# Patient Record
Sex: Female | Born: 1945
Health system: Southern US, Community
[De-identification: ages and names within clinical notes are randomized; demographics above are authoritative.]

## PROBLEM LIST (undated history)

## (undated) DIAGNOSIS — I10 Essential (primary) hypertension: Secondary | ICD-10-CM

## (undated) DIAGNOSIS — E785 Hyperlipidemia, unspecified: Secondary | ICD-10-CM

## (undated) DIAGNOSIS — IMO0002 Reserved for concepts with insufficient information to code with codable children: Secondary | ICD-10-CM

## (undated) DIAGNOSIS — R319 Hematuria, unspecified: Secondary | ICD-10-CM

## (undated) DIAGNOSIS — R3915 Urgency of urination: Secondary | ICD-10-CM

## (undated) DIAGNOSIS — K529 Noninfective gastroenteritis and colitis, unspecified: Secondary | ICD-10-CM

## (undated) DIAGNOSIS — J449 Chronic obstructive pulmonary disease, unspecified: Secondary | ICD-10-CM

## (undated) DIAGNOSIS — I493 Ventricular premature depolarization: Secondary | ICD-10-CM

## (undated) DIAGNOSIS — N993 Prolapse of vaginal vault after hysterectomy: Secondary | ICD-10-CM

## (undated) DIAGNOSIS — Z8551 Personal history of malignant neoplasm of bladder: Secondary | ICD-10-CM

## (undated) HISTORY — DX: Ventricular premature depolarization: I49.3

## (undated) HISTORY — DX: Chronic obstructive pulmonary disease, unspecified: J44.9

## (undated) HISTORY — DX: Personal history of malignant neoplasm of bladder: Z85.51

## (undated) HISTORY — PX: BREAST BIOPSY: SHX20

## (undated) HISTORY — DX: Noninfective gastroenteritis and colitis, unspecified: K52.9

## (undated) HISTORY — DX: Essential (primary) hypertension: I10

## (undated) HISTORY — DX: Prolapse of vaginal vault after hysterectomy: N99.3

## (undated) HISTORY — PX: ABDOMINAL HYSTERECTOMY: SHX81

---

## 1979-03-30 HISTORY — PX: BUNIONECTOMY: SHX129

## 1982-07-29 HISTORY — PX: TUBAL LIGATION: SHX77

## 2001-02-19 ENCOUNTER — Other Ambulatory Visit: Admission: RE | Admit: 2001-02-19 | Discharge: 2001-02-19 | Payer: Self-pay | Admitting: Obstetrics and Gynecology

## 2001-07-27 ENCOUNTER — Encounter: Payer: Self-pay | Admitting: Obstetrics and Gynecology

## 2001-07-27 ENCOUNTER — Ambulatory Visit (HOSPITAL_COMMUNITY): Admission: RE | Admit: 2001-07-27 | Discharge: 2001-07-27 | Payer: Self-pay | Admitting: Obstetrics and Gynecology

## 2001-08-05 ENCOUNTER — Encounter: Payer: Self-pay | Admitting: Obstetrics and Gynecology

## 2001-08-05 ENCOUNTER — Ambulatory Visit (HOSPITAL_COMMUNITY): Admission: RE | Admit: 2001-08-05 | Discharge: 2001-08-05 | Payer: Self-pay | Admitting: Obstetrics and Gynecology

## 2002-07-13 ENCOUNTER — Ambulatory Visit (HOSPITAL_COMMUNITY): Admission: RE | Admit: 2002-07-13 | Discharge: 2002-07-13 | Payer: Self-pay | Admitting: Obstetrics and Gynecology

## 2002-07-13 ENCOUNTER — Encounter: Payer: Self-pay | Admitting: Obstetrics and Gynecology

## 2003-07-15 ENCOUNTER — Ambulatory Visit (HOSPITAL_COMMUNITY): Admission: RE | Admit: 2003-07-15 | Discharge: 2003-07-15 | Payer: Self-pay | Admitting: Obstetrics and Gynecology

## 2004-03-13 ENCOUNTER — Ambulatory Visit (HOSPITAL_COMMUNITY): Admission: RE | Admit: 2004-03-13 | Discharge: 2004-03-13 | Payer: Self-pay | Admitting: Pulmonary Disease

## 2004-07-17 ENCOUNTER — Ambulatory Visit (HOSPITAL_COMMUNITY): Admission: RE | Admit: 2004-07-17 | Discharge: 2004-07-17 | Payer: Self-pay | Admitting: Pulmonary Disease

## 2005-07-18 ENCOUNTER — Ambulatory Visit (HOSPITAL_COMMUNITY): Admission: RE | Admit: 2005-07-18 | Discharge: 2005-07-18 | Payer: Self-pay | Admitting: Obstetrics and Gynecology

## 2006-07-28 ENCOUNTER — Ambulatory Visit (HOSPITAL_COMMUNITY): Admission: RE | Admit: 2006-07-28 | Discharge: 2006-07-28 | Payer: Self-pay | Admitting: Obstetrics and Gynecology

## 2006-10-02 ENCOUNTER — Ambulatory Visit (HOSPITAL_COMMUNITY): Admission: RE | Admit: 2006-10-02 | Discharge: 2006-10-02 | Payer: Self-pay | Admitting: Pulmonary Disease

## 2007-07-29 ENCOUNTER — Other Ambulatory Visit: Admission: RE | Admit: 2007-07-29 | Discharge: 2007-07-29 | Payer: Self-pay | Admitting: Obstetrics and Gynecology

## 2007-07-31 ENCOUNTER — Ambulatory Visit (HOSPITAL_COMMUNITY): Admission: RE | Admit: 2007-07-31 | Discharge: 2007-07-31 | Payer: Self-pay | Admitting: Pulmonary Disease

## 2008-08-03 ENCOUNTER — Other Ambulatory Visit: Admission: RE | Admit: 2008-08-03 | Discharge: 2008-08-03 | Payer: Self-pay | Admitting: Obstetrics and Gynecology

## 2008-08-15 ENCOUNTER — Ambulatory Visit (HOSPITAL_COMMUNITY): Admission: RE | Admit: 2008-08-15 | Discharge: 2008-08-15 | Payer: Self-pay | Admitting: Obstetrics and Gynecology

## 2009-08-28 ENCOUNTER — Ambulatory Visit (HOSPITAL_COMMUNITY): Admission: RE | Admit: 2009-08-28 | Discharge: 2009-08-28 | Payer: Self-pay | Admitting: Obstetrics & Gynecology

## 2009-12-11 ENCOUNTER — Ambulatory Visit (HOSPITAL_COMMUNITY): Admission: RE | Admit: 2009-12-11 | Discharge: 2009-12-11 | Payer: Self-pay | Admitting: Pulmonary Disease

## 2010-01-01 ENCOUNTER — Ambulatory Visit (HOSPITAL_COMMUNITY): Admission: RE | Admit: 2010-01-01 | Discharge: 2010-01-01 | Payer: Self-pay | Admitting: Pulmonary Disease

## 2010-06-15 ENCOUNTER — Other Ambulatory Visit: Admission: RE | Admit: 2010-06-15 | Discharge: 2010-06-15 | Payer: Self-pay | Admitting: Obstetrics and Gynecology

## 2010-08-07 ENCOUNTER — Ambulatory Visit (HOSPITAL_COMMUNITY)
Admission: RE | Admit: 2010-08-07 | Discharge: 2010-08-07 | Payer: Self-pay | Source: Home / Self Care | Attending: Pulmonary Disease | Admitting: Pulmonary Disease

## 2010-08-16 ENCOUNTER — Other Ambulatory Visit (HOSPITAL_COMMUNITY): Payer: Self-pay | Admitting: Pulmonary Disease

## 2010-08-16 DIAGNOSIS — Z0000 Encounter for general adult medical examination without abnormal findings: Secondary | ICD-10-CM

## 2010-08-29 ENCOUNTER — Ambulatory Visit (HOSPITAL_COMMUNITY): Payer: Self-pay

## 2010-08-30 ENCOUNTER — Ambulatory Visit (HOSPITAL_COMMUNITY): Admission: RE | Admit: 2010-08-30 | Payer: Medicare Other | Source: Home / Self Care | Admitting: Pulmonary Disease

## 2010-08-30 ENCOUNTER — Ambulatory Visit (HOSPITAL_COMMUNITY)
Admission: RE | Admit: 2010-08-30 | Discharge: 2010-08-30 | Disposition: A | Discharge status: Home / Self Care | Payer: Medicare Other | Source: Ambulatory Visit | Attending: Pulmonary Disease | Admitting: Pulmonary Disease

## 2010-08-30 DIAGNOSIS — Z0000 Encounter for general adult medical examination without abnormal findings: Secondary | ICD-10-CM

## 2010-08-30 DIAGNOSIS — Z1231 Encounter for screening mammogram for malignant neoplasm of breast: Secondary | ICD-10-CM | POA: Insufficient documentation

## 2011-01-31 ENCOUNTER — Emergency Department (HOSPITAL_COMMUNITY)
Admission: EM | Admit: 2011-01-31 | Discharge: 2011-01-31 | Disposition: A | Discharge status: Home / Self Care | Payer: No Typology Code available for payment source | Attending: Emergency Medicine | Admitting: Emergency Medicine

## 2011-01-31 ENCOUNTER — Emergency Department (HOSPITAL_COMMUNITY): Payer: No Typology Code available for payment source

## 2011-01-31 ENCOUNTER — Other Ambulatory Visit (HOSPITAL_COMMUNITY): Payer: Self-pay | Admitting: Pulmonary Disease

## 2011-01-31 DIAGNOSIS — M25559 Pain in unspecified hip: Secondary | ICD-10-CM | POA: Insufficient documentation

## 2011-01-31 DIAGNOSIS — E119 Type 2 diabetes mellitus without complications: Secondary | ICD-10-CM | POA: Insufficient documentation

## 2011-01-31 DIAGNOSIS — I10 Essential (primary) hypertension: Secondary | ICD-10-CM | POA: Insufficient documentation

## 2011-01-31 DIAGNOSIS — N63 Unspecified lump in unspecified breast: Secondary | ICD-10-CM

## 2011-01-31 DIAGNOSIS — M25569 Pain in unspecified knee: Secondary | ICD-10-CM | POA: Insufficient documentation

## 2011-01-31 DIAGNOSIS — M25519 Pain in unspecified shoulder: Secondary | ICD-10-CM | POA: Insufficient documentation

## 2011-01-31 DIAGNOSIS — Y92410 Unspecified street and highway as the place of occurrence of the external cause: Secondary | ICD-10-CM | POA: Insufficient documentation

## 2011-02-06 ENCOUNTER — Other Ambulatory Visit (HOSPITAL_COMMUNITY): Payer: Self-pay | Admitting: Pulmonary Disease

## 2011-02-06 ENCOUNTER — Ambulatory Visit (HOSPITAL_COMMUNITY)
Admission: RE | Admit: 2011-02-06 | Discharge: 2011-02-06 | Disposition: A | Discharge status: Home / Self Care | Payer: Medicare Other | Source: Ambulatory Visit | Attending: Pulmonary Disease | Admitting: Pulmonary Disease

## 2011-02-06 DIAGNOSIS — N63 Unspecified lump in unspecified breast: Secondary | ICD-10-CM

## 2011-02-20 ENCOUNTER — Ambulatory Visit (HOSPITAL_COMMUNITY)
Admission: RE | Admit: 2011-02-20 | Discharge: 2011-02-20 | Disposition: A | Discharge status: Home / Self Care | Payer: Medicare Other | Source: Ambulatory Visit | Attending: Pulmonary Disease | Admitting: Pulmonary Disease

## 2011-02-20 ENCOUNTER — Encounter (HOSPITAL_COMMUNITY): Payer: Self-pay | Admitting: Physical Therapy

## 2011-02-20 DIAGNOSIS — M25559 Pain in unspecified hip: Secondary | ICD-10-CM | POA: Insufficient documentation

## 2011-02-20 DIAGNOSIS — R262 Difficulty in walking, not elsewhere classified: Secondary | ICD-10-CM | POA: Insufficient documentation

## 2011-02-20 DIAGNOSIS — I10 Essential (primary) hypertension: Secondary | ICD-10-CM | POA: Insufficient documentation

## 2011-02-20 DIAGNOSIS — IMO0001 Reserved for inherently not codable concepts without codable children: Secondary | ICD-10-CM | POA: Insufficient documentation

## 2011-02-20 DIAGNOSIS — M545 Low back pain, unspecified: Secondary | ICD-10-CM | POA: Insufficient documentation

## 2011-02-20 DIAGNOSIS — M6281 Muscle weakness (generalized): Secondary | ICD-10-CM | POA: Insufficient documentation

## 2011-02-20 DIAGNOSIS — M25551 Pain in right hip: Secondary | ICD-10-CM | POA: Insufficient documentation
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
## 2011-02-26 ENCOUNTER — Encounter (INDEPENDENT_AMBULATORY_CARE_PROVIDER_SITE_OTHER): Payer: Self-pay | Admitting: Surgery

## 2011-02-26 ENCOUNTER — Other Ambulatory Visit (INDEPENDENT_AMBULATORY_CARE_PROVIDER_SITE_OTHER): Payer: Self-pay | Admitting: Surgery

## 2011-02-26 ENCOUNTER — Ambulatory Visit (HOSPITAL_COMMUNITY)
Admission: RE | Admit: 2011-02-26 | Discharge: 2011-02-26 | Disposition: A | Discharge status: Home / Self Care | Payer: Medicare Other | Source: Ambulatory Visit | Attending: Pulmonary Disease | Admitting: Pulmonary Disease

## 2011-02-26 ENCOUNTER — Ambulatory Visit (INDEPENDENT_AMBULATORY_CARE_PROVIDER_SITE_OTHER): Payer: Medicare Other | Admitting: Surgery

## 2011-02-26 VITALS — BP 160/96 | HR 100 | Temp 97.5°F | Ht 65.0 in | Wt 158.2 lb

## 2011-02-26 DIAGNOSIS — N6320 Unspecified lump in the left breast, unspecified quadrant: Secondary | ICD-10-CM

## 2011-02-26 DIAGNOSIS — N63 Unspecified lump in unspecified breast: Secondary | ICD-10-CM | POA: Insufficient documentation

## 2011-02-26 DIAGNOSIS — K529 Noninfective gastroenteritis and colitis, unspecified: Secondary | ICD-10-CM | POA: Insufficient documentation

## 2011-02-26 DIAGNOSIS — E1122 Type 2 diabetes mellitus with diabetic chronic kidney disease: Secondary | ICD-10-CM | POA: Insufficient documentation

## 2011-02-26 DIAGNOSIS — I10 Essential (primary) hypertension: Secondary | ICD-10-CM | POA: Insufficient documentation

## 2011-02-26 NOTE — Progress Notes (Signed)
CC: Breast/chest wall mass HPI: This patient had a recent mammogram an abnormality was seen in the upper inner quadrant of the breast, and she'll most of the actual breast tissue. It was biopsied with a fine needle aspiration biopsy. A core biopsy couldn't be done because it was close to an artery. The biopsy showed some lymphatic tissue and there was some mild suspicion that this was abnormal so an excisional biopsy was recommended. She comes here to discuss that. She is not able to feel a mass. She's had no prior breast problems or symptoms.  ROS: Her 15 point review of systems was filled out and reviewed. Pertinent positives include some history of hypertension and type 2 diabetes. She's had a little bowel syndrome and possibly inflammatory bowel disease. She sits in sinus issues and worse partial plates.  MEDS: Current outpatient prescriptions:ALPRAZolam (XANAX) 1 MG tablet, Take 1 mg by mouth 3 (three) times daily as needed.  , Disp: , Rfl: ;  amLODipine (NORVASC) 10 MG tablet, Take 10 mg by mouth daily.  , Disp: , Rfl: ;  aspirin 81 MG EC tablet, Take 81 mg by mouth daily.  , Disp: , Rfl: ;  Calcium Carbonate-Vitamin D (CALCIUM 500 + D PO), Take by mouth 3 (three) times daily.  , Disp: , Rfl:  loratadine (CLARITIN) 5 MG chewable tablet, Chew 5 mg by mouth daily.  , Disp: , Rfl: ;  mesalamine (ASACOL) 400 MG EC tablet, Take 400 mg by mouth 2 (two) times daily.  , Disp: , Rfl: ;  metFORMIN (GLUCOPHAGE-XR) 500 MG 24 hr tablet, Take 500 mg by mouth daily with breakfast.  , Disp: , Rfl: ;  Multiple Vitamins-Minerals (CENTRUM SILVER PO), Take by mouth daily.  , Disp: , Rfl:  pravastatin (PRAVACHOL) 40 MG tablet, Take 40 mg by mouth daily.  , Disp: , Rfl: ;  ramipril (ALTACE) 10 MG capsule, Take 10 mg by mouth daily.  , Disp: , Rfl:        ALLERGIES: Allergies  Allergen Reactions  . Ceftin    Past Medical History  Diagnosis Date  . Low back pain   . Hip pain, right   . Fatigue   .  Hypertension   . IBD (inflammatory bowel disease)   . Allergy     Past Surgical History  Procedure Date  . Tubal ligation   . Bunionectomy       PE GENERAL: The patient is alert, oriented, and generally healthy-appearing, NAD. Mood and affect are normal.  HEENT: The head is normocephalic, the eyes nonicteric, the pupils were round regular and equal. EOMs are normal. Pharynx normal. Dentition good.  NECK: The neck is supple and there are no masses or thyromegaly.  LUNGS: Normal respirations and clear to auscultation.  HEART: Regular rhythm, with no murmurs rubs or gallops. Pulses are intact carotid dorsalis pedis and posterior tibial. No significant varicosities are noted.    BREASTS: Breasts are symmetrical appearance and there are no suspicious areas. The area in question from the biopsy as any anterior chest wall upper aspect of the breast and there is no palpable mass there.  ABDOMEN: Soft, flat, and nontender. No masses or organomegaly is noted. No hernias are noted. Bowel sounds are normal.  EXTREMITIES: Good range of motion, no edema.   Data Reviewed I have reviewed the notes from Dr. Verita Lamb office as well as her mammogram report ultrasound report and biopsy report  Assessment Lesion of left anterior chest/breast of uncertain etiology,  likely lymphatic in origin, hopefully been on  Plan Excisional biopsy is appropriate. This will need a guidewire or marking with ultrasound guidance to be sure we get the correct area absence nothing is palpable. She went ahead and set this up.

## 2011-02-26 NOTE — Progress Notes (Addendum)
Physical Therapy Treatment Patient Name: April Gomez [id number] Date: 02/26/2011  Time In: 8:47 Time Out: 9:50 Visit #: 2/2 Next Re-eval: 03/20/2011 Charge: Gait x 13 min Therex 35 min MHP 10 min  Subjective: Symptoms/Limitations Symptoms: 8/10 LBP/R hip radiating pain Pain Assessment Pain Score:   8 Pain Location: Back Pain Orientation: Right Multiple Pain Sites: Yes  Objective: Rigid stance, antalgic gait flat foot  Exercise/Treatments TM.8x 3' Gait for step thru pattern with vc for heel toe, equal stride length STANDING: Hay bale 5x SUPINE: Hs st 3x 30" LTK 5x 10" SKTC 5x 10" Bridge 10x Abd iso 5x5" SITTING: AP, RL hip rotation on theraball   Lumbar Stretches Active Hamstring Stretch: 3 reps;30 seconds Single Knee to Chest Stretch: 5 reps;30 seconds Lower Trunk Rotation: 5 reps;10 seconds Stability Exercises Clam: 10 reps;Supine Bridge: 10 reps Functional Squats: 10 reps Hip Stretches Active Hamstring Stretch: 3 reps;30 seconds Knee Stretches Active Hamstring Stretch: 3 reps;30 seconds    Goals   End of Session Patient Active Problem List  Diagnoses  . Low back pain  . Hip pain, right   PT - End of Session Activity Tolerance: Patient limited by pain General Behavior During Session: [facility] for tasks performed Cognition: [facility] for tasks performed PT Assessment and Plan Clinical Impression Statement: Pt with rigid posture with gait, multimodal cueing for proper sequence with gait.  Pt tolerated well with total tx with R LE radiating pain decrease following MHP at end of session. PT Plan: Add estim and/or manual techniques to decrease pain, add standing hip flexor strength, progress strength and reduce pain.  Juel Burrow 02/26/2011, 11:53 AM

## 2011-02-26 NOTE — Patient Instructions (Signed)
We will schedule surgery for you to have this area and the left breast chest wall area removed. This will be outpatient surgery. We'll send the tissue to the pathologist to find out what it is.

## 2011-02-27 ENCOUNTER — Telehealth (HOSPITAL_COMMUNITY): Payer: Self-pay | Admitting: Physical Therapy

## 2011-02-28 ENCOUNTER — Ambulatory Visit (HOSPITAL_COMMUNITY)
Admission: RE | Admit: 2011-02-28 | Discharge: 2011-02-28 | Disposition: A | Discharge status: Home / Self Care | Payer: No Typology Code available for payment source | Source: Ambulatory Visit | Attending: Pulmonary Disease | Admitting: Pulmonary Disease

## 2011-02-28 DIAGNOSIS — R262 Difficulty in walking, not elsewhere classified: Secondary | ICD-10-CM | POA: Insufficient documentation

## 2011-02-28 DIAGNOSIS — M545 Low back pain, unspecified: Secondary | ICD-10-CM | POA: Insufficient documentation

## 2011-02-28 DIAGNOSIS — IMO0001 Reserved for inherently not codable concepts without codable children: Secondary | ICD-10-CM | POA: Insufficient documentation

## 2011-02-28 DIAGNOSIS — I10 Essential (primary) hypertension: Secondary | ICD-10-CM | POA: Insufficient documentation

## 2011-02-28 DIAGNOSIS — M6281 Muscle weakness (generalized): Secondary | ICD-10-CM | POA: Insufficient documentation

## 2011-02-28 DIAGNOSIS — M25559 Pain in unspecified hip: Secondary | ICD-10-CM | POA: Insufficient documentation

## 2011-02-28 NOTE — Progress Notes (Signed)
Physical Therapy Treatment Patient Name: April Gomez [id number] Date: 02/28/2011  Time in:  9:56 Time out: 10:36 Visits #: 3/3 Re-eval due:  03/20/11 Charges: therex 25', moist heat, gait 5'   SUBJECTIVE:: Symptoms/Limitations Symptoms: 7-8/10 pain today that continues to radiate into R hip Pain Assessment Currently in Pain?: Yes Pain Score:   8 Pain Location: Back Pain Orientation: Right Pain Radiating Towards: R hip Multiple Pain Sites: No  Precautions/Restrictions:  None noted        OBJECTIVE: Exercise/Treatments  TM  0.71mph x 5'  Gait for step thru pattern with vc for heel-toe gait and  equal stride length  STANDING: (standing exercises not done today due to time) Hay bale 5x  Functional squats 10X SUPINE:  Hamstring st 3x 30"  Trunk rotation 5x 10" each SKTC 5x 10" each Bridge 10x  Abd iso 10x5"  SITTING:  AP, RL hip rotation on theraball   Modalities Modalities: Moist Heat Moist Heat Therapy Number Minutes Moist Heat: 25 Minutes Moist Heat Location: Other (comment) (Low Back)  Goals   End of Session Patient Active Problem List  Diagnoses  . Low back pain  . Hip pain, right  . Breast mass in female  . BP (high blood pressure)  . IBD (inflammatory bowel disease)  . DM II (diabetes mellitus, type II), controlled   PT - End of Session Activity Tolerance: Patient tolerated treatment well General Behavior During Session: WFL for tasks performed Cognition: [facility] for tasks performed PT Assessment and Plan Clinical Impression Statement: Continued rigid trunk and flat foot with gait on treadmill, however improved stride length today as compared to last visit with step-to gait with R LE.  Unable to complete full program due to time constraints. PT Plan: Resume standing therex next visit.  Begin stair training secondary to currently using step-to pattern to descend/ascend stairs.  Bascom Levels, Johnathon Olden B 02/28/2011, 10:26 AM

## 2011-03-05 ENCOUNTER — Ambulatory Visit (HOSPITAL_COMMUNITY)
Admission: RE | Admit: 2011-03-05 | Discharge: 2011-03-05 | Disposition: A | Discharge status: Home / Self Care | Payer: No Typology Code available for payment source | Source: Ambulatory Visit | Attending: Pulmonary Disease | Admitting: Pulmonary Disease

## 2011-03-05 NOTE — Progress Notes (Signed)
Physical Therapy Treatment Patient Name: April Gomez Date: 03/05/2011  Time in: 8:03am Time out: 8:37am Visits #: 4/4 Re-eval due: 03/20/11  Charges: therex 25', gait 8'   Held MHP secondary to no pain currently.  SUBJECTIVE: Symptoms/Limitations Symptoms: No pain so far today.  the right hip just catches sometimes (with weight-bearing) Pain Assessment Currently in Pain?: No/denies Multiple Pain Sites: No  Precautions/Restrictions :  None noted     OBJECTIVE:  Exercise/Treatments  TM 1.1 mph x 5'  Gait for step thru pattern with vc for heel-toe gait and equal stride length  STANDING Heelraises 10X Toeraises 10X  Hay bale 5x  Functional squats 15X  [place] 1 flight, reciprocally with Gomez HR's SUPINE:  Hamstring st 3x 30"  Trunk rotation 5x 10" each  SKTC 5x 10" each  Bridge 15x  Ball abdominal rectus 10X Ball abdominal obliques 10Xea SITTING:  AP, RL hip rotation on theraball  LAQ on theraball 5X Marching on theraball 5X     End of Session Patient Active Problem List  Diagnoses  . Low back pain  . Hip pain, right  . Breast mass in female  . BP (high blood pressure)  . IBD (inflammatory bowel disease)  . DM II (diabetes mellitus, type II), controlled   PT - End of Session Activity Tolerance: Patient tolerated treatment well General Behavior During Session: WFL for tasks performed Cognition: [facility] for tasks performed PT Assessment and Plan Clinical Impression Statement: Pt. able to increase speed on treadmill, displayed less rigidity of LE's, however still difficulty with step thru heel-toe gait.  Pt. required constant tactile cues to retain erect posture.  Began ball abdominal therex and seated stability therex on ball with cues.  Pt. able to ascend/descend stairs with Gomez UE and increased time/cues. PT Treatment/Interventions: Therapeutic exercise;Gait training PT Plan: Overall decreased pain with improved mobility.  continue to progress towards  goals.  April Gomez, April Gomez 03/05/2011, 8:39 AM

## 2011-03-07 ENCOUNTER — Ambulatory Visit (HOSPITAL_COMMUNITY)
Admission: RE | Admit: 2011-03-07 | Discharge: 2011-03-07 | Disposition: A | Discharge status: Home / Self Care | Payer: No Typology Code available for payment source | Source: Ambulatory Visit | Attending: Physical Therapy | Admitting: Physical Therapy

## 2011-03-07 NOTE — Progress Notes (Signed)
Physical Therapy Treatment Patient Name: April Gomez Date: 03/07/2011 Time in: 8:04 - 845 Visits #: 5/5  Re-eval due: 03/20/11  Charges: therex 21', gait 10', manual 10 HPI: Symptoms/Limitations Symptoms: "I am having just a bit of pain about a 7/10.  It is more to my R hip and not so much in my back anymore"  Pt has slight decrease in gait speed and decreased stance on the RLE Pain Assessment Pain Score:   7 Pain Location: Hip Pain Orientation: Right Multiple Pain Sites: No  Exercise/Treatments Walking in hospital hallway w/cueing for gait sequence - pt with appropriate speed and intermittent holding R hip.   Gait for step thru pattern with vc for heel-toe gait and equal stride length  STANDING  Heelraises 20X  Toeraises 15X  Hay bale 10x  Functional squats 20X   Standing Hip flexor stretch 3x30 sec  Retro walking 2 RT for gait training SUPINE:  Hamstring st 3x 30"  Piriformis Stretch w/assistance 3x30 sec RLE adductor st. 3x30 sec ITB stretch w/assistance 3x30 sec MANUAL SCS to R: piriformis, glute minimus, TFL and iliopsoas w/STM after x10 minutes   Goals PT Short Term Goals Short Term Goal 1: 1.Pt will be Independent in HEP in order to maximize therapeutic effect in 2 wks Short Term Goal 1 Progress: Progressing toward goal Short Term Goal 2: 2.Pt will report pain less than 4/10 for 50% of her day in 2 wks Short Term Goal 2 Progress: Not met Short Term Goal 3: 3.Pt will increase LE and core strength by 1 muscle grade in 2 wks Short Term Goal 3 Progress: Progressing toward goal Short Term Goal 4: 4.Pt will demonstrate proper body mechanics with functional lifting activities in 2 wks Short Term Goal 4 Progress: Not met PT Long Term Goals Long Term Goal 1: 1.Pt will report pain less than or equal to 3/10 for 75% while exercising in 8 wks Long Term Goal 1 Progress: Not met Long Term Goal 2: 2.Pt will improve LE and core strength to WNL in order to tolerate  standing and walking for an hour in order to participate in walking for exercise in 8 wks Long Term Goal 2 Progress: Progressing toward goal Long Term Goal 3: 3.Pt will improve dynamic balance by gait on uneven surface x1000 ft to improve safety with outdoor activities in 8 wks Long Term Goal 3 Progress: Progressing toward goal Long Term Goal 4: 4.Pt will improve LE strength in order to ascend and descend stairs with a reciprocal gait with 1 handrail with appropriate mechanics in 8 wks Long Term Goal 4 Progress: Progressing toward goal End of Session Patient Active Problem List  Diagnoses  . Low back pain  . Hip pain, right  . Breast mass in female  . BP (high blood pressure)  . IBD (inflammatory bowel disease)  . DM II (diabetes mellitus, type II), controlled    ASSESSMENT: Pt improved cadance when ambulating in hallway and had mild increase in pain to her TFL after ambulation. Pt reported decrease in pain after manual techniques and ther-ex today. Pt continues to have impaired upper level balance and decreased hip extension.  PLAN: Cont to progress   Izan Miron 03/07/2011, 2:22 PM

## 2011-03-12 ENCOUNTER — Ambulatory Visit (HOSPITAL_COMMUNITY)
Admission: RE | Admit: 2011-03-12 | Discharge: 2011-03-12 | Disposition: A | Discharge status: Home / Self Care | Payer: No Typology Code available for payment source | Source: Ambulatory Visit | Attending: Physical Therapy | Admitting: Physical Therapy

## 2011-03-12 NOTE — Progress Notes (Addendum)
Physical Therapy Treatment Patient Name: April Gomez [id number] Date: 03/12/2011 Time in: 8:05 -  8:47 Visits #: 6/6  Re-eval due: 03/20/11  Charges: therex 24', gait 8', manual 10'  HPI: Symptoms/Limitations Symptoms: Pt reports she is doing a lot of her exercises at home.  She had to take her grandson to school yesterday and did a lot of walking so she is sore today.  Pain 7/10.  Pt ambulates without hand on hip and with improved stride length.    Exercise/Treatments Walking in hospital hallway and outside w/cueing for gait sequence and cadence x8 min.  Gait for step thru pattern with vc for heel-toe gait and equal stride length  SITTING:  Piriformis Stretch 3x30 sec STANDING   Heelraises 2X10   Toeraises 2X10   Standing Hip flexor stretch 6x30 sec (3x30 while walking) SUPINE:   Piriformis Stretch w/assistance 3x30 sec   BLE adductor st. 3x30 sec   BLE Hip Abd to Add w/min man resistance w/knee bent 10x  MANUAL   BLE: SCS iliopsoas w/STM after; Passive hip circumduction with diagnoal patterns to increase joint mobility    Manual Therapy Manual Therapy: Other (comment) Other Manual Therapy: SCS iliopsoas w/STM after; Passive hip circumduction with diagnoal patterns to increase joint mobility x10 min  Goals PT Short Term Goals Short Term Goal 1: 1.Pt will be Independent in HEP in order to maximize therapeutic effect in 2 wks Short Term Goal 1 Progress: Met Short Term Goal 2: 2.Pt will report pain less than 4/10 for 50% of her day in 2 wks Short Term Goal 2 Progress: Not met Short Term Goal 3: 3.Pt will increase LE and core strength by 1 muscle grade in 2 wks Short Term Goal 3 Progress: Progressing toward goal Short Term Goal 4: 4.Pt will demonstrate proper body mechanics with functional lifting activities in 2 wks Short Term Goal 4 Progress: Progressing toward goal PT Long Term Goals Long Term Goal 1: 1.Pt will report pain less than or equal to 3/10 for 75% while  exercising in 8 wks Long Term Goal 1 Progress: Not met Long Term Goal 2: 2.Pt will improve LE and core strength to WNL in order to tolerate standing and walking for an hour in order to participate in walking for exercise in 8 wks Long Term Goal 2 Progress: Not met Long Term Goal 3: 3.Pt will improve dynamic balance by gait on uneven surface x1000 ft to improve safety with outdoor activities in 8 wks Long Term Goal 3 Progress: Progressing toward goal Long Term Goal 4: 4.Pt will improve LE strength in order to ascend and descend stairs with a reciprocal gait with 1 handrail with appropriate mechanics in 8 wks Long Term Goal 4 Progress: Progressing toward goal End of Session Patient Active Problem List  Diagnoses  . Low back pain  . Hip pain, right  . Breast mass in female  . BP (high blood pressure)  . IBD (inflammatory bowel disease)  . DM II (diabetes mellitus, type II), controlled   PT - End of Session Activity Tolerance: Patient tolerated treatment well PT Assessment and Plan Clinical Impression Statement: Pt had decreased pain and improved Bil hip ROM after ther-ex and manual techniques today.  Pt continues to have difficulty relaxing and increased grimmace when going into hip flexion.  She was able to perfrom Ab set with minimal cueing.  PT Plan: Cont to progress. Add balance, and steps next visit.  Re-eval in 2 visits, schedule w/Jerman Tinnon  Yehya Brendle  03/12/2011, 9:03 AM

## 2011-03-13 ENCOUNTER — Ambulatory Visit (HOSPITAL_COMMUNITY)
Admission: RE | Admit: 2011-03-13 | Discharge: 2011-03-13 | Disposition: A | Discharge status: Home / Self Care | Payer: Medicare Other | Source: Ambulatory Visit | Attending: Surgery | Admitting: Surgery

## 2011-03-13 ENCOUNTER — Encounter (HOSPITAL_COMMUNITY)
Admission: RE | Admit: 2011-03-13 | Discharge: 2011-03-13 | Disposition: A | Discharge status: Home / Self Care | Payer: Medicare Other | Source: Ambulatory Visit | Attending: Surgery | Admitting: Surgery

## 2011-03-13 ENCOUNTER — Other Ambulatory Visit (INDEPENDENT_AMBULATORY_CARE_PROVIDER_SITE_OTHER): Payer: Self-pay | Admitting: Surgery

## 2011-03-13 DIAGNOSIS — R222 Localized swelling, mass and lump, trunk: Secondary | ICD-10-CM

## 2011-03-13 DIAGNOSIS — Z01812 Encounter for preprocedural laboratory examination: Secondary | ICD-10-CM | POA: Insufficient documentation

## 2011-03-13 DIAGNOSIS — Z01810 Encounter for preprocedural cardiovascular examination: Secondary | ICD-10-CM | POA: Insufficient documentation

## 2011-03-13 DIAGNOSIS — Z01818 Encounter for other preprocedural examination: Secondary | ICD-10-CM | POA: Insufficient documentation

## 2011-03-13 LAB — BASIC METABOLIC PANEL
BUN: 16 mg/dL (ref 6–23)
CO2: 30 mEq/L (ref 19–32)
Calcium: 10.1 mg/dL (ref 8.4–10.5)
GFR calc non Af Amer: >60 mL/min (ref >60)
Glucose, Bld: 136 mg/dL — ABNORMAL HIGH (ref 70–99)
Potassium: 3.4 mEq/L — ABNORMAL LOW (ref 3.5–5.1)

## 2011-03-13 LAB — CBC
HCT: 38.4 % (ref 36.0–46.0)
Hemoglobin: 12.7 g/dL (ref 12.0–15.0)
MCH: 29.3 pg (ref 26.0–34.0)
MCHC: 33.1 g/dL (ref 30.0–36.0)
RBC: 4.33 MIL/uL (ref 3.87–5.11)

## 2011-03-14 ENCOUNTER — Telehealth (INDEPENDENT_AMBULATORY_CARE_PROVIDER_SITE_OTHER): Payer: Self-pay | Admitting: General Surgery

## 2011-03-14 ENCOUNTER — Ambulatory Visit (HOSPITAL_COMMUNITY)
Admission: RE | Admit: 2011-03-14 | Discharge: 2011-03-14 | Disposition: A | Discharge status: Home / Self Care | Payer: No Typology Code available for payment source | Source: Ambulatory Visit

## 2011-03-14 NOTE — Telephone Encounter (Signed)
Pt and cone pre op notifi4ed labs ok for surgery per dr streck// [id number] eh

## 2011-03-14 NOTE — Progress Notes (Signed)
Physical Therapy Treatment Patient Details  Name: April Gomez MRN: [id number] Date of Birth: [birth year]  Today's Date: 03/14/2011 Time: 4098-1191 Time Calculation (min): 46 min Visit#: 7 of 7 Re-eval: 03/20/11 Charge: therex 25 min Manual 10 min Gait: 10 min  Subjective: Symptoms/Limitations Symptoms: Pt  reports R hip pain of 5/10 following reciprocal stairs gait training. Pain Assessment Currently in Pain?: Yes Pain Score:   5 Pain Location: Hip Pain Orientation: Right   Exercise/Treatments Walking in hospital hallway and outside w/cueing for gait sequence and cadence x46min.  Gait for step thru pattern with vc for heel-toe gait and equal stride length  Gait training for reciprocal gait with stairs 1RT STANDING  Heelraises 2X10  Toeraises 2X10  Standing Hip flexor stretch 6x30 sec (3x30 while walking)  L7", R12" Tandem stance 3x 30" SUPINE:  Piriformis Stretch w/assistance 3x30 sec  Sidelying:  B LE hip abd 10x B LE hip add 10x  MANUAL  BLE: SCS iliopsoas w/STM after; Passive hip circumduction with diagnoal patterns to increase joint mobility     Physical Therapy Assessment and Plan PT Assessment and Plan Clinical Impression Statement: Began gait indoor/outdoor with incline/decline slopes and amb through grass.  Pt required vc for reciprocal gait with acsend and descending stairs. PT Plan: Re-eval next treatment.    Goals    Problem List Patient Active Problem List  Diagnoses  . Low back pain  . Hip pain, right  . Breast mass in female  . BP (high blood pressure)  . IBD (inflammatory bowel disease)  . DM II (diabetes mellitus, type II), controlled    PT - End of Session Activity Tolerance: Patient tolerated treatment well General Behavior During Session: [facility] for tasks performed Cognition: [facility] for tasks performed  Juel Burrow 03/14/2011, 4:34 PM

## 2011-03-18 ENCOUNTER — Ambulatory Visit (HOSPITAL_COMMUNITY)
Admission: RE | Admit: 2011-03-18 | Discharge: 2011-03-18 | Disposition: A | Discharge status: Home / Self Care | Payer: No Typology Code available for payment source | Source: Ambulatory Visit | Attending: Physical Therapy | Admitting: Physical Therapy

## 2011-03-18 NOTE — Progress Notes (Addendum)
Physical Therapy Treatment/RE-Evaluation Patient Details  Name: RAYHANA SLIDER MRN: [id number] Date of Birth: 1946/05/25  Today's Date: 03/18/2011 Time: 1478-2956 Time Calculation (min): 50 min Charges: 1 MMT, 1 ROM, 1 E-stim, 15 min TE Visit#: 8 of 8. , 0 of 8 for next re-eval Re-eval: 04/15/11  Subjective: Symptoms/Limitations Symptoms: Re-eval completed today.  Pt reports that she is having some surgery on her chest on Wedensday.  Overall with therapy she feels that her RLE is still just weak and has impaired balance which is limiting her ability to be at 100%.  Pt reports she has improved by 60%.   How long can you sit comfortably?: greater than 30 minutes with increased movement.   How long can you stand comfortably?: 15-20 minutes  How long can you walk comfortably?: 25-30 minutes with therapy.   Pain Assessment Currently in Pain?: Yes Pain Score:   6 Pain Location: Hip Pain Orientation: Right   Exercise/Treatments Seated  Piriformis Stretch 3x30 sec Supine  Thomas Stretch BLE 3x30 sec w/overpressure for Quad Single Knee to Chest Stretch: 3 reps;30 seconds Double Knee to Chest Stretch: 30 seconds;3 reps Quad Stretch: 3 reps;30 seconds;Other (comment)  Modalities Modalities: Electrical Stimulation;Moist Heat Moist Heat Therapy Number Minutes Moist Heat: 15 Minutes Moist Heat Location: Other (comment) (R Hip) Electrical Stimulation Electrical Stimulation Location: R hip pt in L S/L position Electrical Stimulation Action: IFES x15 minutes Electrical Stimulation Goals: Pain  Physical Therapy Assessment and Plan PT Assessment and Plan Clinical Impression Statement: Ms Forrey was referred to PT secondary to LBP and hip pain.  Pt has made moderate progress in overall with strength and ROM and improved her overall functional ability.  Pt continues to have impairments including increased pain, slow movements, impaired balance and decreased muscular endurance and will  continue to benefit from skilled outpatient therapy to address the above impairments.   Rehab Potential: Good PT Frequency: Min 2X/week PT Duration: 4 weeks PT Treatment/Interventions: Gait training;Balance training;Therapeutic exercise;Other (comment) (Modalities for pain control) PT Plan: Continue to address pain and difficulty with balance.  Add retro gait, tandem gait, heel walking, toe walking.  F/U on modalities use     Physical Therapy Medicare Recertification Patient: Ligaya Cormier Initial Eval Date: 02/20/11  Physician: Juanetta Gosling DOB: 06/01/1946  Age: 65 Diagnosis: Low Back Pain, R hip pain, hip stiffness  Onset Date: 01/31/11 Dx Code: 724.2, 719.45, 719.55  Employer: Retired Date of Surgery: N/A  Occupation:  Case Manager: N/A  Next MD visit: 2 months Claim #: N/A   Patient seen for 8 sessions  Subjective:    Patient's response to therapy: Pt continues to be limited to increased pain to her R hip.  Reports that she has improved by 60%.    Objective:  (   )  = initial Evaluation Measurements  CURRENT CONDITION: Pain: 6/10 50% of her day (was 8/10)   OBSERVATION: Continues to guard LLE, but demonstrates awareness of improper posture.) (Cautious with sit to stand favors RLE AROM:  R hip flexion 120 degrees (90 degrees)  Lumbar ROM: WNL w/slow movement , less guarding  (decreased with all motions by 50% w/slow guarded movement).   STRENGTH:   Lower Extremity Right Left  Hip flexion 4/5(3+/5) 5/5 (5/5)  Glute Med 4/5 (3-/5)  4/5 (NT)  Glute Max 3/5 (3+/5) 3+/5 (NT)  Hip adductors 4/5 (4/5) 4/5 (NT)  Quadriceps 5/5 (4/5) 5/5  Hamstrings 5/5 (4/5) 5/5  Lumbar 4/5 (3/5)    Abdominals 4/5 (3/5)  GAIT: Improved cadence, with mild antalgic gait with decreased R stance time. (Antalgic gait with cautious movement with decreased heel strike, stance time and toe off during RLE stance phase) FUNCTION:  Continues to have difficulty with balance. L SLS: 7 Sec, R SLS: 12 sec.  (Balance:  unable to SLS.)   PALPATION: R lateral hip pain and tenderness with moderate palpation     Assessment:   Summary/analysis of evaluation: Ms Alameda was referred to PT secondary to LBP and hip pain.  Pt has made moderate progress in overall with strength and ROM and improved her overall functional ability.  Pt continues to have impairments including increased pain, slow movements, impaired balance and decreased muscular endurance and will continue to benefit from skilled outpatient therapy to address the above impairments.    Plan:   Goals PT Short Term Goals Met 4 of 4 STG PT Long Term Goals: Continue for 4 more weeks PT Long Term Goal 1: 1.Pt will report pain less than or equal to 3/10 for 75% while exercising in 8 wks PT Long Term Goal 1 - Progress: Not met PT Long Term Goal 2: 2.Pt will improve LE and core strength to WNL in order to tolerate standing and walking for an hour in order to participate in walking for exercise in 8 wks PT Long Term Goal 2 - Progress: Partly met Long Term Goal 3: 3.Pt will improve dynamic balance by gait on uneven surface x1000 ft to improve safety with outdoor activities in 8 wks Long Term Goal 3 Progress: Progressing toward goal Long Term Goal 4: 4.Pt will improve LE strength in order to ascend and descend stairs with a reciprocal gait with 1 handrail with appropriate mechanics in 8 wks Long Term Goal 4 Progress: Progressing toward goal   Plan Treatment plan with rationale: Therapeutic Exercise, Balance Re-training, Gait Training, Modalities and Manual Therapy for ROM and pain control  Recommendations: 2x/wk x4 wks to continue with LTG    Zoee Heeney 03/18/2011, 10:04 AM

## 2011-03-20 ENCOUNTER — Ambulatory Visit
Admission: RE | Admit: 2011-03-20 | Discharge: 2011-03-20 | Disposition: A | Discharge status: Home / Self Care | Payer: Medicare Other | Source: Ambulatory Visit | Attending: Surgery | Admitting: Surgery

## 2011-03-20 ENCOUNTER — Ambulatory Visit (HOSPITAL_COMMUNITY)
Admission: RE | Admit: 2011-03-20 | Discharge: 2011-03-20 | Disposition: A | Discharge status: Home / Self Care | Payer: Medicare Other | Source: Ambulatory Visit | Attending: Surgery | Admitting: Surgery

## 2011-03-20 ENCOUNTER — Other Ambulatory Visit (INDEPENDENT_AMBULATORY_CARE_PROVIDER_SITE_OTHER): Payer: Self-pay | Admitting: Surgery

## 2011-03-20 DIAGNOSIS — E119 Type 2 diabetes mellitus without complications: Secondary | ICD-10-CM | POA: Insufficient documentation

## 2011-03-20 DIAGNOSIS — K589 Irritable bowel syndrome without diarrhea: Secondary | ICD-10-CM | POA: Insufficient documentation

## 2011-03-20 DIAGNOSIS — R599 Enlarged lymph nodes, unspecified: Secondary | ICD-10-CM | POA: Insufficient documentation

## 2011-03-20 DIAGNOSIS — N6320 Unspecified lump in the left breast, unspecified quadrant: Secondary | ICD-10-CM

## 2011-03-20 DIAGNOSIS — D249 Benign neoplasm of unspecified breast: Secondary | ICD-10-CM

## 2011-03-20 DIAGNOSIS — I10 Essential (primary) hypertension: Secondary | ICD-10-CM | POA: Insufficient documentation

## 2011-03-20 LAB — GLUCOSE, CAPILLARY: Glucose-Capillary: 177 mg/dL — ABNORMAL HIGH (ref 70–99)

## 2011-03-21 NOTE — Op Note (Signed)
  NAMECYANA, April Gomez              ACCOUNT NO.:  [id number]  MEDICAL RECORD NO.:  [id number]  LOCATION:  [facility]                         FACILITY:  [facility]  PHYSICIAN:  Currie Paris, M.D.DATE OF BIRTH:  16-Dec-1945  DATE OF PROCEDURE:  03/20/2011 DATE OF DISCHARGE:                              OPERATIVE REPORT   PREOPERATIVE DIAGNOSIS:  Left breast mass not palpable upper inner quadrant.  POSTOPERATIVE DIAGNOSIS:  Left breast mass not palpable upper inner quadrant.  PROCEDURE:  Excision of left breast mass surgery.  SURGEON:  Currie Paris, MD  ANESTHESIA:  General.  CLINICAL HISTORY:  This is a 65 year old lady with a small mass in the upper inner quadrant very superficial and near an artery and not amenable for core biopsy.  An FNA had been done, but was not conclusive, an excisional biopsy recommended.  DESCRIPTION OF PROCEDURE:  I saw the patient in the holding area and I confirmed the plans for the procedure as noted above.  Ultrasound had been done and the overlying skin had been marked.  The left breast was initialed as the operative site.  The patient was taken to the operating room and given general anesthesia.  The area in question was prepped and draped.  The time-out was done.  I made a transverse incision directly over on the spot.  I raised a skin flap about 1.5 cm superiorly, inferiorly, medially and laterally from the incision.  I divided the breast tissue medially down to the chest wall and inserted inferiorly and superiorly and came around the area in question and then started raising off of the muscle.  A perforator was identified and suture ligated.  I then completed the excision by going down to chest wall all the way lateral and then removing the specimen. I could feel a tiny nodule within the specimen on the medial half of it, but I felt to be completely around it all directions.  The specimen was inked for pathologic confirmation and a  specimen mammogram done confirming the lesion in the specimen.  I put a 0.25% plain Marcaine in.  I pulled clips into mark the margins. I closed in layers with 3-0 Vicryl, 4-0 Monocryl subcuticular and Dermabond.  The patient tolerated the procedure well and there were no complications.  All counts were correct.     Currie Paris, M.D.     CJS/MEDQ  D:  03/20/2011  T:  03/20/2011  Job:  [id number]  cc:   Ramon Dredge L. Juanetta Gosling, M.D.  Electronically Signed by Cyndia Bent M.D. on 03/21/2011 07:21:32 AM

## 2011-03-22 ENCOUNTER — Telehealth (INDEPENDENT_AMBULATORY_CARE_PROVIDER_SITE_OTHER): Payer: Self-pay | Admitting: General Surgery

## 2011-03-22 NOTE — Telephone Encounter (Signed)
Patient aware path report is benign. She will follow up with [place] on 04/05/11 and call with any problems prior.

## 2011-03-22 NOTE — Telephone Encounter (Signed)
Message copied by Liliana Cline on Fri Mar 22, 2011  9:06 AM ------      Message from: Currie Paris      Created: Thu Mar 21, 2011  7:06 PM       Tell her path is benign, no cancer

## 2011-04-05 ENCOUNTER — Encounter (INDEPENDENT_AMBULATORY_CARE_PROVIDER_SITE_OTHER): Payer: Self-pay | Admitting: Surgery

## 2011-04-05 ENCOUNTER — Ambulatory Visit (INDEPENDENT_AMBULATORY_CARE_PROVIDER_SITE_OTHER): Payer: Medicare Other | Admitting: Surgery

## 2011-04-05 VITALS — BP 145/80 | HR 70

## 2011-04-05 DIAGNOSIS — Z9889 Other specified postprocedural states: Secondary | ICD-10-CM

## 2011-04-05 NOTE — Patient Instructions (Signed)
Your pathology report is benign no cancer. I will see you again if any problems or concerns

## 2011-04-05 NOTE — Progress Notes (Signed)
NAME: ROCIO ROAM Gomez                                            DOB: 27-Jun-1946 DATE: 04/05/2011                                                  MRN: [id number]  CC: Post op  HPI: This patient comes in for post op follow-up.Sheunderwent Left breast biopsy on 03/20/2011. She feels that she is doing well.  PE: General: The patient appears to be healthy, NAD Breast; The wound is healing OK. There is some post-op swelling but no infection.  DATA REVIEWED: Path report showed benign lymph node with mild hyperplasia  IMPRESSION: The patient is doing well S/P Removal benign breast mass.    PLAN: RTC PRN

## 2011-08-06 DIAGNOSIS — M2550 Pain in unspecified joint: Secondary | ICD-10-CM | POA: Diagnosis not present

## 2011-08-06 DIAGNOSIS — M545 Low back pain: Secondary | ICD-10-CM | POA: Diagnosis not present

## 2011-08-06 DIAGNOSIS — E782 Mixed hyperlipidemia: Secondary | ICD-10-CM | POA: Diagnosis not present

## 2011-09-10 ENCOUNTER — Other Ambulatory Visit (HOSPITAL_COMMUNITY): Payer: Self-pay | Admitting: Pulmonary Disease

## 2011-09-10 DIAGNOSIS — Z139 Encounter for screening, unspecified: Secondary | ICD-10-CM

## 2011-09-26 ENCOUNTER — Ambulatory Visit (HOSPITAL_COMMUNITY)
Admission: RE | Admit: 2011-09-26 | Discharge: 2011-09-26 | Disposition: A | Discharge status: Home / Self Care | Payer: Medicare Other | Source: Ambulatory Visit | Attending: Pulmonary Disease | Admitting: Pulmonary Disease

## 2011-09-26 DIAGNOSIS — N63 Unspecified lump in unspecified breast: Secondary | ICD-10-CM | POA: Insufficient documentation

## 2011-09-26 DIAGNOSIS — Z1231 Encounter for screening mammogram for malignant neoplasm of breast: Secondary | ICD-10-CM | POA: Insufficient documentation

## 2011-09-26 DIAGNOSIS — Z139 Encounter for screening, unspecified: Secondary | ICD-10-CM

## 2011-11-05 ENCOUNTER — Other Ambulatory Visit (HOSPITAL_COMMUNITY): Payer: Self-pay | Admitting: Pulmonary Disease

## 2011-11-05 DIAGNOSIS — E785 Hyperlipidemia, unspecified: Secondary | ICD-10-CM | POA: Diagnosis not present

## 2011-11-05 DIAGNOSIS — M5410 Radiculopathy, site unspecified: Secondary | ICD-10-CM

## 2011-11-05 DIAGNOSIS — I10 Essential (primary) hypertension: Secondary | ICD-10-CM | POA: Diagnosis not present

## 2011-11-05 DIAGNOSIS — M25559 Pain in unspecified hip: Secondary | ICD-10-CM | POA: Diagnosis not present

## 2011-11-07 ENCOUNTER — Ambulatory Visit (HOSPITAL_COMMUNITY)
Admission: RE | Admit: 2011-11-07 | Discharge: 2011-11-07 | Disposition: A | Discharge status: Home / Self Care | Payer: Medicare Other | Source: Ambulatory Visit | Attending: Pulmonary Disease | Admitting: Pulmonary Disease

## 2011-11-07 DIAGNOSIS — M79609 Pain in unspecified limb: Secondary | ICD-10-CM | POA: Diagnosis not present

## 2011-11-07 DIAGNOSIS — M47817 Spondylosis without myelopathy or radiculopathy, lumbosacral region: Secondary | ICD-10-CM | POA: Insufficient documentation

## 2011-11-07 DIAGNOSIS — M545 Low back pain, unspecified: Secondary | ICD-10-CM | POA: Diagnosis not present

## 2011-11-07 DIAGNOSIS — M51379 Other intervertebral disc degeneration, lumbosacral region without mention of lumbar back pain or lower extremity pain: Secondary | ICD-10-CM | POA: Insufficient documentation

## 2011-11-07 DIAGNOSIS — M5410 Radiculopathy, site unspecified: Secondary | ICD-10-CM

## 2011-11-07 DIAGNOSIS — M5137 Other intervertebral disc degeneration, lumbosacral region: Secondary | ICD-10-CM | POA: Diagnosis not present

## 2011-11-07 DIAGNOSIS — M5126 Other intervertebral disc displacement, lumbar region: Secondary | ICD-10-CM | POA: Diagnosis not present

## 2012-02-04 DIAGNOSIS — M810 Age-related osteoporosis without current pathological fracture: Secondary | ICD-10-CM | POA: Diagnosis not present

## 2012-02-04 DIAGNOSIS — I10 Essential (primary) hypertension: Secondary | ICD-10-CM | POA: Diagnosis not present

## 2012-02-04 DIAGNOSIS — F329 Major depressive disorder, single episode, unspecified: Secondary | ICD-10-CM | POA: Diagnosis not present

## 2012-02-04 DIAGNOSIS — F3289 Other specified depressive episodes: Secondary | ICD-10-CM | POA: Diagnosis not present

## 2012-02-04 DIAGNOSIS — E109 Type 1 diabetes mellitus without complications: Secondary | ICD-10-CM | POA: Diagnosis not present

## 2012-02-04 DIAGNOSIS — R3129 Other microscopic hematuria: Secondary | ICD-10-CM | POA: Diagnosis not present

## 2012-02-04 DIAGNOSIS — E785 Hyperlipidemia, unspecified: Secondary | ICD-10-CM | POA: Diagnosis not present

## 2012-02-07 DIAGNOSIS — N390 Urinary tract infection, site not specified: Secondary | ICD-10-CM | POA: Diagnosis not present

## 2012-02-07 DIAGNOSIS — R319 Hematuria, unspecified: Secondary | ICD-10-CM | POA: Diagnosis not present

## 2012-02-07 DIAGNOSIS — N83339 Acquired atrophy of ovary and fallopian tube, unspecified side: Secondary | ICD-10-CM | POA: Diagnosis not present

## 2012-02-07 DIAGNOSIS — N950 Postmenopausal bleeding: Secondary | ICD-10-CM | POA: Diagnosis not present

## 2012-02-07 DIAGNOSIS — D251 Intramural leiomyoma of uterus: Secondary | ICD-10-CM | POA: Diagnosis not present

## 2012-02-13 DIAGNOSIS — N9489 Other specified conditions associated with female genital organs and menstrual cycle: Secondary | ICD-10-CM | POA: Diagnosis not present

## 2012-02-13 DIAGNOSIS — N950 Postmenopausal bleeding: Secondary | ICD-10-CM | POA: Diagnosis not present

## 2012-04-02 DIAGNOSIS — Z130 Encounter for screening for diseases of the blood and blood-forming organs and certain disorders involving the immune mechanism: Secondary | ICD-10-CM | POA: Diagnosis not present

## 2012-04-02 DIAGNOSIS — R319 Hematuria, unspecified: Secondary | ICD-10-CM | POA: Diagnosis not present

## 2012-04-02 DIAGNOSIS — N390 Urinary tract infection, site not specified: Secondary | ICD-10-CM | POA: Diagnosis not present

## 2012-04-02 DIAGNOSIS — N840 Polyp of corpus uteri: Secondary | ICD-10-CM | POA: Diagnosis not present

## 2012-04-02 DIAGNOSIS — R809 Proteinuria, unspecified: Secondary | ICD-10-CM | POA: Diagnosis not present

## 2012-04-07 DIAGNOSIS — R809 Proteinuria, unspecified: Secondary | ICD-10-CM | POA: Diagnosis not present

## 2012-04-07 DIAGNOSIS — R319 Hematuria, unspecified: Secondary | ICD-10-CM | POA: Diagnosis not present

## 2012-04-09 DIAGNOSIS — N950 Postmenopausal bleeding: Secondary | ICD-10-CM | POA: Diagnosis not present

## 2012-04-09 DIAGNOSIS — N840 Polyp of corpus uteri: Secondary | ICD-10-CM | POA: Diagnosis not present

## 2012-04-10 DIAGNOSIS — N840 Polyp of corpus uteri: Secondary | ICD-10-CM | POA: Diagnosis not present

## 2012-04-10 DIAGNOSIS — Z01818 Encounter for other preprocedural examination: Secondary | ICD-10-CM | POA: Diagnosis not present

## 2012-04-10 NOTE — Patient Instructions (Signed)
[name]  04/10/2012   Your procedure is scheduled on: 04/17/2012  Report to [facility] at 0800 AM.  Call this number if you have problems the morning of surgery: [phone]   Remember:   Do not eat food:After Midnight.  May have clear liquids:until Midnight .  Clear liquids include soda, tea, black coffee, apple or grape juice, broth.  Take these medicines the morning of surgery with A SIP OF WATER: xanax, norvasc, altace   Do not wear jewelry, make-up or nail polish.  Do not wear lotions, powders, or perfumes. You may wear deodorant.  Do not shave 48 hours prior to surgery. Men may shave face and neck.  Do not bring valuables to the hospital.  Contacts, dentures or bridgework may not be worn into surgery.  Leave suitcase in the car. After surgery it may be brought to your room.  For patients admitted to the hospital, checkout time is 11:00 AM the day of discharge.   Patients discharged the day of surgery will not be allowed to drive home.  Name and phone number of your driver: family  Special Instructions: CHG Shower Use Special Wash: 1/2 bottle night before surgery and 1/2 bottle morning of surgery.   Please read over the following fact sheets that you were given: Pain Booklet, Surgical Site Infection Prevention, Anesthesia Post-op Instructions and Care and Recovery After Surgery   PATIENT INSTRUCTIONS POST-ANESTHESIA  IMMEDIATELY FOLLOWING SURGERY:  Do not drive or operate machinery for the first twenty four hours after surgery.  Do not make any important decisions for twenty four hours after surgery or while taking narcotic pain medications or sedatives.  If you develop intractable nausea and vomiting or a severe headache please notify your doctor immediately.  FOLLOW-UP:  Please make an appointment with your surgeon as instructed. You do not need to follow up with anesthesia unless specifically instructed to do so.  WOUND CARE INSTRUCTIONS (if applicable):  Keep a dry  clean dressing on the anesthesia/puncture wound site if there is drainage.  Once the wound has quit draining you may leave it open to air.  Generally you should leave the bandage intact for twenty four hours unless there is drainage.  If the epidural site drains for more than 36-48 hours please call the anesthesia department.  QUESTIONS?:  Please feel free to call your physician or the hospital operator if you have any questions, and they will be happy to assist you.      Dilation and Curettage or Vacuum Curettage Dilation and curettage (D&C) and vacuum curettage are minor procedures. A D&C involves stretching (dilation) the cervix and scraping (curettage) the inside lining of the womb (uterus). During a D&C, tissue is gently scraped from the inside lining of the uterus. During a vacuum curettage, the lining and tissue in the uterus are removed with the use of gentle suction. Curettage may be performed for diagnostic or therapeutic purposes. As a diagnostic procedure, curettage is performed for the purpose of examining tissues from the uterus. Tissue examination may help determine causes or treatment options for symptoms. A diagnostic curettage may be performed for the following symptoms:  Irregular bleeding in the uterus.   Bleeding with the development of clots.   Spotting between menstrual periods.   Prolonged menstrual periods.   Bleeding after menopause.   No menstrual period (amenorrhea).   A change in size and shape of the uterus.  A therapeutic curettage is performed to remove tissue, blood, or a contraceptive  device. Therapeutic curettage may be performed for the following conditions:   Removal of an IUD (intrauterine device).   Removal of retained placenta after giving birth. Retained placenta can cause bleeding severe enough to require transfusions or an infection.   Abortion.   Miscarriage.   Removal of polyps inside the uterus.   Removal of uncommon types of fibroids  (noncancerous lumps).  LET YOUR CAREGIVER KNOW ABOUT:   Allergies to food or medicine.   Medicines taken, including vitamins, herbs, eyedrops, over-the-counter medicines, and creams.   Use of steroids (by mouth or creams).   Previous problems with anesthetics or numbing medicines.   History of bleeding problems or blood clots.   Previous surgery.   Other health problems, including diabetes and kidney problems.   Possibility of pregnancy, if this applies.  RISKS AND COMPLICATIONS   Excessive bleeding.   Infection of the uterus.   Damage to the cervix.   Development of scar tissue (adhesions) inside the uterus, later causing abnormal amounts of menstrual bleeding.   Complications from the general anesthetic, if a general anesthetic is used.   Putting a hole (perforation) in the uterus. This is rare.  BEFORE THE PROCEDURE   Eat and drink before the procedure only as directed by your caregiver.   Arrange for someone to take you home.  PROCEDURE   This procedure may be done in a hospital, outpatient clinic, or caregiver's office.   You may be given a general anesthetic or a local anesthetic in and around the cervix.   You will lie on your back with your legs in stirrups.   There are two ways in which your cervix can be softened and dilated. These include:   Taking a medicine.   Having thin rods (laminaria) inserted into your cervix.   A curved tool (curette) will scrape cells from the inside lining of the uterus and will then be removed.  This procedure usually takes about 15 to 30 minutes. AFTER THE PROCEDURE   You will rest in the recovery area until you are stable and are ready to go home.   You will need to have someone take you home.   You may feel sick to your stomach (nauseous) or throw up (vomit) if you had general anesthesia.   You may have a sore throat if a tube was placed in your throat during general anesthesia.   You may have light cramping and  bleeding for 2 days to 2 weeks after the procedure.   Your uterus needs to make a new lining after the procedure. This may make your next period late.  Document Released: 07/15/2005 Document Revised: 07/04/2011 Document Reviewed: 02/10/2009 [facility] Patient Information 2012 [place], [place].

## 2012-04-13 ENCOUNTER — Other Ambulatory Visit: Payer: Self-pay

## 2012-04-13 ENCOUNTER — Other Ambulatory Visit: Payer: Self-pay | Admitting: Obstetrics and Gynecology

## 2012-04-13 ENCOUNTER — Encounter (HOSPITAL_COMMUNITY): Payer: Self-pay | Admitting: Pharmacy Technician

## 2012-04-13 ENCOUNTER — Encounter (HOSPITAL_COMMUNITY)
Admission: RE | Admit: 2012-04-13 | Discharge: 2012-04-13 | Disposition: A | Discharge status: Home / Self Care | Payer: Medicare Other | Source: Ambulatory Visit | Attending: Obstetrics and Gynecology | Admitting: Obstetrics and Gynecology

## 2012-04-13 ENCOUNTER — Encounter (HOSPITAL_COMMUNITY): Payer: Self-pay

## 2012-04-13 DIAGNOSIS — I10 Essential (primary) hypertension: Secondary | ICD-10-CM | POA: Diagnosis not present

## 2012-04-13 DIAGNOSIS — N840 Polyp of corpus uteri: Secondary | ICD-10-CM | POA: Diagnosis not present

## 2012-04-13 DIAGNOSIS — Z01812 Encounter for preprocedural laboratory examination: Secondary | ICD-10-CM | POA: Diagnosis not present

## 2012-04-13 DIAGNOSIS — E119 Type 2 diabetes mellitus without complications: Secondary | ICD-10-CM | POA: Diagnosis not present

## 2012-04-13 HISTORY — DX: Hyperlipidemia, unspecified: E78.5

## 2012-04-13 LAB — URINE MICROSCOPIC-ADD ON

## 2012-04-13 LAB — CBC
HCT: 36.7 % (ref 36.0–46.0)
Hemoglobin: 12.2 g/dL (ref 12.0–15.0)
MCH: 29.6 pg (ref 26.0–34.0)
MCHC: 33.2 g/dL (ref 30.0–36.0)
RDW: 13.3 % (ref 11.5–15.5)

## 2012-04-13 LAB — SURGICAL PCR SCREEN: MRSA, PCR: NEGATIVE

## 2012-04-13 LAB — COMPREHENSIVE METABOLIC PANEL
BUN: 18 mg/dL (ref 6–23)
Calcium: 10.4 mg/dL (ref 8.4–10.5)
GFR calc Af Amer: >90 mL/min (ref >90)
Glucose, Bld: 158 mg/dL — ABNORMAL HIGH (ref 70–99)
Sodium: 141 mEq/L (ref 135–145)
Total Protein: 7.4 g/dL (ref 6.0–8.3)

## 2012-04-13 LAB — URINALYSIS, ROUTINE W REFLEX MICROSCOPIC
Nitrite: POSITIVE — AB
Specific Gravity, Urine: 1.02 (ref 1.005–1.030)
pH: 7 (ref 5.0–8.0)

## 2012-04-17 ENCOUNTER — Encounter (HOSPITAL_COMMUNITY): Payer: Self-pay | Admitting: *Deleted

## 2012-04-17 ENCOUNTER — Ambulatory Visit (HOSPITAL_COMMUNITY)
Admission: RE | Admit: 2012-04-17 | Discharge: 2012-04-18 | Disposition: A | Discharge status: Home / Self Care | Payer: Medicare Other | Source: Ambulatory Visit | Attending: Obstetrics and Gynecology | Admitting: Obstetrics and Gynecology

## 2012-04-17 ENCOUNTER — Encounter (HOSPITAL_COMMUNITY): Payer: Self-pay | Admitting: Anesthesiology

## 2012-04-17 ENCOUNTER — Ambulatory Visit (HOSPITAL_COMMUNITY): Payer: Medicare Other | Admitting: Anesthesiology

## 2012-04-17 ENCOUNTER — Encounter (HOSPITAL_COMMUNITY)
Admission: RE | Disposition: A | Discharge status: Home / Self Care | Payer: Self-pay | Source: Ambulatory Visit | Attending: Obstetrics and Gynecology

## 2012-04-17 DIAGNOSIS — N950 Postmenopausal bleeding: Secondary | ICD-10-CM | POA: Diagnosis not present

## 2012-04-17 DIAGNOSIS — I10 Essential (primary) hypertension: Secondary | ICD-10-CM | POA: Insufficient documentation

## 2012-04-17 DIAGNOSIS — N840 Polyp of corpus uteri: Secondary | ICD-10-CM | POA: Insufficient documentation

## 2012-04-17 DIAGNOSIS — D250 Submucous leiomyoma of uterus: Secondary | ICD-10-CM | POA: Diagnosis not present

## 2012-04-17 DIAGNOSIS — D260 Other benign neoplasm of cervix uteri: Secondary | ICD-10-CM | POA: Diagnosis not present

## 2012-04-17 DIAGNOSIS — E119 Type 2 diabetes mellitus without complications: Secondary | ICD-10-CM | POA: Insufficient documentation

## 2012-04-17 DIAGNOSIS — Z01812 Encounter for preprocedural laboratory examination: Secondary | ICD-10-CM | POA: Diagnosis not present

## 2012-04-17 HISTORY — PX: HYSTEROSCOPY WITH D & C: SHX1775

## 2012-04-17 LAB — GLUCOSE, CAPILLARY: Glucose-Capillary: 123 mg/dL — ABNORMAL HIGH (ref 70–99)

## 2012-04-17 SURGERY — DILATATION AND CURETTAGE /HYSTEROSCOPY
Anesthesia: General | Wound class: Clean Contaminated

## 2012-04-17 MED ORDER — BUPIVACAINE-EPINEPHRINE 0.5% -1:200000 IJ SOLN
INTRAMUSCULAR | Status: DC | PRN
  Administered 2012-04-17: 10 mL

## 2012-04-17 MED ORDER — ONDANSETRON HCL 4 MG/2ML IJ SOLN
4.0000 mg | Freq: Once | INTRAMUSCULAR | Status: AC
  Administered 2012-04-17: 4 mg via INTRAVENOUS

## 2012-04-17 MED ORDER — 0.9 % SODIUM CHLORIDE (POUR BTL) OPTIME
TOPICAL | Status: DC | PRN
  Administered 2012-04-17: 500 mL

## 2012-04-17 MED ORDER — LIDOCAINE HCL (PF) 1 % IJ SOLN
INTRAMUSCULAR | Status: AC
  Filled 2012-04-17: qty 5

## 2012-04-17 MED ORDER — FENTANYL CITRATE 0.05 MG/ML IJ SOLN
INTRAMUSCULAR | Status: DC | PRN
  Administered 2012-04-17 (×2): 25 ug via INTRAVENOUS
  Administered 2012-04-17: 50 ug via INTRAVENOUS

## 2012-04-17 MED ORDER — ONDANSETRON HCL 4 MG/2ML IJ SOLN
4.0000 mg | Freq: Once | INTRAMUSCULAR | Status: AC | PRN
  Administered 2012-04-17: 4 mg via INTRAVENOUS

## 2012-04-17 MED ORDER — HYDROCODONE-ACETAMINOPHEN 5-500 MG PO TABS: 1.0000 | ORAL_TABLET | Freq: Four times a day (QID) | ORAL | Status: DC | PRN

## 2012-04-17 MED ORDER — CIPROFLOXACIN IN D5W 400 MG/200ML IV SOLN
400.0000 mg | INTRAVENOUS | Status: AC
  Administered 2012-04-17: 400 mg via INTRAVENOUS

## 2012-04-17 MED ORDER — SODIUM CHLORIDE 0.9 % IR SOLN
Status: DC | PRN
  Administered 2012-04-17: 3000 mL

## 2012-04-17 MED ORDER — CIPROFLOXACIN IN D5W 400 MG/200ML IV SOLN
INTRAVENOUS | Status: AC
  Filled 2012-04-17: qty 200

## 2012-04-17 MED ORDER — BUPIVACAINE-EPINEPHRINE PF 0.5-1:200000 % IJ SOLN
INTRAMUSCULAR | Status: AC
  Filled 2012-04-17: qty 10

## 2012-04-17 MED ORDER — METRONIDAZOLE IN NACL 5-0.79 MG/ML-% IV SOLN
500.0000 mg | INTRAVENOUS | Status: AC
  Administered 2012-04-17: 1 g via INTRAVENOUS

## 2012-04-17 MED ORDER — GLYCOPYRROLATE 0.2 MG/ML IJ SOLN
INTRAMUSCULAR | Status: AC
  Filled 2012-04-17: qty 1

## 2012-04-17 MED ORDER — PROPOFOL 10 MG/ML IV BOLUS
INTRAVENOUS | Status: DC | PRN
  Administered 2012-04-17: 120 mg via INTRAVENOUS

## 2012-04-17 MED ORDER — FENTANYL CITRATE 0.05 MG/ML IJ SOLN
INTRAMUSCULAR | Status: AC
  Filled 2012-04-17: qty 2

## 2012-04-17 MED ORDER — MIDAZOLAM HCL 2 MG/2ML IJ SOLN
INTRAMUSCULAR | Status: AC
  Filled 2012-04-17: qty 2

## 2012-04-17 MED ORDER — GLYCOPYRROLATE 0.2 MG/ML IJ SOLN
0.2000 mg | Freq: Once | INTRAMUSCULAR | Status: AC
Start: 2012-04-17 — End: 2012-04-17
  Administered 2012-04-17: 0.2 mg via INTRAVENOUS

## 2012-04-17 MED ORDER — MIDAZOLAM HCL 2 MG/2ML IJ SOLN
1.0000 mg | INTRAMUSCULAR | Status: DC | PRN
  Administered 2012-04-17: 2 mg via INTRAVENOUS

## 2012-04-17 MED ORDER — IBUPROFEN 600 MG PO TABS: 600.0000 mg | ORAL_TABLET | Freq: Four times a day (QID) | ORAL | Status: DC | PRN

## 2012-04-17 MED ORDER — LIDOCAINE HCL (CARDIAC) 10 MG/ML IV SOLN
INTRAVENOUS | Status: DC | PRN
  Administered 2012-04-17: 10 mg via INTRAVENOUS

## 2012-04-17 MED ORDER — DOXYCYCLINE HYCLATE 100 MG PO CAPS: 100.0000 mg | ORAL_CAPSULE | Freq: Two times a day (BID) | ORAL | Status: DC

## 2012-04-17 MED ORDER — METRONIDAZOLE IN NACL 5-0.79 MG/ML-% IV SOLN
INTRAVENOUS | Status: AC
  Filled 2012-04-17: qty 100

## 2012-04-17 MED ORDER — LACTATED RINGERS IV SOLN
INTRAVENOUS | Status: DC
  Administered 2012-04-17: 09:00:00 via INTRAVENOUS

## 2012-04-17 MED ORDER — PROPOFOL 10 MG/ML IV EMUL
INTRAVENOUS | Status: AC
  Filled 2012-04-17: qty 20

## 2012-04-17 MED ORDER — FENTANYL CITRATE 0.05 MG/ML IJ SOLN
INTRAMUSCULAR | Status: AC
  Administered 2012-04-17: 25 ug via INTRAVENOUS
  Filled 2012-04-17: qty 2

## 2012-04-17 MED ORDER — ONDANSETRON HCL 4 MG/2ML IJ SOLN
INTRAMUSCULAR | Status: AC
  Filled 2012-04-17: qty 2

## 2012-04-17 MED ORDER — FENTANYL CITRATE 0.05 MG/ML IJ SOLN
25.0000 ug | INTRAMUSCULAR | Status: DC | PRN
  Administered 2012-04-17: 50 ug via INTRAVENOUS
  Administered 2012-04-17 (×2): 25 ug via INTRAVENOUS

## 2012-04-17 SURGICAL SUPPLY — 35 items
BAG HAMPER (MISCELLANEOUS) ×2 IMPLANT
CLOTH BEACON ORANGE TIMEOUT ST (SAFETY) ×2 IMPLANT
COVER LIGHT HANDLE STERIS (MISCELLANEOUS) ×4 IMPLANT
DECANTER SPIKE VIAL GLASS SM (MISCELLANEOUS) ×2 IMPLANT
DRAPE PROXIMA HALF (DRAPES) ×1 IMPLANT
DRAPE STERI URO 9X17 APER PCH (DRAPES) ×1 IMPLANT
ELECT REM PT RETURN 9FT ADLT (ELECTROSURGICAL) ×2
ELECTRODE REM PT RTRN 9FT ADLT (ELECTROSURGICAL) ×1 IMPLANT
FORMALIN 10 PREFIL 120ML (MISCELLANEOUS) ×2 IMPLANT
FORMALIN 10 PREFIL 480ML (MISCELLANEOUS) ×1 IMPLANT
GAUZE PACKING 2X5 YD STERILE (GAUZE/BANDAGES/DRESSINGS) ×1 IMPLANT
GLOVE BIOGEL PI IND STRL 7.5 (GLOVE) IMPLANT
GLOVE BIOGEL PI INDICATOR 7.5 (GLOVE) ×1
GLOVE ECLIPSE 7.0 STRL STRAW (GLOVE) ×1 IMPLANT
GLOVE ECLIPSE 9.0 STRL (GLOVE) ×2 IMPLANT
GLOVE EXAM NITRILE MD LF STRL (GLOVE) ×1 IMPLANT
GLOVE INDICATOR STER SZ 9 (GLOVE) ×2 IMPLANT
GOWN STRL REIN 3XL LVL4 (GOWN DISPOSABLE) ×2 IMPLANT
GOWN STRL REIN XL XLG (GOWN DISPOSABLE) ×2 IMPLANT
INST SET HYSTEROSCOPY (KITS) ×2 IMPLANT
IV NS IRRIG 3000ML ARTHROMATIC (IV SOLUTION) ×2 IMPLANT
KIT ROOM TURNOVER APOR (KITS) ×2 IMPLANT
MANIFOLD NEPTUNE II (INSTRUMENTS) ×2 IMPLANT
NS IRRIG 1000ML POUR BTL (IV SOLUTION) ×2 IMPLANT
PACK PERI GYN (CUSTOM PROCEDURE TRAY) ×2 IMPLANT
PAD ARMBOARD 7.5X6 YLW CONV (MISCELLANEOUS) ×2 IMPLANT
PAD TELFA 3X4 1S STER (GAUZE/BANDAGES/DRESSINGS) ×2 IMPLANT
SET BASIN LINEN APH (SET/KITS/TRAYS/PACK) ×2 IMPLANT
SET CYSTO W/LG BORE CLAMP LF (SET/KITS/TRAYS/PACK) ×2 IMPLANT
SET IV ADMIN VERSALIGHT (MISCELLANEOUS) IMPLANT
SUT CHROMIC 2 0 CT 1 (SUTURE) IMPLANT
SUT PROLENE 2 0 FS (SUTURE) IMPLANT
SUT VIC AB 0 CT2 8-18 (SUTURE) IMPLANT
SYR CONTROL 10ML LL (SYRINGE) ×1 IMPLANT
VERSALIGHT (MISCELLANEOUS) IMPLANT

## 2012-04-17 NOTE — Anesthesia Postprocedure Evaluation (Signed)
Anesthesia Post Note  Patient: April Gomez  Procedure(s) Performed: Procedure(s) (LRB): DILATATION AND CURETTAGE /HYSTEROSCOPY (N/A) POLYPECTOMY (N/A)  Anesthesia type: General  Patient location: PACU  Post pain: Pain level controlled  Post assessment: Post-op Vital signs reviewed, Patient's Cardiovascular Status Stable, Respiratory Function Stable, Patent Airway, No signs of Nausea or vomiting and Pain level controlled  Last Vitals:  Filed Vitals:   04/17/12 1036  BP: 122/58  Pulse: 75  Temp: 36.5 C  Resp: 14    Post vital signs: Reviewed and stable  Level of consciousness: awake and alert   Complications: No apparent anesthesia complications

## 2012-04-17 NOTE — Addendum Note (Signed)
Addendum  created 04/17/12 1104 by Franco Nones, CRNA   Modules edited:Charges VN

## 2012-04-17 NOTE — Anesthesia Procedure Notes (Signed)
Procedure Name: LMA Insertion Date/Time: 04/17/2012 9:33 AM Performed by: Franco Nones Pre-anesthesia Checklist: Patient identified, Patient being monitored, Emergency Drugs available, Timeout performed and Suction available Patient Re-evaluated:Patient Re-evaluated prior to inductionOxygen Delivery Method: Circle System Utilized Preoxygenation: Pre-oxygenation with 100% oxygen Intubation Type: IV induction Ventilation: Mask ventilation without difficulty LMA: LMA inserted LMA Size: 4.0 Number of attempts: 1 Placement Confirmation: positive ETCO2 and breath sounds checked- equal and bilateral

## 2012-04-17 NOTE — Anesthesia Preprocedure Evaluation (Signed)
Anesthesia Evaluation  Patient identified by MRN, date of birth, ID band Patient awake    Reviewed: Allergy & Precautions, H&P , NPO status , Patient's Chart, lab work & pertinent test results  History of Anesthesia Complications Negative for: history of anesthetic complications  Airway Mallampati: I TM Distance: >3 FB     Dental  (+) Partial Lower and Implants   Pulmonary former smoker (sinus congestion),  breath sounds clear to auscultation        Cardiovascular hypertension, Pt. on medications Rhythm:Regular Rate:Normal     Neuro/Psych PSYCHIATRIC DISORDERS Anxiety    GI/Hepatic   Endo/Other  diabetes, Well Controlled, Type 2, Oral Hypoglycemic Agents  Renal/GU      Musculoskeletal  (+) Arthritis - (chronic LBP),   Abdominal   Peds  Hematology   Anesthesia Other Findings   Reproductive/Obstetrics                           Anesthesia Physical Anesthesia Plan  ASA: II  Anesthesia Plan: General   Post-op Pain Management:    Induction: Intravenous  Airway Management Planned: LMA  Additional Equipment:   Intra-op Plan:   Post-operative Plan: Extubation in OR  Informed Consent: I have reviewed the patients History and Physical, chart, labs and discussed the procedure including the risks, benefits and alternatives for the proposed anesthesia with the patient or authorized representative who has indicated his/her understanding and acceptance.     Plan Discussed with:   Anesthesia Plan Comments:         Anesthesia Quick Evaluation

## 2012-04-17 NOTE — Transfer of Care (Signed)
Immediate Anesthesia Transfer of Care Note  Patient: April Gomez  Procedure(s) Performed: Procedure(s) (LRB): DILATATION AND CURETTAGE /HYSTEROSCOPY (N/A) POLYPECTOMY (N/A)  Patient Location: PACU  Anesthesia Type: General  Level of Consciousness: awake  Airway & Oxygen Therapy: Patient Spontanous Breathing and non-rebreather face mask  Post-op Assessment: Report given to PACU RN, Post -op Vital signs reviewed and stable and Patient moving all extremities  Post vital signs: Reviewed and stable  Complications: No apparent anesthesia complications

## 2012-04-17 NOTE — Brief Op Note (Signed)
04/17/2012  10:47 AM  PATIENT:  Florencia Reasons  66 y.o. female  PRE-OPERATIVE DIAGNOSIS:  endometrial polyp  POST-OPERATIVE DIAGNOSIS:  endometrial polyp  PROCEDURE:  Procedure(s) (LRB) with comments: DILATATION AND CURETTAGE /HYSTEROSCOPY (N/A) POLYPECTOMY (N/A) - excision submucosal myoma  SURGEON:  Surgeon(s) and Role:    * Tilda Burrow, MD - Primary  PHYSICIAN ASSISTANT:   ASSISTANTS: none  Blowers CST ANESTHESIA:   local and paracervical block,  general  EBL:  Total I/O In: 500 [I.V.:500] Out: 0   BLOOD ADMINISTERED:none DRAINS: none  LOCAL MEDICATIONS USED:  MARCAINE   20 cc  SPECIMEN:  Source of Specimen:  Submucous fibroid endometrial curettings DISPOSITION OF SPECIMEN:  PATHOLOGY  COUNTS:  YES TOURNIQUET:  * No tourniquets in log * DICTATION: .Dragon Dictation Patient was taken to the operating room, prepped and draped for vaginal procedure, the timeout was conducted and antibiotics administered.. A speculum was inserted in the vagina and cervix grasped with single-tooth tenaculum. The uterus was sounded in the anteflexed position to 7.5 cm. The cervix was dilated to 23 [place] allowing introduction of the rigid 30 hysteroscope which showed a smooth endometrial cavity except for an anterior submucous fibroid protruding 50% of its diameter into the endometrial cavity. The operative scissors were used to shave the fibroid off and attempting to level it with the atrophic endometrial surface. As the specimen became bivalved, Randall stone forceps could be used to grasp the specimen and extracted in fragments. The fibroid was removed including some of the fibroid that extended into the myometrium. At no time was there any suspicion of uterine perforation. The edges of the area of surgery have some irregular edges which were trimmed using the hysteroscopic scissors. The tissue fragments were collected and sent as part of the specimen, and photodocumentation showed a more  normal-appearing endometrial cavity the completion of the procedure, with tubal ostia still visible as they been at the start of the procedure. Hemostasis was good. The patient went to recovery room in stable condition and will be discharged today  PLAN OF CARE: Discharge to home after PACU  PATIENT DISPOSITION:  PACU - hemodynamically stable.   Delay start of Pharmacological VTE agent (>24hrs) due to surgical blood loss or risk of bleeding: not applicable

## 2012-04-17 NOTE — H&P (Signed)
April Gomez is an 66 y.o. female. She is being admitted for hysteroscopy D&C and removal of suspected endometrial polyp. This could represent either a submucous myoma or broad-based endometrial polyp. She has had a sonohysterogram which delineate the small mass with an irregular surface protruding into the endometrial cavity. She has had a small amount of postmenopausal bleeding occurring since July. She refused an office endometrial biopsy due to discomfort concerns. Additionally she is admitted at proteinuria during this evaluation has had urinalysis which shows 100 mg/dL of proteinuria. 24-hour urine collection shows 1754 mg of proteinuria per day. She has a followup appointment Dr. Kristian Covey October 29 for evaluation of this issue  Pertinent Gynecological History: Menses: flow is light and post-menopausal Bleeding: post menopausal bleeding Contraception: post menopausal status DES exposure: unknown Blood transfusions: none Sexually transmitted diseases: no past history Previous GYN Procedures: None  Last mammogram: normal Date: ` 09/26/2011 Last pap: normal Date: February 2013 OB History: G3, P3   Menstrual History: Menarche age:  No LMP recorded. Patient is postmenopausal.    Past Medical History  Diagnosis Date  . Low back pain   . Hip pain, right   . Fatigue   . Hypertension   . IBD (inflammatory bowel disease)   . Allergy   . Hyperlipidemia   . Diabetes mellitus   . Arthritis     Past Surgical History  Procedure Date  . Tubal ligation   . Bunionectomy   . Breast biopsy 02/2011    Left - Dr Jamey Ripa    Family History  Problem Relation Age of Onset  . Heart disease Mother   . Heart disease Father   . Cancer Sister     breast   . Diabetes Brother   . Cancer Brother     prostate    Social History:  reports that she has quit smoking. She does not have any smokeless tobacco history on file. She reports that she does not drink alcohol or use illicit  drugs.  Allergies:  Allergies  Allergen Reactions  . Cefuroxime Axetil Rash    No prescriptions prior to admission    ROS  There were no vitals taken for this visit. weight 153.8 blood pressure 116/72 pulse 70s Physical Exam. Physical Examination: General appearance - alert, well appearing, and in no distress, oriented to person, place, and time and normal appearing weight Mental status - alert, oriented to person, place, and time, normal mood, behavior, speech, dress, motor activity, and thought processes Neck - normal thyroid Chest - clear to auscultation, no wheezes, rales or rhonchi, symmetric air entry Heart - normal rate and regular rhythm Abdomen - soft, nontender, nondistended, no masses or organomegaly Pelvic - normal external genitalia, vulva, vagina, cervix, uterus and adnexa, ultrasound shows a small broad-based polyp within the endometrial cavity. Tubes and ovaries are normal Extremities - peripheral pulses normal, no pedal edema, no clubbing or cyanosis   No results found for this or any previous visit (from the past 24 hour(s)).  No results found.  Assessment/Plan: Postmenopausal bleeding due to endometrial polyp, broad-based Proteinuria, under evaluation with referral to Dr. Kristian Covey in place Plan: Hysteroscopy D&C with excision of endometrial polyp 04/17/2012 Oralee Rapaport V 04/17/2012, 7:25 AM

## 2012-04-17 NOTE — Op Note (Signed)
See operative details included in the brief operative note 

## 2012-04-21 ENCOUNTER — Encounter (HOSPITAL_COMMUNITY): Payer: Self-pay | Admitting: Obstetrics and Gynecology

## 2012-05-01 DIAGNOSIS — Z9889 Other specified postprocedural states: Secondary | ICD-10-CM | POA: Diagnosis not present

## 2012-05-05 DIAGNOSIS — R3129 Other microscopic hematuria: Secondary | ICD-10-CM | POA: Diagnosis not present

## 2012-05-05 DIAGNOSIS — E109 Type 1 diabetes mellitus without complications: Secondary | ICD-10-CM | POA: Diagnosis not present

## 2012-05-05 DIAGNOSIS — I10 Essential (primary) hypertension: Secondary | ICD-10-CM | POA: Diagnosis not present

## 2012-05-05 DIAGNOSIS — E785 Hyperlipidemia, unspecified: Secondary | ICD-10-CM | POA: Diagnosis not present

## 2012-05-13 DIAGNOSIS — R809 Proteinuria, unspecified: Secondary | ICD-10-CM | POA: Diagnosis not present

## 2012-05-13 DIAGNOSIS — D414 Neoplasm of uncertain behavior of bladder: Secondary | ICD-10-CM | POA: Diagnosis not present

## 2012-05-13 DIAGNOSIS — N950 Postmenopausal bleeding: Secondary | ICD-10-CM | POA: Diagnosis not present

## 2012-05-13 DIAGNOSIS — R319 Hematuria, unspecified: Secondary | ICD-10-CM | POA: Diagnosis not present

## 2012-05-13 DIAGNOSIS — N189 Chronic kidney disease, unspecified: Secondary | ICD-10-CM | POA: Diagnosis not present

## 2012-05-15 DIAGNOSIS — D500 Iron deficiency anemia secondary to blood loss (chronic): Secondary | ICD-10-CM | POA: Diagnosis not present

## 2012-05-15 DIAGNOSIS — D414 Neoplasm of uncertain behavior of bladder: Secondary | ICD-10-CM | POA: Diagnosis not present

## 2012-05-19 DIAGNOSIS — R310 Gross hematuria: Secondary | ICD-10-CM | POA: Diagnosis not present

## 2012-05-19 DIAGNOSIS — N3000 Acute cystitis without hematuria: Secondary | ICD-10-CM | POA: Diagnosis not present

## 2012-05-20 ENCOUNTER — Other Ambulatory Visit: Payer: Self-pay | Admitting: Urology

## 2012-05-21 MED ORDER — MITOMYCIN CHEMO FOR BLADDER INSTILLATION 40 MG: 40.0000 mg | Freq: Once | INTRAVENOUS | Status: AC

## 2012-05-26 ENCOUNTER — Encounter (HOSPITAL_BASED_OUTPATIENT_CLINIC_OR_DEPARTMENT_OTHER): Payer: Self-pay | Admitting: *Deleted

## 2012-05-26 NOTE — Progress Notes (Signed)
NPO AFTER MN. ARRIVES AT 0800. NEEDS ISTAT. CURRENT EKG IN EPIC AND CHART. WILL TAKE PRAVASTATIN AM OF SURG W/ SIP OF WATER.

## 2012-05-28 NOTE — H&P (Signed)
History of Present Illness      Bladder tumor: The patient underwent a pelvic ultrasound which revealed a 4.4 cm mass within the bladder located anteriorly. She had been found to have microscopic hematuria.  She reports that she was having what she thought was some bleeding from the vagina that she described as pinkness when she wiped after urinating. She was found to have bled on her urinalysis and was placed on empiric Septra. She thought she might be experiencing a urinary tract infection because she was having some urinary frequency. She has had some slight discomfort in the left lower quadrant/groin region. Her hematuria would be considered of mild severity with no modifying factors.   Past Medical History Problems  1. History of  Arthritis V13.4 2. History of  Diabetes Mellitus 250.00 3. History of  Fatigue 780.79 4. History of  Hyperlipidemia 272.4 5. History of  Hypertension 401.9 6. History of  Lower Back Pain 724.2  Surgical History Problems  1. History of  Dilation And Curettage 2. History of  Hysterectomy V45.77 3. History of  Incisional Breast Biopsy 4. History of  Tubal Ligation V25.2  Current Meds 1. Altace 10 MG Oral Capsule; Therapy: (Recorded:22Oct2013) to 2. Asacol HD 800 MG Oral Tablet Delayed Release; Therapy: (Recorded:22Oct2013) to 3. Aspirin 81 MG Oral Tablet; Therapy: (Recorded:22Oct2013) to 4. Calcium 500 TABS; Therapy: (Recorded:22Oct2013) to 5. Claritin 10 MG Oral Tablet; Therapy: (Recorded:22Oct2013) to 6. CoQ-10 100 MG Oral Capsule; Therapy: (Recorded:22Oct2013) to 7. MetFORMIN HCl ER 500 MG Oral Tablet Extended Release 24 Hour; Therapy:  (Recorded:22Oct2013) to 8. Multi-Day TABS; Therapy: (Recorded:22Oct2013) to 9. Norvasc 10 MG Oral Tablet; Therapy: (Recorded:22Oct2013) to 10. Pravastatin Sodium 40 MG Oral Tablet; Therapy: (Recorded:22Oct2013) to 11. Xanax TABS; Therapy: (Recorded:22Oct2013) to  Allergies Medication  1. Cefuroxime Axetil  TABS  Family History Problems  1. Sororal history of  Breast Cancer V16.3 2. Fraternal history of  Diabetes Mellitus V18.0 3. Maternal history of  Heart Disease V17.49 4. Paternal history of  Heart Disease V17.49 5. Fraternal history of  Prostate Cancer V16.42  Social History Problems    Caffeine Use   Former Smoker V15.82   Marital History - Currently Married Denied    History of  Alcohol Use  Review of Systems Genitourinary, constitutional, skin, eye, otolaryngeal, hematologic/lymphatic, cardiovascular, pulmonary, endocrine, musculoskeletal, gastrointestinal, neurological and psychiatric system(s) were reviewed and pertinent findings if present are noted.  Genitourinary: urinary frequency, urinary urgency, nocturia and hematuria.  Gastrointestinal: constipation.  Constitutional: feeling tired (fatigue) and recent weight loss.  ENT: sinus problems.  Musculoskeletal: back pain and joint pain.  Psychiatric: anxiety.    Vitals Vital Signs  BMI Calculated: 25.33 BSA Calculated: 1.76 Height: 5 ft 5 in Weight: 152 lb   Blood Pressure: 147 / 83 Temperature: 97.8 F Heart Rate: 97  Physical Exam Constitutional: Well nourished and well developed . No acute distress.  ENT:. The ears and nose are normal in appearance.  Neck: The appearance of the neck is normal and no neck mass is present.  Pulmonary: No respiratory distress and normal respiratory rhythm and effort.  Cardiovascular: Heart rate and rhythm are normal . No peripheral edema.  Abdomen: The abdomen is soft and nontender. No masses are palpated. No CVA tenderness. No hernias are palpable. No hepatosplenomegaly noted.  Lymphatics: The femoral and inguinal nodes are not enlarged or tender.  Skin: Normal skin turgor, no visible rash and no visible skin lesions.  Neuro/Psych:. Mood and affect are appropriate.  Genitourinary:  Chaperone Present: .  Examination of the external genitalia shows normal female external  genitalia and no lesions. The urethra is normal in appearance and not tender. There is no urethral mass. Vaginal exam demonstrates no abnormalities. The adnexa are palpably normal. The bladder is non tender and not distended. The anus is normal on inspection. The perineum is normal on inspection.    Results/Data Urine  COLOR RED   APPEARANCE CLOUDY   SPECIFIC GRAVITY 1.025   pH 7.0   GLUCOSE NEG mg/dL  BILIRUBIN NEG   KETONE TRACE mg/dL  BLOOD LARGE   PROTEIN > 300 mg/dL  UROBILINOGEN 1 mg/dL  NITRITE POS   LEUKOCYTE ESTERASE SMALL   SQUAMOUS EPITHELIAL/HPF FEW   WBC 0-2 WBC/hpf  RBC TNTC RBC/hpf  BACTERIA NONE SEEN   CRYSTALS NONE SEEN   CASTS NONE SEEN    Old records or history reviewed: notes from Dr. Rayna Sexton office as above.  The following clinical lab reports were reviewed:  Urinalysis as above.    Procedure  Procedure: Cystoscopy  Chaperone Present: .  Indication: Hematuria. Bladder Mass.  Informed Consent: Risks, benefits, and potential adverse events were discussed and informed consent was obtained from the patient.  Prep: The patient was prepped with hibiclens.  Procedure Note:  Urethral meatus:. No abnormalities.  Anterior urethra: No abnormalities.  Bladder: Visulization was clear. The ureteral orifices were in the normal anatomic position bilaterally and had clear efflux of urine. A systematic survey of the bladder demonstrated no bladder tumors or stones. The mucosa was smooth without abnormalities. A solitary tumor was visualized in the bladder. A papillary tumor was seen in the bladder. The patient tolerated the procedure well.  Complications: None.    Assessment Assessed  1. Bladder Mass (4.4  Cm) 2. Working diagnosis of  Transitional Cell Carcinoma Of The Bladder 188.9       The bladder mass seen on ultrasound appears to be a papillary transitional cell carcinoma. It was very difficult to get a good look at the tumor since it was so large and she had  gross hematuria limiting my visualization as well. There is little doubt of the need to evaluate her further under anesthesia and also evaluate her upper tract.  I have discussed with the patient the need for further evaluation by both obtain a pathologic diagnosis by transurethral resection of the tumor and also probable staging with a CT scan of the pelvis once the pathologic diagnosis has been obtained.  I went over the procedure of  TURBT and bilateral retrograde pyelography with the patient in detail. We discussed the anticipated outpatient nature of the procedure, the procedure itself in detail as well as the potential risks, complications and probability of success. I have also discussed the planned insufflation of mitomycin-C postoperatively in order to reduce risk of recurrence and its potential risks as well as the fact that suspected breech of the bladder wall would make the insufflation contraindicated. I have answered all of her questions to her satisfaction and she has elected to proceed.   Plan       1.She will be scheduled for transurethral resection of her bladder tumor and bilateral retrograde pyelograms. 2. I will tentatively plan to instill mitomycin-C postoperatively. 3. She will stop her daily aspirin.

## 2012-05-29 ENCOUNTER — Encounter (HOSPITAL_BASED_OUTPATIENT_CLINIC_OR_DEPARTMENT_OTHER): Payer: Self-pay | Admitting: *Deleted

## 2012-05-29 ENCOUNTER — Encounter (HOSPITAL_BASED_OUTPATIENT_CLINIC_OR_DEPARTMENT_OTHER)
Admission: RE | Disposition: A | Discharge status: Home / Self Care | Payer: Self-pay | Source: Ambulatory Visit | Attending: Urology

## 2012-05-29 ENCOUNTER — Ambulatory Visit (HOSPITAL_BASED_OUTPATIENT_CLINIC_OR_DEPARTMENT_OTHER): Payer: Medicare Other | Admitting: Anesthesiology

## 2012-05-29 ENCOUNTER — Encounter (HOSPITAL_BASED_OUTPATIENT_CLINIC_OR_DEPARTMENT_OTHER): Payer: Self-pay | Admitting: Anesthesiology

## 2012-05-29 ENCOUNTER — Ambulatory Visit (HOSPITAL_BASED_OUTPATIENT_CLINIC_OR_DEPARTMENT_OTHER)
Admission: RE | Admit: 2012-05-29 | Discharge: 2012-05-29 | Disposition: A | Discharge status: Home / Self Care | Payer: Medicare Other | Source: Ambulatory Visit | Attending: Urology | Admitting: Urology

## 2012-05-29 DIAGNOSIS — E119 Type 2 diabetes mellitus without complications: Secondary | ICD-10-CM | POA: Insufficient documentation

## 2012-05-29 DIAGNOSIS — Z7982 Long term (current) use of aspirin: Secondary | ICD-10-CM | POA: Insufficient documentation

## 2012-05-29 DIAGNOSIS — C679 Malignant neoplasm of bladder, unspecified: Secondary | ICD-10-CM | POA: Diagnosis not present

## 2012-05-29 DIAGNOSIS — E785 Hyperlipidemia, unspecified: Secondary | ICD-10-CM | POA: Diagnosis not present

## 2012-05-29 DIAGNOSIS — I10 Essential (primary) hypertension: Secondary | ICD-10-CM | POA: Diagnosis not present

## 2012-05-29 DIAGNOSIS — Z90710 Acquired absence of both cervix and uterus: Secondary | ICD-10-CM | POA: Insufficient documentation

## 2012-05-29 DIAGNOSIS — D494 Neoplasm of unspecified behavior of bladder: Secondary | ICD-10-CM | POA: Diagnosis not present

## 2012-05-29 HISTORY — PX: CYSTOSCOPY/RETROGRADE/URETEROSCOPY: SHX5316

## 2012-05-29 HISTORY — DX: Hematuria, unspecified: R31.9

## 2012-05-29 HISTORY — DX: Reserved for concepts with insufficient information to code with codable children: IMO0002

## 2012-05-29 HISTORY — PX: TRANSURETHRAL RESECTION OF BLADDER TUMOR: SHX2575

## 2012-05-29 HISTORY — DX: Urgency of urination: R39.15

## 2012-05-29 LAB — GLUCOSE, CAPILLARY: Glucose-Capillary: 127 mg/dL — ABNORMAL HIGH (ref 70–99)

## 2012-05-29 LAB — POCT I-STAT 4, (NA,K, GLUC, HGB,HCT)
Glucose, Bld: 138 mg/dL — ABNORMAL HIGH (ref 70–99)
HCT: 37 % (ref 36.0–46.0)
Hemoglobin: 12.6 g/dL (ref 12.0–15.0)
Potassium: 4 mEq/L (ref 3.5–5.1)
Sodium: 142 mEq/L (ref 135–145)

## 2012-05-29 SURGERY — TURBT (TRANSURETHRAL RESECTION OF BLADDER TUMOR)
Anesthesia: General | Site: Ureter | Laterality: Right | Wound class: Clean Contaminated

## 2012-05-29 MED ORDER — SUCCINYLCHOLINE CHLORIDE 20 MG/ML IJ SOLN
INTRAMUSCULAR | Status: DC | PRN
  Administered 2012-05-29: 60 mg via INTRAVENOUS

## 2012-05-29 MED ORDER — ONDANSETRON HCL 4 MG/2ML IJ SOLN
INTRAMUSCULAR | Status: DC | PRN
  Administered 2012-05-29: 4 mg via INTRAVENOUS

## 2012-05-29 MED ORDER — NITROFURANTOIN MONOHYD MACRO 100 MG PO CAPS: 100.0000 mg | ORAL_CAPSULE | ORAL | Status: DC

## 2012-05-29 MED ORDER — PROPOFOL 10 MG/ML IV BOLUS
INTRAVENOUS | Status: DC | PRN
  Administered 2012-05-29: 200 mg via INTRAVENOUS

## 2012-05-29 MED ORDER — PHENAZOPYRIDINE HCL 200 MG PO TABS: 200.0000 mg | ORAL_TABLET | Freq: Three times a day (TID) | ORAL | Status: DC | PRN

## 2012-05-29 MED ORDER — CIPROFLOXACIN IN D5W 200 MG/100ML IV SOLN
200.0000 mg | INTRAVENOUS | Status: AC
  Administered 2012-05-29: 200 mg via INTRAVENOUS
  Filled 2012-05-29: qty 100

## 2012-05-29 MED ORDER — TOLTERODINE TARTRATE ER 4 MG PO CP24: 4.0000 mg | ORAL_CAPSULE | Freq: Every day | ORAL | Status: DC

## 2012-05-29 MED ORDER — SODIUM CHLORIDE 0.9 % IR SOLN
Status: DC | PRN
  Administered 2012-05-29: 18000 mL

## 2012-05-29 MED ORDER — KETOROLAC TROMETHAMINE 30 MG/ML IJ SOLN
15.0000 mg | Freq: Once | INTRAMUSCULAR | Status: DC | PRN
  Filled 2012-05-29: qty 1

## 2012-05-29 MED ORDER — HYDROMORPHONE HCL PF 1 MG/ML IJ SOLN
0.2500 mg | INTRAMUSCULAR | Status: DC | PRN
  Administered 2012-05-29 (×2): 0.25 mg via INTRAVENOUS
  Filled 2012-05-29: qty 1

## 2012-05-29 MED ORDER — HYDROCODONE-ACETAMINOPHEN 10-325 MG PO TABS: 1.0000 | ORAL_TABLET | Freq: Four times a day (QID) | ORAL | Status: DC | PRN

## 2012-05-29 MED ORDER — PHENAZOPYRIDINE HCL 200 MG PO TABS
200.0000 mg | ORAL_TABLET | Freq: Once | ORAL | Status: AC
  Administered 2012-05-29: 200 mg via ORAL
  Filled 2012-05-29: qty 1

## 2012-05-29 MED ORDER — PROMETHAZINE HCL 25 MG/ML IJ SOLN
6.2500 mg | INTRAMUSCULAR | Status: DC | PRN
  Filled 2012-05-29: qty 1

## 2012-05-29 MED ORDER — ACETAMINOPHEN 10 MG/ML IV SOLN
1000.0000 mg | Freq: Four times a day (QID) | INTRAVENOUS | Status: DC
  Administered 2012-05-29: 1000 mg via INTRAVENOUS
  Filled 2012-05-29: qty 100

## 2012-05-29 MED ORDER — LIDOCAINE HCL (CARDIAC) 20 MG/ML IV SOLN
INTRAVENOUS | Status: DC | PRN
  Administered 2012-05-29: 80 mg via INTRAVENOUS

## 2012-05-29 MED ORDER — INDIGOTINDISULFONATE SODIUM 8 MG/ML IJ SOLN
INTRAMUSCULAR | Status: DC | PRN
  Administered 2012-05-29: 5 mL via INTRAVENOUS

## 2012-05-29 MED ORDER — FENTANYL CITRATE 0.05 MG/ML IJ SOLN
INTRAMUSCULAR | Status: DC | PRN
  Administered 2012-05-29: 25 ug via INTRAVENOUS
  Administered 2012-05-29: 50 ug via INTRAVENOUS
  Administered 2012-05-29: 25 ug via INTRAVENOUS
  Administered 2012-05-29: 50 ug via INTRAVENOUS

## 2012-05-29 MED ORDER — LACTATED RINGERS IV SOLN
INTRAVENOUS | Status: DC
  Administered 2012-05-29: 100 mL/h via INTRAVENOUS
  Administered 2012-05-29: 11:00:00 via INTRAVENOUS
  Filled 2012-05-29: qty 1000

## 2012-05-29 MED ORDER — IOHEXOL 350 MG/ML SOLN
INTRAVENOUS | Status: DC | PRN
  Administered 2012-05-29: 7 mL

## 2012-05-29 SURGICAL SUPPLY — 59 items
ADAPTER CATH URET PLST 4-6FR (CATHETERS) IMPLANT
ADPR CATH URET STRL DISP 4-6FR (CATHETERS)
BAG DRAIN URO-CYSTO SKYTR STRL (DRAIN) ×3 IMPLANT
BAG DRN ANRFLXCHMBR STRAP LEK (BAG) ×2
BAG DRN UROCATH (DRAIN) ×2
BAG URINE DRAINAGE (UROLOGICAL SUPPLIES) ×1 IMPLANT
BAG URINE LEG 19OZ MD ST LTX (BAG) ×1 IMPLANT
BASKET LASER NITINOL 1.9FR (BASKET) IMPLANT
BASKET SEGURA 3FR (UROLOGICAL SUPPLIES) IMPLANT
BASKET STNLS GEMINI 4WIRE 3FR (BASKET) IMPLANT
BASKET ZERO TIP NITINOL 2.4FR (BASKET) IMPLANT
BRUSH URET BIOPSY 3F (UROLOGICAL SUPPLIES) IMPLANT
BSKT STON RTRVL 120 1.9FR (BASKET)
BSKT STON RTRVL GEM 120X11 3FR (BASKET)
BSKT STON RTRVL ZERO TP 2.4FR (BASKET)
CANISTER SUCT LVC 12 LTR MEDI- (MISCELLANEOUS) ×2 IMPLANT
CATH FOLEY 2WAY SLVR  5CC 20FR (CATHETERS) ×1
CATH FOLEY 2WAY SLVR  5CC 22FR (CATHETERS)
CATH FOLEY 2WAY SLVR  5CC 24FR (CATHETERS) ×1
CATH FOLEY 2WAY SLVR 5CC 20FR (CATHETERS) IMPLANT
CATH FOLEY 2WAY SLVR 5CC 22FR (CATHETERS) IMPLANT
CATH FOLEY 2WAY SLVR 5CC 24FR (CATHETERS) ×2 IMPLANT
CATH INTERMIT  6FR 70CM (CATHETERS) IMPLANT
CATH URET 5FR 28IN CONE TIP (BALLOONS)
CATH URET 5FR 70CM CONE TIP (BALLOONS) IMPLANT
CLOTH BEACON ORANGE TIMEOUT ST (SAFETY) ×3 IMPLANT
DRAPE CAMERA CLOSED 9X96 (DRAPES) ×3 IMPLANT
ELECT BUTTON HF 24-28F 2 30DE (ELECTRODE) IMPLANT
ELECT LOOP MED HF 24F 12D (CUTTING LOOP) ×1 IMPLANT
ELECT REM PT RETURN 9FT ADLT (ELECTROSURGICAL)
ELECT RESECT VAPORIZE 12D CBL (ELECTRODE) ×2 IMPLANT
ELECTRODE REM PT RTRN 9FT ADLT (ELECTROSURGICAL) ×2 IMPLANT
EVACUATOR MICROVAS BLADDER (UROLOGICAL SUPPLIES) ×1 IMPLANT
GLOVE BIO SURGEON STRL SZ7 (GLOVE) ×2 IMPLANT
GLOVE BIO SURGEON STRL SZ8 (GLOVE) ×3 IMPLANT
GOWN PREVENTION PLUS LG XLONG (DISPOSABLE) ×3 IMPLANT
GOWN STRL REIN XL XLG (GOWN DISPOSABLE) ×3 IMPLANT
GOWN XL W/COTTON TOWEL STD (GOWNS) ×3 IMPLANT
GUIDEWIRE 0.038 PTFE COATED (WIRE) ×3 IMPLANT
GUIDEWIRE ANG ZIPWIRE 038X150 (WIRE) IMPLANT
GUIDEWIRE STR DUAL SENSOR (WIRE) ×1 IMPLANT
HOLDER FOLEY CATH W/STRAP (MISCELLANEOUS) ×1 IMPLANT
IV NS IRRIG 3000ML ARTHROMATIC (IV SOLUTION) ×10 IMPLANT
KIT ASPIRATION TUBING (SET/KITS/TRAYS/PACK) ×1 IMPLANT
KIT BALLIN UROMAX 15FX10 (LABEL) IMPLANT
KIT BALLN UROMAX 15FX4 (MISCELLANEOUS) IMPLANT
KIT BALLN UROMAX 26 75X4 (MISCELLANEOUS)
LASER FIBER DISP (UROLOGICAL SUPPLIES) IMPLANT
LASER FIBER DISP 1000U (UROLOGICAL SUPPLIES) IMPLANT
NS IRRIG 500ML POUR BTL (IV SOLUTION) ×2 IMPLANT
PACK CYSTOSCOPY (CUSTOM PROCEDURE TRAY) ×3 IMPLANT
PLUG CATH AND CAP STER (CATHETERS) ×1 IMPLANT
SET ASPIRATION TUBING (TUBING) IMPLANT
SET HIGH PRES BAL DIL (LABEL)
SHEATH ACCESS URETERAL 38CM (SHEATH) IMPLANT
SHEATH ACCESS URETERAL 54CM (SHEATH) IMPLANT
SYRINGE IRR TOOMEY STRL 70CC (SYRINGE) IMPLANT
WATER STERILE IRR 3000ML UROMA (IV SOLUTION) IMPLANT
WATER STERILE IRR 500ML POUR (IV SOLUTION) ×1 IMPLANT

## 2012-05-29 NOTE — Anesthesia Preprocedure Evaluation (Addendum)
Anesthesia Evaluation  Patient identified by MRN, date of birth, ID band Patient awake    Reviewed: Allergy & Precautions, H&P , NPO status , Patient's Chart, lab work & pertinent test results  Airway Mallampati: II TM Distance: <3 FB Neck ROM: Full    Dental  (+) Dental Advisory Given and Caps   Pulmonary neg pulmonary ROS,  breath sounds clear to auscultation  Pulmonary exam normal       Cardiovascular hypertension, Pt. on medications Rhythm:Regular Rate:Normal     Neuro/Psych Anxiety negative neurological ROS     GI/Hepatic negative GI ROS, Neg liver ROS,   Endo/Other  diabetes, Type 2, Oral Hypoglycemic Agents  Renal/GU negative Renal ROS  negative genitourinary   Musculoskeletal negative musculoskeletal ROS (+)   Abdominal   Peds negative pediatric ROS (+)  Hematology negative hematology ROS (+)   Anesthesia Other Findings   Reproductive/Obstetrics negative OB ROS                          Anesthesia Physical Anesthesia Plan  ASA: II  Anesthesia Plan: General   Post-op Pain Management:    Induction: Intravenous  Airway Management Planned: LMA  Additional Equipment:   Intra-op Plan:   Post-operative Plan:   Informed Consent: I have reviewed the patients History and Physical, chart, labs and discussed the procedure including the risks, benefits and alternatives for the proposed anesthesia with the patient or authorized representative who has indicated his/her understanding and acceptance.   Dental advisory given  Plan Discussed with: CRNA and Surgeon  Anesthesia Plan Comments: (4 LMA used previously)        Anesthesia Quick Evaluation

## 2012-05-29 NOTE — Interval H&P Note (Signed)
History and Physical Interval Note:  05/29/2012 9:24 AM  April Gomez Reasons  has presented today for surgery, with the diagnosis of Bladder Tumor  The various methods of treatment have been discussed with the patient and family. After consideration of risks, benefits and other options for treatment, the patient has consented to  Procedure(s) (LRB) with comments: TRANSURETHRAL RESECTION OF BLADDER TUMOR (TURBT) (N/A) - Gyrus CYSTOSCOPY/RETROGRADE/URETEROSCOPY (Bilateral) - Bilateral Retrograde Pyelogram  as a surgical intervention .  The patient's history has been reviewed, patient examined, no change in status, stable for surgery.  I have reviewed the patient's chart and labs.  Questions were answered to the patient's satisfaction.     Garnett Farm

## 2012-05-29 NOTE — Anesthesia Postprocedure Evaluation (Signed)
  Anesthesia Post-op Note  Patient: April Gomez  Procedure(s) Performed: Procedure(s) (LRB): TRANSURETHRAL RESECTION OF BLADDER TUMOR (TURBT) (N/A) CYSTOSCOPY/RETROGRADE/URETEROSCOPY (Right)  Patient Location: PACU  Anesthesia Type: General  Level of Consciousness: awake and alert   Airway and Oxygen Therapy: Patient Spontanous Breathing  Post-op Pain: mild  Post-op Assessment: Post-op Vital signs reviewed, Patient's Cardiovascular Status Stable, Respiratory Function Stable, Patent Airway and No signs of Nausea or vomiting  Post-op Vital Signs: stable  Complications: No apparent anesthesia complications

## 2012-05-29 NOTE — Transfer of Care (Signed)
Immediate Anesthesia Transfer of Care Note  Patient: April Gomez  Procedure(s) Performed: Procedure(s) (LRB) with comments: TRANSURETHRAL RESECTION OF BLADDER TUMOR (TURBT) (N/A) - Gyrus CYSTOSCOPY/RETROGRADE/URETEROSCOPY (Right) -  right  Retrograde Pyelogram   Patient Location: PACU  Anesthesia Type:General  Level of Consciousness: awake, alert  and oriented  Airway & Oxygen Therapy: Patient Spontanous Breathing and Patient connected to nasal cannula oxygen  Post-op Assessment: Report given to PACU RN  Post vital signs: Reviewed and stable  Complications: No apparent anesthesia complications

## 2012-05-29 NOTE — Anesthesia Procedure Notes (Signed)
Procedure Name: LMA Insertion Date/Time: 05/29/2012 9:34 AM Performed by: Maris Berger T Pre-anesthesia Checklist: Patient identified, Emergency Drugs available, Suction available and Patient being monitored Patient Re-evaluated:Patient Re-evaluated prior to inductionOxygen Delivery Method: Circle System Utilized Preoxygenation: Pre-oxygenation with 100% oxygen Intubation Type: IV induction Ventilation: Mask ventilation without difficulty LMA: LMA inserted LMA Size: 4.0 Number of attempts: 1 Placement Confirmation: positive ETCO2 Dental Injury: Teeth and Oropharynx as per pre-operative assessment  Comments: Gauze roll between teeth

## 2012-05-29 NOTE — Op Note (Signed)
PATIENT:  April Gomez  PRE-OPERATIVE DIAGNOSIS: Bladder tumor  POST-OPERATIVE DIAGNOSIS: Same  PROCEDURE:  Procedure(s): 1.TRANSURETHRAL RESECTION OF BLADDER TUMOR (TURBT) (5.1cm.) 2. Right retrograde pyelogram with interpretation.  SURGEON:  Surgeon(s): Garnett Farm  ANESTHESIA:   General  EBL:  less than 100 mL  DRAINS: Urinary Catheter (20 Fr. Foley)   SPECIMEN:  Source of Specimen:  Bladder tumor  DISPOSITION OF SPECIMEN:  PATHOLOGY  Indication: April Gomez is a 66 year old female who was found to have a 4.4 cm mass within the bladder on ultrasound. This was confirmed cystoscopically. She is brought to the operating room today for resection of her bladder mass as well as evaluation of her upper tracts.  Description of operation: The patient was taken to the operating room and administered general anesthesia. He was then placed on the table and moved to the dorsal lithotomy position after which his genitalia was sterilely prepped and draped. An official timeout was then performed.  Initially I used a 21 French cystoscope and inspected the bladder. I found a very large tumor that was actually located on the left side of the bladder and attached to the left wall that extended posteriorly and onto the floor of the bladder on the left side involving the left ureteral orifice. The right ureteral orifice was identified. I passed a 6 [place] open-ended ureteral catheter through the cystoscope and into the right ureteral orifice in order to perform a right retrograde pyelogram.  A right retrograde pyelogram was performed by injecting full-strength contrast through the open-ended catheter and up the right ureter under direct fluoroscopic control. It revealed some slight tortuosity of the ureter but there were no filling defects or mass effect. The intrarenal collecting system also appeared normal. I searched extensively and also administered indigo carmine intravenously to aid in  identification of the left ureteral orifice but was unable to identify the orifice. She had significant tumor that appeared to be invading the left hemitrigone region. I therefore was unable to perform a left retrograde pyelogram. I removed the cystoscope and proceeded with resection of the tumor.  The 26 French resectoscope with [place] obturator was then introduced into the bladder and the obturator was removed. The resectoscope element with 12 lens was then inserted. I first began by resecting the bulk of the tumor down to the wall of the bladder first inferiorly and then as I progressed laterally I had the patient paralyzed and resected the tumor from the left wall of the bladder. I was able to resect all of the primary tumor. There were 2 satellite lesions that were papillary and located on the left posterior wall and left posterior lateral wall which were resected as well. I then resected the tumor from the floor of the bladder. It appeared to involve the left hemitrigone and resection through this area did not elucidate the left ureteral orifice. I did note blue dye effluxing from the right orifice but could not be sure that I could see any blue coming from the left hemitrigone region. Reinspection of the bladder revealed all obvious tumor had been fully resected and there was no evidence of perforation. The Microvasive evacuator was then used to irrigate the bladder and remove all of the portions of bladder tumor which were sent to pathology. I then removed the resectoscope.  A 20 French Foley catheter was then inserted in the bladder and irrigated. The irrigant returned slightly pink with no clots. The patient was awakened and taken to the recovery room.  I elected not to instill mitomycin-C as the area of resection was so extensive and clinically based on what I could see at the time of the resection it appeared that there was a high likelihood of muscle invasive cancer. At the very least she will  require a second look if she does not have invasive disease.  PLAN OF CARE: Discharge to home after PACU  PATIENT DISPOSITION:  PACU - hemodynamically stable.

## 2012-06-01 ENCOUNTER — Encounter (HOSPITAL_BASED_OUTPATIENT_CLINIC_OR_DEPARTMENT_OTHER): Payer: Self-pay | Admitting: Urology

## 2012-06-05 DIAGNOSIS — C679 Malignant neoplasm of bladder, unspecified: Secondary | ICD-10-CM | POA: Diagnosis not present

## 2012-06-10 DIAGNOSIS — C679 Malignant neoplasm of bladder, unspecified: Secondary | ICD-10-CM | POA: Diagnosis not present

## 2012-06-10 DIAGNOSIS — C670 Malignant neoplasm of trigone of bladder: Secondary | ICD-10-CM | POA: Diagnosis not present

## 2012-06-10 DIAGNOSIS — C672 Malignant neoplasm of lateral wall of bladder: Secondary | ICD-10-CM | POA: Diagnosis not present

## 2012-06-17 DIAGNOSIS — C679 Malignant neoplasm of bladder, unspecified: Secondary | ICD-10-CM | POA: Diagnosis not present

## 2012-07-02 DIAGNOSIS — C679 Malignant neoplasm of bladder, unspecified: Secondary | ICD-10-CM | POA: Diagnosis not present

## 2012-07-02 DIAGNOSIS — K7689 Other specified diseases of liver: Secondary | ICD-10-CM | POA: Diagnosis not present

## 2012-07-06 DIAGNOSIS — C679 Malignant neoplasm of bladder, unspecified: Secondary | ICD-10-CM | POA: Diagnosis not present

## 2012-07-16 DIAGNOSIS — Z79899 Other long term (current) drug therapy: Secondary | ICD-10-CM | POA: Diagnosis not present

## 2012-07-16 DIAGNOSIS — C649 Malignant neoplasm of unspecified kidney, except renal pelvis: Secondary | ICD-10-CM | POA: Diagnosis not present

## 2012-07-16 DIAGNOSIS — I10 Essential (primary) hypertension: Secondary | ICD-10-CM | POA: Diagnosis not present

## 2012-07-16 DIAGNOSIS — C679 Malignant neoplasm of bladder, unspecified: Secondary | ICD-10-CM | POA: Diagnosis not present

## 2012-07-16 DIAGNOSIS — E119 Type 2 diabetes mellitus without complications: Secondary | ICD-10-CM | POA: Diagnosis not present

## 2012-07-17 ENCOUNTER — Other Ambulatory Visit (HOSPITAL_COMMUNITY): Payer: Self-pay | Admitting: *Deleted

## 2012-07-17 ENCOUNTER — Other Ambulatory Visit: Payer: Self-pay | Admitting: *Deleted

## 2012-07-17 DIAGNOSIS — D329 Benign neoplasm of meninges, unspecified: Secondary | ICD-10-CM

## 2012-07-17 DIAGNOSIS — C679 Malignant neoplasm of bladder, unspecified: Secondary | ICD-10-CM

## 2012-07-17 DIAGNOSIS — D1800 Hemangioma unspecified site: Secondary | ICD-10-CM

## 2012-07-27 ENCOUNTER — Other Ambulatory Visit: Payer: Medicare Other

## 2012-07-27 ENCOUNTER — Ambulatory Visit (HOSPITAL_COMMUNITY)
Admission: RE | Admit: 2012-07-27 | Discharge: 2012-07-27 | Disposition: A | Discharge status: Home / Self Care | Payer: Medicare Other | Source: Ambulatory Visit | Attending: Internal Medicine | Admitting: Internal Medicine

## 2012-07-27 DIAGNOSIS — K7689 Other specified diseases of liver: Secondary | ICD-10-CM | POA: Diagnosis not present

## 2012-07-27 DIAGNOSIS — N281 Cyst of kidney, acquired: Secondary | ICD-10-CM | POA: Diagnosis not present

## 2012-07-27 DIAGNOSIS — C679 Malignant neoplasm of bladder, unspecified: Secondary | ICD-10-CM | POA: Diagnosis not present

## 2012-07-27 DIAGNOSIS — D1800 Hemangioma unspecified site: Secondary | ICD-10-CM

## 2012-07-27 LAB — POCT I-STAT, CHEM 8
BUN: 17 mg/dL (ref 6–23)
Calcium, Ion: 1.21 mmol/L (ref 1.13–1.30)
Chloride: 108 mEq/L (ref 96–112)
HCT: 43 % (ref 36.0–46.0)
Sodium: 140 mEq/L (ref 135–145)

## 2012-07-27 MED ORDER — GADOBENATE DIMEGLUMINE 529 MG/ML IV SOLN
14.0000 mL | Freq: Once | INTRAVENOUS | Status: AC | PRN
  Administered 2012-07-27: 14 mL via INTRAVENOUS

## 2012-07-27 NOTE — Progress Notes (Signed)
Blood sample obtained from right arm IV for Creatnine level.  

## 2012-07-30 DIAGNOSIS — N281 Cyst of kidney, acquired: Secondary | ICD-10-CM | POA: Diagnosis not present

## 2012-07-30 DIAGNOSIS — C679 Malignant neoplasm of bladder, unspecified: Secondary | ICD-10-CM | POA: Diagnosis not present

## 2012-08-06 DIAGNOSIS — C679 Malignant neoplasm of bladder, unspecified: Secondary | ICD-10-CM | POA: Diagnosis not present

## 2012-08-06 DIAGNOSIS — I10 Essential (primary) hypertension: Secondary | ICD-10-CM | POA: Diagnosis not present

## 2012-08-06 DIAGNOSIS — E109 Type 1 diabetes mellitus without complications: Secondary | ICD-10-CM | POA: Diagnosis not present

## 2012-08-06 DIAGNOSIS — E785 Hyperlipidemia, unspecified: Secondary | ICD-10-CM | POA: Diagnosis not present

## 2012-09-08 DIAGNOSIS — I7090 Unspecified atherosclerosis: Secondary | ICD-10-CM | POA: Diagnosis not present

## 2012-09-08 DIAGNOSIS — N289 Disorder of kidney and ureter, unspecified: Secondary | ICD-10-CM | POA: Diagnosis not present

## 2012-09-08 DIAGNOSIS — C679 Malignant neoplasm of bladder, unspecified: Secondary | ICD-10-CM | POA: Diagnosis not present

## 2012-09-08 DIAGNOSIS — N390 Urinary tract infection, site not specified: Secondary | ICD-10-CM | POA: Diagnosis not present

## 2012-09-08 DIAGNOSIS — K7689 Other specified diseases of liver: Secondary | ICD-10-CM | POA: Diagnosis not present

## 2012-09-14 DIAGNOSIS — C672 Malignant neoplasm of lateral wall of bladder: Secondary | ICD-10-CM | POA: Diagnosis not present

## 2012-09-14 DIAGNOSIS — E119 Type 2 diabetes mellitus without complications: Secondary | ICD-10-CM | POA: Diagnosis not present

## 2012-09-14 DIAGNOSIS — N200 Calculus of kidney: Secondary | ICD-10-CM | POA: Diagnosis not present

## 2012-09-14 DIAGNOSIS — D3000 Benign neoplasm of unspecified kidney: Secondary | ICD-10-CM | POA: Diagnosis not present

## 2012-09-14 DIAGNOSIS — I10 Essential (primary) hypertension: Secondary | ICD-10-CM | POA: Diagnosis not present

## 2012-09-14 DIAGNOSIS — Z881 Allergy status to other antibiotic agents status: Secondary | ICD-10-CM | POA: Diagnosis not present

## 2012-09-14 DIAGNOSIS — F411 Generalized anxiety disorder: Secondary | ICD-10-CM | POA: Diagnosis not present

## 2012-09-14 DIAGNOSIS — C679 Malignant neoplasm of bladder, unspecified: Secondary | ICD-10-CM | POA: Diagnosis not present

## 2012-09-14 DIAGNOSIS — Z87442 Personal history of urinary calculi: Secondary | ICD-10-CM | POA: Diagnosis not present

## 2012-09-14 DIAGNOSIS — Z87891 Personal history of nicotine dependence: Secondary | ICD-10-CM | POA: Diagnosis not present

## 2012-09-25 DIAGNOSIS — D62 Acute posthemorrhagic anemia: Secondary | ICD-10-CM | POA: Diagnosis not present

## 2012-09-25 DIAGNOSIS — Z01810 Encounter for preprocedural cardiovascular examination: Secondary | ICD-10-CM | POA: Diagnosis not present

## 2012-09-25 DIAGNOSIS — F411 Generalized anxiety disorder: Secondary | ICD-10-CM | POA: Diagnosis present

## 2012-09-25 DIAGNOSIS — C679 Malignant neoplasm of bladder, unspecified: Secondary | ICD-10-CM | POA: Diagnosis not present

## 2012-09-25 DIAGNOSIS — F329 Major depressive disorder, single episode, unspecified: Secondary | ICD-10-CM | POA: Diagnosis present

## 2012-09-25 DIAGNOSIS — Z01818 Encounter for other preprocedural examination: Secondary | ICD-10-CM | POA: Diagnosis not present

## 2012-09-25 DIAGNOSIS — E876 Hypokalemia: Secondary | ICD-10-CM | POA: Diagnosis not present

## 2012-09-25 DIAGNOSIS — C50919 Malignant neoplasm of unspecified site of unspecified female breast: Secondary | ICD-10-CM | POA: Diagnosis not present

## 2012-09-25 DIAGNOSIS — I10 Essential (primary) hypertension: Secondary | ICD-10-CM | POA: Diagnosis present

## 2012-09-25 DIAGNOSIS — Z87891 Personal history of nicotine dependence: Secondary | ICD-10-CM | POA: Diagnosis not present

## 2012-09-25 DIAGNOSIS — D72829 Elevated white blood cell count, unspecified: Secondary | ICD-10-CM | POA: Diagnosis not present

## 2012-09-25 DIAGNOSIS — R197 Diarrhea, unspecified: Secondary | ICD-10-CM | POA: Diagnosis not present

## 2012-09-25 DIAGNOSIS — D638 Anemia in other chronic diseases classified elsewhere: Secondary | ICD-10-CM | POA: Diagnosis present

## 2012-10-22 DIAGNOSIS — C679 Malignant neoplasm of bladder, unspecified: Secondary | ICD-10-CM | POA: Diagnosis not present

## 2012-10-29 DIAGNOSIS — I10 Essential (primary) hypertension: Secondary | ICD-10-CM | POA: Diagnosis not present

## 2012-10-29 DIAGNOSIS — F329 Major depressive disorder, single episode, unspecified: Secondary | ICD-10-CM | POA: Diagnosis not present

## 2012-10-29 DIAGNOSIS — E109 Type 1 diabetes mellitus without complications: Secondary | ICD-10-CM | POA: Diagnosis not present

## 2012-10-29 DIAGNOSIS — E785 Hyperlipidemia, unspecified: Secondary | ICD-10-CM | POA: Diagnosis not present

## 2012-11-05 DIAGNOSIS — E109 Type 1 diabetes mellitus without complications: Secondary | ICD-10-CM | POA: Diagnosis not present

## 2012-11-05 DIAGNOSIS — E785 Hyperlipidemia, unspecified: Secondary | ICD-10-CM | POA: Diagnosis not present

## 2012-11-05 DIAGNOSIS — F329 Major depressive disorder, single episode, unspecified: Secondary | ICD-10-CM | POA: Diagnosis not present

## 2012-11-05 DIAGNOSIS — I10 Essential (primary) hypertension: Secondary | ICD-10-CM | POA: Diagnosis not present

## 2012-11-12 DIAGNOSIS — C679 Malignant neoplasm of bladder, unspecified: Secondary | ICD-10-CM | POA: Diagnosis not present

## 2012-11-12 DIAGNOSIS — E785 Hyperlipidemia, unspecified: Secondary | ICD-10-CM | POA: Diagnosis not present

## 2012-11-12 DIAGNOSIS — E109 Type 1 diabetes mellitus without complications: Secondary | ICD-10-CM | POA: Diagnosis not present

## 2012-11-12 DIAGNOSIS — I10 Essential (primary) hypertension: Secondary | ICD-10-CM | POA: Diagnosis not present

## 2012-11-18 DIAGNOSIS — E785 Hyperlipidemia, unspecified: Secondary | ICD-10-CM | POA: Diagnosis not present

## 2012-11-18 DIAGNOSIS — E109 Type 1 diabetes mellitus without complications: Secondary | ICD-10-CM | POA: Diagnosis not present

## 2012-11-18 DIAGNOSIS — I10 Essential (primary) hypertension: Secondary | ICD-10-CM | POA: Diagnosis not present

## 2012-11-26 ENCOUNTER — Other Ambulatory Visit (HOSPITAL_COMMUNITY): Payer: Self-pay | Admitting: Pulmonary Disease

## 2012-11-26 DIAGNOSIS — Z139 Encounter for screening, unspecified: Secondary | ICD-10-CM

## 2012-12-03 ENCOUNTER — Ambulatory Visit (HOSPITAL_COMMUNITY)
Admission: RE | Admit: 2012-12-03 | Discharge: 2012-12-03 | Disposition: A | Discharge status: Home / Self Care | Payer: Medicare Other | Source: Ambulatory Visit | Attending: Pulmonary Disease | Admitting: Pulmonary Disease

## 2012-12-03 DIAGNOSIS — Z1231 Encounter for screening mammogram for malignant neoplasm of breast: Secondary | ICD-10-CM | POA: Diagnosis not present

## 2012-12-03 DIAGNOSIS — Z139 Encounter for screening, unspecified: Secondary | ICD-10-CM

## 2012-12-10 DIAGNOSIS — E039 Hypothyroidism, unspecified: Secondary | ICD-10-CM | POA: Diagnosis not present

## 2012-12-10 DIAGNOSIS — E109 Type 1 diabetes mellitus without complications: Secondary | ICD-10-CM | POA: Diagnosis not present

## 2012-12-10 DIAGNOSIS — I10 Essential (primary) hypertension: Secondary | ICD-10-CM | POA: Diagnosis not present

## 2012-12-10 DIAGNOSIS — D649 Anemia, unspecified: Secondary | ICD-10-CM | POA: Diagnosis not present

## 2012-12-11 DIAGNOSIS — D649 Anemia, unspecified: Secondary | ICD-10-CM | POA: Diagnosis not present

## 2012-12-11 DIAGNOSIS — E039 Hypothyroidism, unspecified: Secondary | ICD-10-CM | POA: Diagnosis not present

## 2012-12-11 DIAGNOSIS — E109 Type 1 diabetes mellitus without complications: Secondary | ICD-10-CM | POA: Diagnosis not present

## 2012-12-11 DIAGNOSIS — I10 Essential (primary) hypertension: Secondary | ICD-10-CM | POA: Diagnosis not present

## 2012-12-24 ENCOUNTER — Encounter (HOSPITAL_COMMUNITY): Payer: Self-pay | Admitting: Emergency Medicine

## 2012-12-24 ENCOUNTER — Emergency Department (HOSPITAL_COMMUNITY)
Admission: EM | Admit: 2012-12-24 | Discharge: 2012-12-25 | Disposition: A | Discharge status: Home / Self Care | Payer: Medicare Other | Attending: Emergency Medicine | Admitting: Emergency Medicine

## 2012-12-24 DIAGNOSIS — Z9851 Tubal ligation status: Secondary | ICD-10-CM | POA: Insufficient documentation

## 2012-12-24 DIAGNOSIS — Z90710 Acquired absence of both cervix and uterus: Secondary | ICD-10-CM | POA: Diagnosis not present

## 2012-12-24 DIAGNOSIS — Z8551 Personal history of malignant neoplasm of bladder: Secondary | ICD-10-CM | POA: Diagnosis not present

## 2012-12-24 DIAGNOSIS — N8110 Cystocele, unspecified: Secondary | ICD-10-CM | POA: Diagnosis not present

## 2012-12-24 DIAGNOSIS — IMO0002 Reserved for concepts with insufficient information to code with codable children: Secondary | ICD-10-CM | POA: Diagnosis not present

## 2012-12-24 DIAGNOSIS — I10 Essential (primary) hypertension: Secondary | ICD-10-CM | POA: Insufficient documentation

## 2012-12-24 DIAGNOSIS — Z87891 Personal history of nicotine dependence: Secondary | ICD-10-CM | POA: Insufficient documentation

## 2012-12-24 DIAGNOSIS — E785 Hyperlipidemia, unspecified: Secondary | ICD-10-CM | POA: Diagnosis not present

## 2012-12-24 DIAGNOSIS — K5900 Constipation, unspecified: Secondary | ICD-10-CM | POA: Insufficient documentation

## 2012-12-24 DIAGNOSIS — E119 Type 2 diabetes mellitus without complications: Secondary | ICD-10-CM | POA: Diagnosis not present

## 2012-12-24 DIAGNOSIS — Z79899 Other long term (current) drug therapy: Secondary | ICD-10-CM | POA: Diagnosis not present

## 2012-12-24 DIAGNOSIS — R011 Cardiac murmur, unspecified: Secondary | ICD-10-CM | POA: Diagnosis not present

## 2012-12-24 DIAGNOSIS — Z8719 Personal history of other diseases of the digestive system: Secondary | ICD-10-CM | POA: Insufficient documentation

## 2012-12-24 NOTE — ED Notes (Signed)
Pt states she felt as though she has some pressure coming from her vaginal area. When she went to wipe after a BM patient noted a protrusion coming from vaginal area.

## 2012-12-25 ENCOUNTER — Telehealth: Payer: Self-pay | Admitting: Adult Health

## 2012-12-25 ENCOUNTER — Encounter: Payer: Self-pay | Admitting: Adult Health

## 2012-12-25 ENCOUNTER — Ambulatory Visit (INDEPENDENT_AMBULATORY_CARE_PROVIDER_SITE_OTHER): Payer: Medicare Other | Admitting: Adult Health

## 2012-12-25 VITALS — BP 150/62 | Ht 65.0 in | Wt 142.0 lb

## 2012-12-25 DIAGNOSIS — N993 Prolapse of vaginal vault after hysterectomy: Secondary | ICD-10-CM

## 2012-12-25 DIAGNOSIS — Z8551 Personal history of malignant neoplasm of bladder: Secondary | ICD-10-CM

## 2012-12-25 DIAGNOSIS — K5900 Constipation, unspecified: Secondary | ICD-10-CM | POA: Diagnosis not present

## 2012-12-25 HISTORY — DX: Prolapse of vaginal vault after hysterectomy: N99.3

## 2012-12-25 HISTORY — DX: Personal history of malignant neoplasm of bladder: Z85.51

## 2012-12-25 NOTE — ED Provider Notes (Signed)
History     CSN: [id number]  Arrival date & time 12/24/12  2325   First MD Initiated Contact with Patient 12/25/12 0011      Chief Complaint  Patient presents with  . Vaginal Prolapse    (Consider location/radiation/quality/duration/timing/severity/associated sxs/prior treatment) HPI Comments: April Gomez is a 67 y.o. Female presenting with vaginal pressure and prolapse starting over the past several days.  She has had increased constipation and is aware she has been straining more to have a bowel movement the past week.  She is taking generic pericolace and has had several softer stools this evening but has noticed a full sensation in her vagina with new onset of vaginal prolapse tonight.  She does have a history of bladder cancer and had a cystectomy and total hysterectomy 3/14 and now wears a urostomy pouch.  She denies any changes in her urine flow, she denies abdominal or pelvic pain, has had no fevers, chills, nausea, vomiting and no vaginal discharge or bleeding.     The history is provided by the patient.    Past Medical History  Diagnosis Date  . Hypertension   . IBD (inflammatory bowel disease)   . Hyperlipidemia   . Diabetes mellitus   . Bladder tumor   . Heart murmur MILD-  ASYMPTOMATIC  . Urgency of urination   . Frequency of urination   . Hematuria   . DDD (degenerative disc disease) CERVICAL AND LUMBAR    Past Surgical History  Procedure Laterality Date  . Bunionectomy  1980'S  . Hysteroscopy w/d&c  04/17/2012    Procedure: DILATATION AND CURETTAGE /HYSTEROSCOPY;  Surgeon: Tilda Burrow, MD;  Location: [facility];  Service: Gynecology;  Laterality: N/A;  POLYPECTOMY  . Tubal ligation  1984  . Breast biopsy  03-20-2011  [facility] [place]    BENIGN LEFT BREAST MASS  . Transurethral resection of bladder tumor  05/29/2012    Procedure: TRANSURETHRAL RESECTION OF BLADDER TUMOR (TURBT);  Surgeon: Garnett Farm, MD;  Location: [facility];  Service:  Urology;  Laterality: N/A;  [facility]  . Cystoscopy/retrograde/ureteroscopy  05/29/2012    Procedure: CYSTOSCOPY/RETROGRADE/URETEROSCOPY;  Surgeon: Garnett Farm, MD;  Location: [facility];  Service: Urology;  Laterality: Right;   right  Retrograde Pyelogram     Family History  Problem Relation Age of Onset  . Heart disease Mother   . Heart disease Father   . Cancer Sister     breast   . Diabetes Brother   . Cancer Brother     prostate    History  Substance Use Topics  . Smoking status: Former Smoker -- 10 years    Types: Cigarettes    Quit date: 05/26/1988  . Smokeless tobacco: Never Used  . Alcohol Use: No    OB History   Grav Para Term Preterm Abortions TAB SAB Ect Mult Living                  Review of Systems  Constitutional: Negative for fever.  HENT: Negative for congestion, sore throat and neck pain.   Eyes: Negative.   Respiratory: Negative for chest tightness and shortness of breath.   Cardiovascular: Negative for chest pain.  Gastrointestinal: Negative for nausea and abdominal pain.  Genitourinary: Negative.  Negative for urgency, vaginal bleeding, vaginal discharge, difficulty urinating, vaginal pain and pelvic pain.  Musculoskeletal: Negative for joint swelling and arthralgias.  Skin: Negative.  Negative for rash and wound.  Neurological: Negative for  dizziness, weakness, light-headedness, numbness and headaches.  Psychiatric/Behavioral: Negative.     Allergies  Ceftin  Home Medications   Current Outpatient Rx  Name  Route  Sig  Dispense  Refill  . ALPRAZolam (XANAX) 1 MG tablet   Oral   Take 1 mg by mouth 3 (three) times daily as needed. For anxiety         . amLODipine (NORVASC) 10 MG tablet   Oral   Take 10 mg by mouth at bedtime.          . Calcium Carbonate-Vitamin D (CALCIUM 500 + D PO)   Oral   Take 1 tablet by mouth 3 (three) times daily.          . Coenzyme Q10 (COQ10) 100 MG CAPS   Oral   Take 1 capsule by  mouth daily.         Marland Kitchen HYDROcodone-acetaminophen (NORCO) 10-325 MG per tablet   Oral   Take 1 tablet by mouth every 6 (six) hours as needed for pain.   30 tablet   0   . HYDROcodone-acetaminophen (VICODIN) 5-500 MG per tablet   Oral   Take 1 tablet by mouth every 6 (six) hours as needed for pain.   30 tablet   0   . ibuprofen (ADVIL,MOTRIN) 600 MG tablet   Oral   Take 1 tablet (600 mg total) by mouth every 6 (six) hours as needed for pain.   30 tablet   1   . loratadine (CLARITIN) 10 MG tablet   Oral   Take 10 mg by mouth as needed.          . Mesalamine (ASACOL HD) 800 MG TBEC   Oral   Take 1 tablet by mouth 2 (two) times daily.         . metFORMIN (GLUCOPHAGE-XR) 500 MG 24 hr tablet   Oral   Take 500 mg by mouth every evening.          . Multiple Vitamins-Minerals (CENTRUM SILVER PO)   Oral   Take 1 tablet by mouth daily.          . nitrofurantoin, macrocrystal-monohydrate, (MACROBID) 100 MG capsule   Oral   Take 1 capsule (100 mg total) by mouth 1 day or 1 dose.   14 capsule   0   . phenazopyridine (PYRIDIUM) 200 MG tablet   Oral   Take 1 tablet (200 mg total) by mouth 3 (three) times daily as needed for pain.   30 tablet   0   . pravastatin (PRAVACHOL) 40 MG tablet   Oral   Take 40 mg by mouth every morning.          . ramipril (ALTACE) 10 MG capsule   Oral   Take 10 mg by mouth every morning.          . tolterodine (DETROL LA) 4 MG 24 hr capsule   Oral   Take 1 capsule (4 mg total) by mouth daily.   30 capsule   0     BP 172/59  Pulse 91  Temp(Src) 98.3 F (36.8 C) (Oral)  Resp 20  Ht 5\' 5"  (1.651 m)  Wt 140 lb (63.504 kg)  BMI 23.3 kg/m2  SpO2 99%  Physical Exam  Nursing note and vitals reviewed. Constitutional: She appears well-developed and well-nourished.  HENT:  Head: Normocephalic and atraumatic.  Eyes: Conjunctivae are normal.  Neck: Normal range of motion.  Cardiovascular: Normal rate, regular rhythm, normal  heart sounds and intact distal pulses.   Pulmonary/Chest: Effort normal and breath sounds normal.  Abdominal: Soft. Bowel sounds are normal. She exhibits no distension. There is no tenderness. There is no rebound and no guarding.  Genitourinary: Rectal exam shows no tenderness.  Anterior vaginal wall is prolapse to the introitus,  Soft,  nontender and easily reduces.  Rectal exam negative for impaction. Urostomy site is clean, clear urine in bag.  Musculoskeletal: Normal range of motion.  Neurological: She is alert.  Skin: Skin is warm and dry.  Psychiatric: She has a normal mood and affect.    ED Course  Procedures (including critical care time)  Labs Reviewed - No data to display No results found.   1. Vaginal wall prolapse       MDM  Pt advised f/u with her gynecologist - see's [facility] here in [place], advised call in am for recheck tomorrow - gyn can advise further and involve her surgeons at [facility] as they deem appropriate.  Pt reassured.   Agrees with plan.        Burgess Amor, PA-C 12/25/12 0117

## 2012-12-25 NOTE — Patient Instructions (Addendum)
Call prn Use tampon to hold prolapse up Try diet as discussed

## 2012-12-25 NOTE — Progress Notes (Signed)
Subjective:     Patient ID: April Gomez, female   DOB: [birth year], 67 y.o.   MRN: [id number]  HPI Kortney is a 67 year old black female who had a bladder resection and hysterectomy in March at [facility], she had a bulge and went to the ER last night.She noticed this after BM.  Review of Systems Positives as in HPI  Reviewed past medical,surgical, social and family history. Reviewed medications and allergies.     Objective:   Physical Exam Blood pressure 150/62, height 5\' 5"  (1.651 m), weight 142 lb (64.411 kg). Pelvic: external genitalia normal for age, vaginal has anterior vaginal wall prolapse,cervix and uterus are absent, no masses or tenderness noted.Dr. Despina Hidden in for co exam. He does not think a pessary will work due to vaginal shape.She requested a breast exam  And there was no dominant mass, retraction tor nipple discharge, her left nipple is inverted and she has a scar at about 11 oclock.   She has a urostomy bag. Assessment:      Vaginal Prolapse Constipation   History bladder cancer Plan:      Can try tampon to hold prolapse up Try luvena for moisture Try 1/2 cup applesauce, 1/2 cup real oatmeal and 1/2 cup prune juice or 3-5 prunes every day can try miralax Call prn problems

## 2012-12-25 NOTE — Telephone Encounter (Signed)
Has vaginal wall prolapse,seen in ER this am.Had bladder surgery at [facility].To come in today at 12:30

## 2012-12-25 NOTE — ED Provider Notes (Signed)
Medical screening examination/treatment/procedure(s) were performed by non-physician practitioner and as supervising physician I was immediately available for consultation/collaboration.  Sunnie Nielsen, MD 12/25/12 [phone]

## 2013-02-04 ENCOUNTER — Ambulatory Visit (HOSPITAL_COMMUNITY)
Admission: RE | Admit: 2013-02-04 | Discharge: 2013-02-04 | Disposition: A | Discharge status: Home / Self Care | Payer: Medicare Other | Source: Ambulatory Visit | Attending: Pulmonary Disease | Admitting: Pulmonary Disease

## 2013-02-04 ENCOUNTER — Other Ambulatory Visit (HOSPITAL_COMMUNITY): Payer: Self-pay | Admitting: Pulmonary Disease

## 2013-02-04 DIAGNOSIS — M79605 Pain in left leg: Secondary | ICD-10-CM

## 2013-02-04 DIAGNOSIS — M79609 Pain in unspecified limb: Secondary | ICD-10-CM | POA: Insufficient documentation

## 2013-02-04 DIAGNOSIS — M79604 Pain in right leg: Secondary | ICD-10-CM

## 2013-02-04 DIAGNOSIS — R609 Edema, unspecified: Secondary | ICD-10-CM | POA: Insufficient documentation

## 2013-02-04 DIAGNOSIS — M7989 Other specified soft tissue disorders: Secondary | ICD-10-CM

## 2013-02-04 DIAGNOSIS — F411 Generalized anxiety disorder: Secondary | ICD-10-CM | POA: Diagnosis not present

## 2013-02-04 DIAGNOSIS — I10 Essential (primary) hypertension: Secondary | ICD-10-CM | POA: Diagnosis not present

## 2013-02-09 DIAGNOSIS — D649 Anemia, unspecified: Secondary | ICD-10-CM | POA: Diagnosis not present

## 2013-02-09 DIAGNOSIS — E109 Type 1 diabetes mellitus without complications: Secondary | ICD-10-CM | POA: Diagnosis not present

## 2013-02-09 DIAGNOSIS — F329 Major depressive disorder, single episode, unspecified: Secondary | ICD-10-CM | POA: Diagnosis not present

## 2013-02-09 DIAGNOSIS — R609 Edema, unspecified: Secondary | ICD-10-CM | POA: Diagnosis not present

## 2013-02-09 DIAGNOSIS — I10 Essential (primary) hypertension: Secondary | ICD-10-CM | POA: Diagnosis not present

## 2013-03-23 DIAGNOSIS — N8110 Cystocele, unspecified: Secondary | ICD-10-CM | POA: Diagnosis not present

## 2013-03-23 DIAGNOSIS — C679 Malignant neoplasm of bladder, unspecified: Secondary | ICD-10-CM | POA: Diagnosis not present

## 2013-03-23 DIAGNOSIS — N1330 Unspecified hydronephrosis: Secondary | ICD-10-CM | POA: Diagnosis not present

## 2013-04-13 DIAGNOSIS — D649 Anemia, unspecified: Secondary | ICD-10-CM | POA: Diagnosis not present

## 2013-04-13 DIAGNOSIS — F329 Major depressive disorder, single episode, unspecified: Secondary | ICD-10-CM | POA: Diagnosis not present

## 2013-04-13 DIAGNOSIS — E109 Type 1 diabetes mellitus without complications: Secondary | ICD-10-CM | POA: Diagnosis not present

## 2013-04-13 DIAGNOSIS — I10 Essential (primary) hypertension: Secondary | ICD-10-CM | POA: Diagnosis not present

## 2013-07-13 DIAGNOSIS — E109 Type 1 diabetes mellitus without complications: Secondary | ICD-10-CM | POA: Diagnosis not present

## 2013-07-13 DIAGNOSIS — R0602 Shortness of breath: Secondary | ICD-10-CM | POA: Diagnosis not present

## 2013-07-13 DIAGNOSIS — R609 Edema, unspecified: Secondary | ICD-10-CM | POA: Diagnosis not present

## 2013-07-13 DIAGNOSIS — I10 Essential (primary) hypertension: Secondary | ICD-10-CM | POA: Diagnosis not present

## 2013-07-20 ENCOUNTER — Ambulatory Visit (HOSPITAL_COMMUNITY)
Admission: RE | Admit: 2013-07-20 | Discharge: 2013-07-20 | Disposition: A | Discharge status: Home / Self Care | Payer: Medicare Other | Source: Ambulatory Visit | Attending: Pulmonary Disease | Admitting: Pulmonary Disease

## 2013-07-20 DIAGNOSIS — R609 Edema, unspecified: Secondary | ICD-10-CM | POA: Diagnosis not present

## 2013-07-20 DIAGNOSIS — I059 Rheumatic mitral valve disease, unspecified: Secondary | ICD-10-CM | POA: Diagnosis not present

## 2013-07-20 DIAGNOSIS — E119 Type 2 diabetes mellitus without complications: Secondary | ICD-10-CM | POA: Insufficient documentation

## 2013-07-20 DIAGNOSIS — R011 Cardiac murmur, unspecified: Secondary | ICD-10-CM | POA: Insufficient documentation

## 2013-07-20 DIAGNOSIS — I10 Essential (primary) hypertension: Secondary | ICD-10-CM | POA: Diagnosis not present

## 2013-07-20 DIAGNOSIS — Z87891 Personal history of nicotine dependence: Secondary | ICD-10-CM | POA: Insufficient documentation

## 2013-07-20 DIAGNOSIS — E785 Hyperlipidemia, unspecified: Secondary | ICD-10-CM | POA: Insufficient documentation

## 2013-07-20 NOTE — Progress Notes (Signed)
*  PRELIMINARY RESULTS* Echocardiogram 2D Echocardiogram has been performed.  April Gomez 07/20/2013, 11:47 AM

## 2013-10-11 ENCOUNTER — Emergency Department (HOSPITAL_COMMUNITY)
Admission: EM | Admit: 2013-10-11 | Discharge: 2013-10-11 | Disposition: A | Discharge status: Home / Self Care | Payer: Medicare Other | Attending: Emergency Medicine | Admitting: Emergency Medicine

## 2013-10-11 ENCOUNTER — Encounter (HOSPITAL_COMMUNITY): Payer: Self-pay | Admitting: Emergency Medicine

## 2013-10-11 DIAGNOSIS — I10 Essential (primary) hypertension: Secondary | ICD-10-CM | POA: Diagnosis not present

## 2013-10-11 DIAGNOSIS — Z8739 Personal history of other diseases of the musculoskeletal system and connective tissue: Secondary | ICD-10-CM | POA: Insufficient documentation

## 2013-10-11 DIAGNOSIS — Z87891 Personal history of nicotine dependence: Secondary | ICD-10-CM | POA: Insufficient documentation

## 2013-10-11 DIAGNOSIS — R011 Cardiac murmur, unspecified: Secondary | ICD-10-CM | POA: Diagnosis not present

## 2013-10-11 DIAGNOSIS — Z7982 Long term (current) use of aspirin: Secondary | ICD-10-CM | POA: Insufficient documentation

## 2013-10-11 DIAGNOSIS — Z8551 Personal history of malignant neoplasm of bladder: Secondary | ICD-10-CM | POA: Insufficient documentation

## 2013-10-11 DIAGNOSIS — Z79899 Other long term (current) drug therapy: Secondary | ICD-10-CM | POA: Insufficient documentation

## 2013-10-11 DIAGNOSIS — E119 Type 2 diabetes mellitus without complications: Secondary | ICD-10-CM | POA: Diagnosis not present

## 2013-10-11 DIAGNOSIS — N390 Urinary tract infection, site not specified: Secondary | ICD-10-CM | POA: Insufficient documentation

## 2013-10-11 LAB — CBC WITH DIFFERENTIAL/PLATELET
Basophils Absolute: 0 10*3/uL (ref 0.0–0.1)
Basophils Relative: 0 % (ref 0–1)
EOS ABS: 0 10*3/uL (ref 0.0–0.7)
Eosinophils Relative: 0 % (ref 0–5)
HCT: 36.1 % (ref 36.0–46.0)
HEMOGLOBIN: 12 g/dL (ref 12.0–15.0)
LYMPHS ABS: 0.5 10*3/uL — AB (ref 0.7–4.0)
LYMPHS PCT: 4 % — AB (ref 12–46)
MCH: 29.3 pg (ref 26.0–34.0)
MCHC: 33.2 g/dL (ref 30.0–36.0)
MCV: 88 fL (ref 78.0–100.0)
MONOS PCT: 8 % (ref 3–12)
Monocytes Absolute: 1.2 10*3/uL — ABNORMAL HIGH (ref 0.1–1.0)
NEUTROS ABS: 12.6 10*3/uL — AB (ref 1.7–7.7)
NEUTROS PCT: 88 % — AB (ref 43–77)
Platelets: 439 10*3/uL — ABNORMAL HIGH (ref 150–400)
RBC: 4.1 MIL/uL (ref 3.87–5.11)
RDW: 14 % (ref 11.5–15.5)
WBC: 14.3 10*3/uL — AB (ref 4.0–10.5)

## 2013-10-11 LAB — URINALYSIS, ROUTINE W REFLEX MICROSCOPIC
BILIRUBIN URINE: NEGATIVE
Glucose, UA: NEGATIVE mg/dL
Ketones, ur: 40 mg/dL — AB
NITRITE: POSITIVE — AB
PH: 6 (ref 5.0–8.0)
Protein, ur: 30 mg/dL — AB
SPECIFIC GRAVITY, URINE: 1.015 (ref 1.005–1.030)
Urobilinogen, UA: 0.2 mg/dL (ref 0.0–1.0)

## 2013-10-11 LAB — BASIC METABOLIC PANEL
BUN: 11 mg/dL (ref 6–23)
CHLORIDE: 95 meq/L — AB (ref 96–112)
CO2: 25 meq/L (ref 19–32)
Calcium: 9.8 mg/dL (ref 8.4–10.5)
Creatinine, Ser: 0.7 mg/dL (ref 0.50–1.10)
GFR calc Af Amer: >90 mL/min (ref >90)
GFR calc non Af Amer: 86 mL/min — ABNORMAL LOW (ref >90)
GLUCOSE: 200 mg/dL — AB (ref 70–99)
POTASSIUM: 3.8 meq/L (ref 3.7–5.3)
Sodium: 139 mEq/L (ref 137–147)

## 2013-10-11 LAB — URINE MICROSCOPIC-ADD ON

## 2013-10-11 LAB — CBG MONITORING, ED: Glucose-Capillary: 189 mg/dL — ABNORMAL HIGH (ref 70–99)

## 2013-10-11 MED ORDER — CIPROFLOXACIN HCL 500 MG PO TABS: 500.0000 mg | ORAL_TABLET | Freq: Two times a day (BID) | ORAL | Status: DC

## 2013-10-11 MED ORDER — ACETAMINOPHEN 325 MG PO TABS
650.0000 mg | ORAL_TABLET | Freq: Once | ORAL | Status: AC
  Administered 2013-10-11: 650 mg via ORAL
  Filled 2013-10-11: qty 2

## 2013-10-11 MED ORDER — CIPROFLOXACIN IN D5W 400 MG/200ML IV SOLN
400.0000 mg | Freq: Once | INTRAVENOUS | Status: AC
  Administered 2013-10-11: 400 mg via INTRAVENOUS
  Filled 2013-10-11: qty 200

## 2013-10-11 MED ORDER — SODIUM CHLORIDE 0.9 % IV BOLUS (SEPSIS)
1000.0000 mL | Freq: Once | INTRAVENOUS | Status: AC
  Administered 2013-10-11: 1000 mL via INTRAVENOUS

## 2013-10-11 MED ORDER — ONDANSETRON HCL 4 MG/2ML IJ SOLN
4.0000 mg | Freq: Once | INTRAMUSCULAR | Status: AC
  Administered 2013-10-11: 4 mg via INTRAVENOUS

## 2013-10-11 MED ORDER — ONDANSETRON HCL 4 MG/2ML IJ SOLN
4.0000 mg | Freq: Once | INTRAMUSCULAR | Status: DC
  Filled 2013-10-11: qty 2

## 2013-10-11 NOTE — ED Notes (Signed)
Discharge instructions given and reviewed with patient.  Patient verbalized understanding to complete all antibiotic and to follow up with Dr. Luan Pulling as needed.  Patient ambulatory; discharged home in good condition.

## 2013-10-11 NOTE — ED Notes (Signed)
20G IV d/c'd from left antecubital; site WNL and bleeding controlled.  Patient awaiting discharge.

## 2013-10-11 NOTE — ED Provider Notes (Signed)
CSN: [id number]     Arrival date & time 10/11/13  0059 History   First MD Initiated Contact with Patient 10/11/13 0324     Chief Complaint  Patient presents with  . Fever     (Consider location/radiation/quality/duration/timing/severity/associated sxs/prior Treatment) HPI This is a 68 year old female who is one-year status post urostomy for bladder cancer. She is here with several days of chills with fever to 103 yesterday evening. She had nausea and vomiting along with loose stools yesterday. She has not had much to it in the last 24 hours. She feels weak with general malaise. She denies abdominal pain. She denies chest pain or shortness of breath. She has had an occasional cough. She was given Zofran for nausea and acetaminophen for fever on arrival with improvement.  Past Medical History  Diagnosis Date  . Hypertension   . IBD (inflammatory bowel disease)   . Hyperlipidemia   . Diabetes mellitus   . Bladder tumor   . Heart murmur MILD-  ASYMPTOMATIC  . Urgency of urination   . Frequency of urination   . Hematuria   . DDD (degenerative disc disease) CERVICAL AND LUMBAR  . History of bladder cancer 12/25/2012  . Prolapse of vaginal vault after hysterectomy 12/25/2012   Past Surgical History  Procedure Laterality Date  . Bunionectomy  1980'S  . Hysteroscopy w/d&c  04/17/2012    Procedure: DILATATION AND CURETTAGE /HYSTEROSCOPY;  Surgeon: Jonnie Kind, MD;  Location: [facility];  Service: Gynecology;  Laterality: N/A;  POLYPECTOMY  . Tubal ligation  1984  . Breast biopsy  03-20-2011  [facility]    BENIGN LEFT BREAST MASS  . Transurethral resection of bladder tumor  05/29/2012    Procedure: TRANSURETHRAL RESECTION OF BLADDER TUMOR (TURBT);  Surgeon: Claybon Jabs, MD;  Location: [facility];  Service: Urology;  Laterality: N/A;  [place]  . Cystoscopy/retrograde/ureteroscopy  05/29/2012    Procedure: CYSTOSCOPY/RETROGRADE/URETEROSCOPY;  Surgeon: Claybon Jabs, MD;   Location: [facility];  Service: Urology;  Laterality: Right;   right  Retrograde Pyelogram   . Abdominal hysterectomy     Family History  Problem Relation Age of Onset  . Heart disease Mother   . Heart disease Father   . Cancer Sister     breast   . Diabetes Brother   . Cancer Brother     prostate   History  Substance Use Topics  . Smoking status: Former Smoker -- 10 years    Types: Cigarettes    Quit date: 05/26/1988  . Smokeless tobacco: Never Used  . Alcohol Use: No   OB History   Grav Para Term Preterm Abortions TAB SAB Ect Mult Living   3 3             Review of Systems  All other systems reviewed and are negative.   Allergies  Ceftin  Home Medications   Current Outpatient Rx  Name  Route  Sig  Dispense  Refill  . ALPRAZolam (XANAX) 1 MG tablet   Oral   Take 1 mg by mouth 3 (three) times daily as needed. For anxiety         . amLODipine (NORVASC) 10 MG tablet   Oral   Take 10 mg by mouth at bedtime.          Marland Kitchen aspirin EC 81 MG tablet      81 mg. Take 81 mg by mouth nightly.         Marland Kitchen  Calcium Carbonate-Vitamin D (CALCIUM 500 + D PO)   Oral   Take 1 tablet by mouth 3 (three) times daily.          . Coenzyme Q10 (COQ10) 100 MG CAPS   Oral   Take 1 capsule by mouth daily.         Marland Kitchen HYDROcodone-acetaminophen (NORCO) 10-325 MG per tablet   Oral   Take 1 tablet by mouth every 6 (six) hours as needed for pain.   30 tablet   0   . HYDROcodone-acetaminophen (VICODIN) 5-500 MG per tablet   Oral   Take 1 tablet by mouth every 6 (six) hours as needed for pain.   30 tablet   0   . ibuprofen (ADVIL,MOTRIN) 600 MG tablet   Oral   Take 1 tablet (600 mg total) by mouth every 6 (six) hours as needed for pain.   30 tablet   1   . loratadine (CLARITIN) 10 MG tablet   Oral   Take 10 mg by mouth as needed.          . Mesalamine (ASACOL HD) 800 MG TBEC   Oral   Take 1 tablet by mouth 2 (two) times daily.         .  metFORMIN (GLUCOPHAGE-XR) 500 MG 24 hr tablet   Oral   Take 500 mg by mouth every evening.          . Multiple Vitamins-Minerals (CENTRUM SILVER PO)   Oral   Take 1 tablet by mouth daily.          . Multiple Vitamins-Minerals (MULTIVITAMIN PO)      1 tablet. Take 1 tablet by mouth daily.         . nitrofurantoin, macrocrystal-monohydrate, (MACROBID) 100 MG capsule   Oral   Take 1 capsule (100 mg total) by mouth 1 day or 1 dose.   14 capsule   0   . phenazopyridine (PYRIDIUM) 200 MG tablet   Oral   Take 1 tablet (200 mg total) by mouth 3 (three) times daily as needed for pain.   30 tablet   0   . potassium chloride SA (K-DUR,KLOR-CON) 20 MEQ tablet      20 mEq. Take 20 mEq by mouth every morning.         . pravastatin (PRAVACHOL) 40 MG tablet   Oral   Take 40 mg by mouth every morning.          . ramipril (ALTACE) 10 MG capsule   Oral   Take 10 mg by mouth every morning.          . sennosides-docusate sodium (SENOKOT-S) 8.6-50 MG tablet      2 tablets. Take 2 tablets by mouth daily.         Marland Kitchen tolterodine (DETROL LA) 4 MG 24 hr capsule   Oral   Take 1 capsule (4 mg total) by mouth daily.   30 capsule   0    BP 148/67  Pulse 69  Temp(Src) 99 F (37.2 C) (Oral)  Resp 18  Ht 5' 4.5" (1.638 m)  Wt 158 lb (71.668 kg)  BMI 26.71 kg/m2  SpO2 98%  Physical Exam General: Well-developed, well-nourished female in no acute distress; appearance consistent with age of record HENT: normocephalic; atraumatic Eyes: pupils equal, round and reactive to light; extraocular muscles intact Neck: supple Heart: regular rate and rhythm Lungs: clear to auscultation bilaterally Abdomen: soft; nondistended; nontender; urostomy right lower quadrant draining  cloudy yellow urine; no masses or hepatosplenomegaly; bowel sounds present Extremities: No deformity; full range of motion; pulses normal Neurologic: Awake, alert and oriented; motor function intact in all  extremities and symmetric; no facial droop Skin: Warm and dry Psychiatric: Flat affect    ED Course  Procedures (including critical care time)   MDM   Nursing notes and vitals signs, including pulse oximetry, reviewed.  Summary of this visit's results, reviewed by myself:  Labs:  Results for orders placed during the hospital encounter of 10/11/13 (from the past 24 hour(s))  CBC WITH DIFFERENTIAL     Status: Abnormal   Collection Time    10/11/13  1:23 AM      Result Value Ref Range   WBC 14.3 (*) 4.0 - 10.5 K/uL   RBC 4.10  3.87 - 5.11 MIL/uL   Hemoglobin 12.0  12.0 - 15.0 g/dL   HCT 36.1  36.0 - 46.0 %   MCV 88.0  78.0 - 100.0 fL   MCH 29.3  26.0 - 34.0 pg   MCHC 33.2  30.0 - 36.0 g/dL   RDW 14.0  11.5 - 15.5 %   Platelets 439 (*) 150 - 400 K/uL   Neutrophils Relative % 88 (*) 43 - 77 %   Neutro Abs 12.6 (*) 1.7 - 7.7 K/uL   Lymphocytes Relative 4 (*) 12 - 46 %   Lymphs Abs 0.5 (*) 0.7 - 4.0 K/uL   Monocytes Relative 8  3 - 12 %   Monocytes Absolute 1.2 (*) 0.1 - 1.0 K/uL   Eosinophils Relative 0  0 - 5 %   Eosinophils Absolute 0.0  0.0 - 0.7 K/uL   Basophils Relative 0  0 - 1 %   Basophils Absolute 0.0  0.0 - 0.1 K/uL  BASIC METABOLIC PANEL     Status: Abnormal   Collection Time    10/11/13  1:23 AM      Result Value Ref Range   Sodium 139  137 - 147 mEq/L   Potassium 3.8  3.7 - 5.3 mEq/L   Chloride 95 (*) 96 - 112 mEq/L   CO2 25  19 - 32 mEq/L   Glucose, Bld 200 (*) 70 - 99 mg/dL   BUN 11  6 - 23 mg/dL   Creatinine, Ser 0.70  0.50 - 1.10 mg/dL   Calcium 9.8  8.4 - 10.5 mg/dL   GFR calc non Af Amer 86 (*) >90 mL/min   GFR calc Af Amer >90  >90 mL/min  CBG MONITORING, ED     Status: Abnormal   Collection Time    10/11/13  1:43 AM      Result Value Ref Range   Glucose-Capillary 189 (*) 70 - 99 mg/dL   Comment 1 Documented in Chart     Comment 2 Notify RN    URINALYSIS, ROUTINE W REFLEX MICROSCOPIC     Status: Abnormal   Collection Time    10/11/13  2:22  AM      Result Value Ref Range   Color, Urine YELLOW  YELLOW   APPearance CLEAR  CLEAR   Specific Gravity, Urine 1.015  1.005 - 1.030   pH 6.0  5.0 - 8.0   Glucose, UA NEGATIVE  NEGATIVE mg/dL   Hgb urine dipstick TRACE (*) NEGATIVE   Bilirubin Urine NEGATIVE  NEGATIVE   Ketones, ur 40 (*) NEGATIVE mg/dL   Protein, ur 30 (*) NEGATIVE mg/dL   Urobilinogen, UA 0.2  0.0 - 1.0  mg/dL   Nitrite POSITIVE (*) NEGATIVE   Leukocytes, UA SMALL (*) NEGATIVE  URINE MICROSCOPIC-ADD ON     Status: Abnormal   Collection Time    10/11/13  2:22 AM      Result Value Ref Range   Squamous Epithelial / LPF RARE  RARE   WBC, UA 11-20  <3 WBC/hpf   RBC / HPF 0-2  <3 RBC/hpf   Bacteria, UA MANY (*) RARE   5:35 AM Feels better after IV hydration. Drinking fluids without emesis. We'll treat for UTI.     Wynetta Fines, MD 10/11/13 [phone]

## 2013-10-11 NOTE — ED Notes (Signed)
Pt c/o chills, N/V starting Sunday.

## 2013-10-12 DIAGNOSIS — R112 Nausea with vomiting, unspecified: Secondary | ICD-10-CM | POA: Diagnosis not present

## 2013-10-12 DIAGNOSIS — I10 Essential (primary) hypertension: Secondary | ICD-10-CM | POA: Diagnosis not present

## 2013-10-12 DIAGNOSIS — N390 Urinary tract infection, site not specified: Secondary | ICD-10-CM | POA: Diagnosis not present

## 2013-10-12 DIAGNOSIS — E785 Hyperlipidemia, unspecified: Secondary | ICD-10-CM | POA: Diagnosis not present

## 2013-11-15 ENCOUNTER — Emergency Department (HOSPITAL_COMMUNITY)
Admission: EM | Admit: 2013-11-15 | Discharge: 2013-11-15 | Disposition: A | Discharge status: Home / Self Care | Payer: Medicare Other | Attending: Emergency Medicine | Admitting: Emergency Medicine

## 2013-11-15 ENCOUNTER — Encounter (HOSPITAL_COMMUNITY): Payer: Self-pay | Admitting: Emergency Medicine

## 2013-11-15 ENCOUNTER — Emergency Department (HOSPITAL_COMMUNITY): Payer: Medicare Other

## 2013-11-15 DIAGNOSIS — Z87448 Personal history of other diseases of urinary system: Secondary | ICD-10-CM | POA: Insufficient documentation

## 2013-11-15 DIAGNOSIS — K5289 Other specified noninfective gastroenteritis and colitis: Secondary | ICD-10-CM | POA: Insufficient documentation

## 2013-11-15 DIAGNOSIS — IMO0002 Reserved for concepts with insufficient information to code with codable children: Secondary | ICD-10-CM | POA: Diagnosis not present

## 2013-11-15 DIAGNOSIS — I10 Essential (primary) hypertension: Secondary | ICD-10-CM | POA: Diagnosis not present

## 2013-11-15 DIAGNOSIS — R05 Cough: Secondary | ICD-10-CM | POA: Insufficient documentation

## 2013-11-15 DIAGNOSIS — E119 Type 2 diabetes mellitus without complications: Secondary | ICD-10-CM | POA: Diagnosis not present

## 2013-11-15 DIAGNOSIS — Z8551 Personal history of malignant neoplasm of bladder: Secondary | ICD-10-CM | POA: Insufficient documentation

## 2013-11-15 DIAGNOSIS — Z87891 Personal history of nicotine dependence: Secondary | ICD-10-CM | POA: Diagnosis not present

## 2013-11-15 DIAGNOSIS — I4949 Other premature depolarization: Secondary | ICD-10-CM | POA: Diagnosis not present

## 2013-11-15 DIAGNOSIS — Z7982 Long term (current) use of aspirin: Secondary | ICD-10-CM | POA: Diagnosis not present

## 2013-11-15 DIAGNOSIS — R059 Cough, unspecified: Secondary | ICD-10-CM | POA: Diagnosis not present

## 2013-11-15 DIAGNOSIS — Z87440 Personal history of urinary (tract) infections: Secondary | ICD-10-CM | POA: Diagnosis not present

## 2013-11-15 DIAGNOSIS — Z79899 Other long term (current) drug therapy: Secondary | ICD-10-CM | POA: Diagnosis not present

## 2013-11-15 DIAGNOSIS — E785 Hyperlipidemia, unspecified: Secondary | ICD-10-CM | POA: Insufficient documentation

## 2013-11-15 DIAGNOSIS — Z8742 Personal history of other diseases of the female genital tract: Secondary | ICD-10-CM | POA: Insufficient documentation

## 2013-11-15 DIAGNOSIS — R079 Chest pain, unspecified: Secondary | ICD-10-CM | POA: Diagnosis not present

## 2013-11-15 DIAGNOSIS — R0602 Shortness of breath: Secondary | ICD-10-CM | POA: Diagnosis not present

## 2013-11-15 DIAGNOSIS — R011 Cardiac murmur, unspecified: Secondary | ICD-10-CM | POA: Diagnosis not present

## 2013-11-15 DIAGNOSIS — Z87898 Personal history of other specified conditions: Secondary | ICD-10-CM

## 2013-11-15 DIAGNOSIS — J449 Chronic obstructive pulmonary disease, unspecified: Secondary | ICD-10-CM | POA: Diagnosis not present

## 2013-11-15 LAB — CBC WITH DIFFERENTIAL/PLATELET
Basophils Absolute: 0.1 10*3/uL (ref 0.0–0.1)
Basophils Relative: 1 % (ref 0–1)
EOS ABS: 0.1 10*3/uL (ref 0.0–0.7)
EOS PCT: 1 % (ref 0–5)
HCT: 35.2 % — ABNORMAL LOW (ref 36.0–46.0)
Hemoglobin: 11.7 g/dL — ABNORMAL LOW (ref 12.0–15.0)
LYMPHS ABS: 1.9 10*3/uL (ref 0.7–4.0)
LYMPHS PCT: 21 % (ref 12–46)
MCH: 29.4 pg (ref 26.0–34.0)
MCHC: 33.2 g/dL (ref 30.0–36.0)
MCV: 88.4 fL (ref 78.0–100.0)
MONOS PCT: 10 % (ref 3–12)
Monocytes Absolute: 0.9 10*3/uL (ref 0.1–1.0)
Neutro Abs: 6.2 10*3/uL (ref 1.7–7.7)
Neutrophils Relative %: 67 % (ref 43–77)
PLATELETS: 395 10*3/uL (ref 150–400)
RBC: 3.98 MIL/uL (ref 3.87–5.11)
RDW: 14.5 % (ref 11.5–15.5)
WBC: 9.1 10*3/uL (ref 4.0–10.5)

## 2013-11-15 LAB — BASIC METABOLIC PANEL
BUN: 10 mg/dL (ref 6–23)
CHLORIDE: 100 meq/L (ref 96–112)
CO2: 29 mEq/L (ref 19–32)
Calcium: 9.5 mg/dL (ref 8.4–10.5)
Creatinine, Ser: 0.64 mg/dL (ref 0.50–1.10)
GFR calc Af Amer: >90 mL/min (ref >90)
GFR calc non Af Amer: 89 mL/min — ABNORMAL LOW (ref >90)
Glucose, Bld: 140 mg/dL — ABNORMAL HIGH (ref 70–99)
Potassium: 4 mEq/L (ref 3.7–5.3)
SODIUM: 141 meq/L (ref 137–147)

## 2013-11-15 LAB — TROPONIN I

## 2013-11-15 MED ORDER — METOPROLOL SUCCINATE ER 25 MG PO TB24: 12.5000 mg | ORAL_TABLET | Freq: Every day | ORAL | Status: DC

## 2013-11-15 NOTE — ED Notes (Signed)
In to discharge pt. PA in with pt. Pt states is having palpations again. Repeat EKG being done

## 2013-11-15 NOTE — ED Notes (Addendum)
Pt c/o intermittent tachycardia and sob since Friday worse with exertion. Pt also c/o np cough.

## 2013-11-15 NOTE — ED Provider Notes (Signed)
CSN: [id number]     Arrival date & time 11/15/13  1005 History  This chart was scribed for non-physician practitioner working with Evalee Jefferson, by Allena Earing ED Scribe. This patient was seen in [facility] and the patient's care was started at 10:44 AM.    Chief Complaint  Patient presents with  . Shortness of Breath      The history is provided by the patient and a relative. No language interpreter was used.   HPI Comments: April Gomez is a 68 y.o. female with h/o HTN who presents to the Emergency Department complaining of intermitteint episodes of  "fluttering" in her heart that began this past Friday shortly after drinking coffee and has been having intermittent episodes lasting a "minute" throughout the day. It was present upon first arrival here,  But is currently resolved.   She had been taking lasix for it but had stopped, she started to take them again this weekend to no effect. This weekend, a cough developed and she states that its worsened when she drinks any liquids. The cough is non productive and she states that its initiated by the fluttering  that takes her breath away, "feels like something is choking her". She reports brief episodes of SOB that is worsened by her cough. She denies sore throat, nausea, and vomiting.  She can hear the palpitations in her head, "she turned and thought it was a bee".  Pt had UTI 3 weeks ago.   Past Medical History  Diagnosis Date  . Hypertension   . IBD (inflammatory bowel disease)   . Hyperlipidemia   . Diabetes mellitus   . Bladder tumor   . Heart murmur MILD-  ASYMPTOMATIC  . Urgency of urination   . Frequency of urination   . Hematuria   . DDD (degenerative disc disease) CERVICAL AND LUMBAR  . History of bladder cancer 12/25/2012  . Prolapse of vaginal vault after hysterectomy 12/25/2012   Past Surgical History  Procedure Laterality Date  . Bunionectomy  1980'S  . Hysteroscopy w/d&c  04/17/2012    Procedure: DILATATION  AND CURETTAGE /HYSTEROSCOPY;  Surgeon: Jonnie Kind, MD;  Location: [facility];  Service: Gynecology;  Laterality: N/A;  POLYPECTOMY  . Tubal ligation  1984  . Breast biopsy  03-20-2011  DR [facility]    BENIGN LEFT BREAST MASS  . Transurethral resection of bladder tumor  05/29/2012    Procedure: TRANSURETHRAL RESECTION OF BLADDER TUMOR (TURBT);  Surgeon: Claybon Jabs, MD;  Location[facility];  Service: Urology;  Laterality: N/A;  [facility]  . Cystoscopy/retrograde/ureteroscopy  05/29/2012    Procedure: CYSTOSCOPY/RETROGRADE/URETEROSCOPY;  Surgeon: Claybon Jabs, MD;  Location: [facility];  Service: Urology;  Laterality: Right;   right  Retrograde Pyelogram   . Abdominal hysterectomy     Family History  Problem Relation Age of Onset  . Heart disease Mother   . Heart disease Father   . Cancer Sister     breast   . Diabetes Brother   . Cancer Brother     prostate   History  Substance Use Topics  . Smoking status: Former Smoker -- 10 years    Types: Cigarettes    Quit date: 05/26/1988  . Smokeless tobacco: Never Used  . Alcohol Use: No   OB History   Grav Para Term Preterm Abortions TAB SAB Ect Mult Living   3 3             Review of  Systems  Constitutional: Negative for fever.  HENT: Negative for congestion and sore throat.   Eyes: Negative.   Respiratory: Positive for cough and shortness of breath. Negative for chest tightness.   Cardiovascular: Positive for chest pain ( "fluttering").  Gastrointestinal: Negative for nausea and abdominal pain.  Genitourinary: Negative.   Musculoskeletal: Negative for arthralgias, joint swelling and neck pain.  Skin: Negative.  Negative for rash and wound.  Neurological: Negative for dizziness, weakness, light-headedness, numbness and headaches.  Psychiatric/Behavioral: Negative.       Allergies  Ceftin  Home Medications   Prior to Admission medications   Medication Sig Start Date End Date Taking?  Authorizing Provider  amLODipine (NORVASC) 10 MG tablet Take 10 mg by mouth at bedtime.    Yes Historical Provider, MD  aspirin EC 81 MG tablet 81 mg. Take 81 mg by mouth nightly.   Yes Historical Provider, MD  loratadine (CLARITIN) 10 MG tablet Take 10 mg by mouth as needed.    Yes Historical Provider, MD  Mesalamine (ASACOL HD) 800 MG TBEC Take 1 tablet by mouth 2 (two) times daily.   Yes Historical Provider, MD  metFORMIN (GLUCOPHAGE-XR) 500 MG 24 hr tablet Take 500 mg by mouth every evening.    Yes Historical Provider, MD  Multiple Vitamins-Minerals (CENTRUM SILVER PO) Take 1 tablet by mouth daily.    Yes Historical Provider, MD  pantoprazole (PROTONIX) 20 MG tablet Take 20 mg by mouth daily.   Yes Historical Provider, MD  potassium chloride SA (K-DUR,KLOR-CON) 20 MEQ tablet 20 mEq. Take 20 mEq by mouth every morning.   Yes Historical Provider, MD  pravastatin (PRAVACHOL) 40 MG tablet Take 40 mg by mouth every morning.    Yes Historical Provider, MD  ramipril (ALTACE) 10 MG capsule Take 10 mg by mouth every morning.    Yes Historical Provider, MD   BP 159/75  Pulse 93  Temp(Src) 99.5 F (37.5 C)  Resp 20  Ht 5\' 5"  (1.651 m)  Wt 160 lb (72.576 kg)  BMI 26.63 kg/m2  SpO2 98% Physical Exam  Nursing note and vitals reviewed. Constitutional: She appears well-developed and well-nourished.  HENT:  Head: Normocephalic and atraumatic.  Eyes: Conjunctivae are normal.  Neck: Normal range of motion.  Cardiovascular: Normal rate, regular rhythm and intact distal pulses.   Murmur heard.  Systolic murmur is present with a grade of 2/6  Pulses:      Dorsalis pedis pulses are 2+ on the right side, and 2+ on the left side.  No ankle edema No carotid bruits  Pulmonary/Chest: Effort normal and breath sounds normal. She has no wheezes.  Abdominal: Soft. Bowel sounds are normal. There is no tenderness.  Musculoskeletal: Normal range of motion.  Neurological: She is alert.  Skin: Skin is warm and  dry.  Psychiatric: She has a normal mood and affect.    ED Course  Procedures (including critical care time)  DIAGNOSTIC STUDIES: Oxygen Saturation is 98% on RA, normal by my interpretation.    COORDINATION OF CARE:  10:54 AM-Discussed treatment plan which includes labs, CXR, with pt at bedside and pt agreed to plan.      Labs Review Labs Reviewed  BASIC METABOLIC PANEL - Abnormal; Notable for the following:    Glucose, Bld 140 (*)    GFR calc non Af Amer 89 (*)    All other components within normal limits  CBC WITH DIFFERENTIAL - Abnormal; Notable for the following:    Hemoglobin 11.7 (*)  HCT 35.2 (*)    All other components within normal limits  TROPONIN I    Imaging Review Dg Chest 2 View  11/15/2013   CLINICAL DATA:  Shortness of breath, tachycardia, cough, history hypertension, hyperlipidemia, diabetes, bladder cancer  EXAM: CHEST  2 VIEW  COMPARISON:  CT chest 06/10/2012  FINDINGS: Upper normal heart size.  Mediastinal contours and pulmonary vascularity normal.  Emphysematous changes consistent with COPD.  No acute infiltrate, pleural effusion or pneumothorax.  No acute osseous findings.  IMPRESSION: COPD changes.  No acute abnormalities.   Electronically Signed   By: Lavonia Dana M.D.   On: 11/15/2013 10:45     EKG Interpretation None          Date: 11/15/2013  This is the ekg at time of dc,  When pt had complaint of return of the palpitation.  Rate: 85  Rhythm: normal sinus rhythm  QRS Axis: normal  Intervals: normal  ST/T Wave abnormalities: normal and nonspecific T wave changes  Conduction Disutrbances:occasional pvc  Narrative Interpretation:   Old EKG Reviewed: changes noted    MDM   Final diagnoses:  History of palpitations   I personally performed the services described in this documentation, which was scribed in my presence. The recorded information has been reviewed and is accurate.   Pt was observed on monitor during ed visit and was  asymptomatic.  As soon as she was dressed and ready for dc,  Sx returned.  Repeat of ekg revealing for PVC.  She was given a low dose metoprolol 12.5 mg long acting for use for the next 2-3 days,  Then daily only if sx return.  She was referred to [facility] heartcare for further evaluation.  Reassurance given that this does appear to be a serious problem today but she should get followup care, may consider holter monitor or recommendations per cardiology.  Advised to avoid coffee, caffeine, chocolate, etc.  Pt and husband understand and agree with plan.    Evalee Jefferson, PA-C 11/15/13 1712

## 2013-11-17 DIAGNOSIS — I10 Essential (primary) hypertension: Secondary | ICD-10-CM | POA: Diagnosis not present

## 2013-11-17 DIAGNOSIS — C679 Malignant neoplasm of bladder, unspecified: Secondary | ICD-10-CM | POA: Diagnosis not present

## 2013-11-17 DIAGNOSIS — E109 Type 1 diabetes mellitus without complications: Secondary | ICD-10-CM | POA: Diagnosis not present

## 2013-11-17 DIAGNOSIS — R002 Palpitations: Secondary | ICD-10-CM | POA: Diagnosis not present

## 2013-11-18 NOTE — ED Provider Notes (Signed)
Medical screening examination/treatment/procedure(s) were performed by non-physician practitioner and as supervising physician I was immediately available for consultation/collaboration.   EKG Interpretation   Date/Time:  Monday November 15 2013 13:38:10 EDT Ventricular Rate:  85 PR Interval:  162 QRS Duration: 86 QT Interval:  368 QTC Calculation: 437 R Axis:   11 Text Interpretation:  Sinus rhythm with occasional Premature ventricular  complexes Moderate voltage criteria for LVH, may be normal variant  Inferior infarct , age undetermined Abnormal ECG When compared with ECG of  15-Nov-2013 13:35, Premature ventricular complexes are now Present ED  PHYSICIAN INTERPRETATION AVAILABLE IN [place] Confirmed by TEST,  Record ([id number]) on 11/17/2013 6:59:25 AM        Maudry Diego, MD 11/18/13 [phone]

## 2014-01-12 DIAGNOSIS — I10 Essential (primary) hypertension: Secondary | ICD-10-CM | POA: Diagnosis not present

## 2014-01-12 DIAGNOSIS — E785 Hyperlipidemia, unspecified: Secondary | ICD-10-CM | POA: Diagnosis not present

## 2014-01-12 DIAGNOSIS — C679 Malignant neoplasm of bladder, unspecified: Secondary | ICD-10-CM | POA: Diagnosis not present

## 2014-01-12 DIAGNOSIS — E109 Type 1 diabetes mellitus without complications: Secondary | ICD-10-CM | POA: Diagnosis not present

## 2014-01-17 ENCOUNTER — Other Ambulatory Visit (HOSPITAL_COMMUNITY): Payer: Self-pay | Admitting: Pulmonary Disease

## 2014-01-17 DIAGNOSIS — Z1231 Encounter for screening mammogram for malignant neoplasm of breast: Secondary | ICD-10-CM

## 2014-01-20 ENCOUNTER — Ambulatory Visit (HOSPITAL_COMMUNITY)
Admission: RE | Admit: 2014-01-20 | Discharge: 2014-01-20 | Disposition: A | Discharge status: Home / Self Care | Payer: Medicare Other | Source: Ambulatory Visit | Attending: Pulmonary Disease | Admitting: Pulmonary Disease

## 2014-01-20 DIAGNOSIS — Z1231 Encounter for screening mammogram for malignant neoplasm of breast: Secondary | ICD-10-CM | POA: Diagnosis not present

## 2014-02-10 DIAGNOSIS — Z8551 Personal history of malignant neoplasm of bladder: Secondary | ICD-10-CM | POA: Diagnosis not present

## 2014-03-09 DIAGNOSIS — Z8551 Personal history of malignant neoplasm of bladder: Secondary | ICD-10-CM | POA: Diagnosis not present

## 2014-03-09 DIAGNOSIS — N8110 Cystocele, unspecified: Secondary | ICD-10-CM | POA: Diagnosis not present

## 2014-03-09 DIAGNOSIS — N993 Prolapse of vaginal vault after hysterectomy: Secondary | ICD-10-CM | POA: Diagnosis not present

## 2014-03-22 DIAGNOSIS — C679 Malignant neoplasm of bladder, unspecified: Secondary | ICD-10-CM | POA: Diagnosis not present

## 2014-03-31 DIAGNOSIS — K7689 Other specified diseases of liver: Secondary | ICD-10-CM | POA: Diagnosis not present

## 2014-03-31 DIAGNOSIS — Z8551 Personal history of malignant neoplasm of bladder: Secondary | ICD-10-CM | POA: Diagnosis not present

## 2014-03-31 DIAGNOSIS — J438 Other emphysema: Secondary | ICD-10-CM | POA: Diagnosis not present

## 2014-03-31 DIAGNOSIS — N134 Hydroureter: Secondary | ICD-10-CM | POA: Diagnosis not present

## 2014-03-31 DIAGNOSIS — C679 Malignant neoplasm of bladder, unspecified: Secondary | ICD-10-CM | POA: Diagnosis not present

## 2014-03-31 DIAGNOSIS — N1330 Unspecified hydronephrosis: Secondary | ICD-10-CM | POA: Diagnosis not present

## 2014-04-25 DIAGNOSIS — K2100 Gastro-esophageal reflux disease with esophagitis, without bleeding: Secondary | ICD-10-CM | POA: Diagnosis not present

## 2014-04-25 DIAGNOSIS — C679 Malignant neoplasm of bladder, unspecified: Secondary | ICD-10-CM | POA: Diagnosis not present

## 2014-04-25 DIAGNOSIS — J209 Acute bronchitis, unspecified: Secondary | ICD-10-CM | POA: Diagnosis not present

## 2014-04-25 DIAGNOSIS — I2109 ST elevation (STEMI) myocardial infarction involving other coronary artery of anterior wall: Secondary | ICD-10-CM | POA: Diagnosis not present

## 2014-05-30 ENCOUNTER — Encounter (HOSPITAL_COMMUNITY): Payer: Self-pay | Admitting: Emergency Medicine

## 2014-07-25 DIAGNOSIS — E119 Type 2 diabetes mellitus without complications: Secondary | ICD-10-CM | POA: Diagnosis not present

## 2014-07-25 DIAGNOSIS — I10 Essential (primary) hypertension: Secondary | ICD-10-CM | POA: Diagnosis not present

## 2014-07-25 DIAGNOSIS — C679 Malignant neoplasm of bladder, unspecified: Secondary | ICD-10-CM | POA: Diagnosis not present

## 2014-07-25 DIAGNOSIS — D649 Anemia, unspecified: Secondary | ICD-10-CM | POA: Diagnosis not present

## 2014-07-26 DIAGNOSIS — I10 Essential (primary) hypertension: Secondary | ICD-10-CM | POA: Diagnosis not present

## 2014-07-26 DIAGNOSIS — D649 Anemia, unspecified: Secondary | ICD-10-CM | POA: Diagnosis not present

## 2014-07-26 DIAGNOSIS — C679 Malignant neoplasm of bladder, unspecified: Secondary | ICD-10-CM | POA: Diagnosis not present

## 2014-07-26 DIAGNOSIS — E119 Type 2 diabetes mellitus without complications: Secondary | ICD-10-CM | POA: Diagnosis not present

## 2014-10-25 DIAGNOSIS — J209 Acute bronchitis, unspecified: Secondary | ICD-10-CM | POA: Diagnosis not present

## 2014-10-25 DIAGNOSIS — C679 Malignant neoplasm of bladder, unspecified: Secondary | ICD-10-CM | POA: Diagnosis not present

## 2014-10-25 DIAGNOSIS — I10 Essential (primary) hypertension: Secondary | ICD-10-CM | POA: Diagnosis not present

## 2015-01-20 ENCOUNTER — Other Ambulatory Visit (HOSPITAL_COMMUNITY): Payer: Self-pay | Admitting: Pulmonary Disease

## 2015-01-20 DIAGNOSIS — Z1231 Encounter for screening mammogram for malignant neoplasm of breast: Secondary | ICD-10-CM

## 2015-01-24 DIAGNOSIS — C679 Malignant neoplasm of bladder, unspecified: Secondary | ICD-10-CM | POA: Diagnosis not present

## 2015-01-24 DIAGNOSIS — J209 Acute bronchitis, unspecified: Secondary | ICD-10-CM | POA: Diagnosis not present

## 2015-01-24 DIAGNOSIS — I10 Essential (primary) hypertension: Secondary | ICD-10-CM | POA: Diagnosis not present

## 2015-01-25 DIAGNOSIS — D649 Anemia, unspecified: Secondary | ICD-10-CM | POA: Diagnosis not present

## 2015-01-25 DIAGNOSIS — C679 Malignant neoplasm of bladder, unspecified: Secondary | ICD-10-CM | POA: Diagnosis not present

## 2015-01-25 DIAGNOSIS — I10 Essential (primary) hypertension: Secondary | ICD-10-CM | POA: Diagnosis not present

## 2015-01-25 DIAGNOSIS — E119 Type 2 diabetes mellitus without complications: Secondary | ICD-10-CM | POA: Diagnosis not present

## 2015-01-26 ENCOUNTER — Ambulatory Visit (HOSPITAL_COMMUNITY)
Admission: RE | Admit: 2015-01-26 | Discharge: 2015-01-26 | Disposition: A | Discharge status: Home / Self Care | Payer: Medicare Other | Source: Ambulatory Visit | Attending: Pulmonary Disease | Admitting: Pulmonary Disease

## 2015-01-26 DIAGNOSIS — Z1231 Encounter for screening mammogram for malignant neoplasm of breast: Secondary | ICD-10-CM | POA: Insufficient documentation

## 2015-03-24 DIAGNOSIS — C679 Malignant neoplasm of bladder, unspecified: Secondary | ICD-10-CM | POA: Diagnosis not present

## 2015-04-04 DIAGNOSIS — N8110 Cystocele, unspecified: Secondary | ICD-10-CM | POA: Diagnosis not present

## 2015-04-04 DIAGNOSIS — Z87891 Personal history of nicotine dependence: Secondary | ICD-10-CM | POA: Diagnosis not present

## 2015-04-04 DIAGNOSIS — Z08 Encounter for follow-up examination after completed treatment for malignant neoplasm: Secondary | ICD-10-CM | POA: Diagnosis not present

## 2015-04-04 DIAGNOSIS — Z8551 Personal history of malignant neoplasm of bladder: Secondary | ICD-10-CM | POA: Diagnosis not present

## 2015-04-04 DIAGNOSIS — Z936 Other artificial openings of urinary tract status: Secondary | ICD-10-CM | POA: Diagnosis not present

## 2015-04-04 DIAGNOSIS — N1339 Other hydronephrosis: Secondary | ICD-10-CM | POA: Diagnosis not present

## 2015-04-04 DIAGNOSIS — C679 Malignant neoplasm of bladder, unspecified: Secondary | ICD-10-CM | POA: Diagnosis not present

## 2015-04-04 DIAGNOSIS — Z483 Aftercare following surgery for neoplasm: Secondary | ICD-10-CM | POA: Diagnosis not present

## 2015-04-04 DIAGNOSIS — N2889 Other specified disorders of kidney and ureter: Secondary | ICD-10-CM | POA: Diagnosis not present

## 2015-04-04 DIAGNOSIS — C678 Malignant neoplasm of overlapping sites of bladder: Secondary | ICD-10-CM | POA: Diagnosis not present

## 2015-04-04 DIAGNOSIS — N819 Female genital prolapse, unspecified: Secondary | ICD-10-CM | POA: Diagnosis not present

## 2015-04-04 DIAGNOSIS — K435 Parastomal hernia without obstruction or  gangrene: Secondary | ICD-10-CM | POA: Diagnosis not present

## 2015-04-25 DIAGNOSIS — C679 Malignant neoplasm of bladder, unspecified: Secondary | ICD-10-CM | POA: Diagnosis not present

## 2015-04-25 DIAGNOSIS — D649 Anemia, unspecified: Secondary | ICD-10-CM | POA: Diagnosis not present

## 2015-04-25 DIAGNOSIS — E119 Type 2 diabetes mellitus without complications: Secondary | ICD-10-CM | POA: Diagnosis not present

## 2015-04-25 DIAGNOSIS — I10 Essential (primary) hypertension: Secondary | ICD-10-CM | POA: Diagnosis not present

## 2015-04-27 DIAGNOSIS — E119 Type 2 diabetes mellitus without complications: Secondary | ICD-10-CM | POA: Diagnosis not present

## 2015-04-27 DIAGNOSIS — J201 Acute bronchitis due to Hemophilus influenzae: Secondary | ICD-10-CM | POA: Diagnosis not present

## 2015-04-27 DIAGNOSIS — K219 Gastro-esophageal reflux disease without esophagitis: Secondary | ICD-10-CM | POA: Diagnosis not present

## 2015-04-27 DIAGNOSIS — D649 Anemia, unspecified: Secondary | ICD-10-CM | POA: Diagnosis not present

## 2015-05-02 DIAGNOSIS — T8131XA Disruption of external operation (surgical) wound, not elsewhere classified, initial encounter: Secondary | ICD-10-CM | POA: Diagnosis not present

## 2015-05-02 DIAGNOSIS — I2510 Atherosclerotic heart disease of native coronary artery without angina pectoris: Secondary | ICD-10-CM | POA: Diagnosis present

## 2015-05-02 DIAGNOSIS — Z8551 Personal history of malignant neoplasm of bladder: Secondary | ICD-10-CM | POA: Diagnosis not present

## 2015-05-02 DIAGNOSIS — Z7952 Long term (current) use of systemic steroids: Secondary | ICD-10-CM | POA: Diagnosis not present

## 2015-05-02 DIAGNOSIS — F329 Major depressive disorder, single episode, unspecified: Secondary | ICD-10-CM | POA: Diagnosis present

## 2015-05-02 DIAGNOSIS — Z906 Acquired absence of other parts of urinary tract: Secondary | ICD-10-CM | POA: Diagnosis not present

## 2015-05-02 DIAGNOSIS — N8110 Cystocele, unspecified: Secondary | ICD-10-CM | POA: Diagnosis present

## 2015-05-02 DIAGNOSIS — Z87442 Personal history of urinary calculi: Secondary | ICD-10-CM | POA: Diagnosis not present

## 2015-05-02 DIAGNOSIS — E119 Type 2 diabetes mellitus without complications: Secondary | ICD-10-CM | POA: Diagnosis present

## 2015-05-02 DIAGNOSIS — D62 Acute posthemorrhagic anemia: Secondary | ICD-10-CM | POA: Diagnosis present

## 2015-05-02 DIAGNOSIS — K460 Unspecified abdominal hernia with obstruction, without gangrene: Secondary | ICD-10-CM | POA: Diagnosis present

## 2015-05-02 DIAGNOSIS — Z7982 Long term (current) use of aspirin: Secondary | ICD-10-CM | POA: Diagnosis not present

## 2015-05-02 DIAGNOSIS — Z90710 Acquired absence of both cervix and uterus: Secondary | ICD-10-CM | POA: Diagnosis not present

## 2015-05-02 DIAGNOSIS — Z87891 Personal history of nicotine dependence: Secondary | ICD-10-CM | POA: Diagnosis not present

## 2015-05-02 DIAGNOSIS — I10 Essential (primary) hypertension: Secondary | ICD-10-CM | POA: Diagnosis present

## 2015-05-02 DIAGNOSIS — N736 Female pelvic peritoneal adhesions (postinfective): Secondary | ICD-10-CM | POA: Diagnosis not present

## 2015-05-02 DIAGNOSIS — K660 Peritoneal adhesions (postprocedural) (postinfection): Secondary | ICD-10-CM | POA: Diagnosis present

## 2015-05-02 DIAGNOSIS — N993 Prolapse of vaginal vault after hysterectomy: Secondary | ICD-10-CM | POA: Diagnosis not present

## 2015-05-02 DIAGNOSIS — Z79899 Other long term (current) drug therapy: Secondary | ICD-10-CM | POA: Diagnosis not present

## 2015-05-02 DIAGNOSIS — Z9851 Tubal ligation status: Secondary | ICD-10-CM | POA: Diagnosis not present

## 2015-05-02 DIAGNOSIS — Z881 Allergy status to other antibiotic agents status: Secondary | ICD-10-CM | POA: Diagnosis not present

## 2015-05-02 DIAGNOSIS — R6889 Other general symptoms and signs: Secondary | ICD-10-CM | POA: Diagnosis not present

## 2015-05-02 DIAGNOSIS — F419 Anxiety disorder, unspecified: Secondary | ICD-10-CM | POA: Diagnosis present

## 2015-05-02 DIAGNOSIS — Z4589 Encounter for adjustment and management of other implanted devices: Secondary | ICD-10-CM | POA: Diagnosis not present

## 2015-07-25 DIAGNOSIS — D649 Anemia, unspecified: Secondary | ICD-10-CM | POA: Diagnosis not present

## 2015-07-25 DIAGNOSIS — E119 Type 2 diabetes mellitus without complications: Secondary | ICD-10-CM | POA: Diagnosis not present

## 2015-07-25 DIAGNOSIS — K219 Gastro-esophageal reflux disease without esophagitis: Secondary | ICD-10-CM | POA: Diagnosis not present

## 2015-07-25 DIAGNOSIS — J201 Acute bronchitis due to Hemophilus influenzae: Secondary | ICD-10-CM | POA: Diagnosis not present

## 2015-07-27 DIAGNOSIS — I10 Essential (primary) hypertension: Secondary | ICD-10-CM | POA: Diagnosis not present

## 2015-07-27 DIAGNOSIS — K219 Gastro-esophageal reflux disease without esophagitis: Secondary | ICD-10-CM | POA: Diagnosis not present

## 2015-07-27 DIAGNOSIS — E119 Type 2 diabetes mellitus without complications: Secondary | ICD-10-CM | POA: Diagnosis not present

## 2015-07-27 DIAGNOSIS — D649 Anemia, unspecified: Secondary | ICD-10-CM | POA: Diagnosis not present

## 2015-08-04 DIAGNOSIS — I10 Essential (primary) hypertension: Secondary | ICD-10-CM | POA: Diagnosis not present

## 2015-08-04 DIAGNOSIS — T783XXA Angioneurotic edema, initial encounter: Secondary | ICD-10-CM | POA: Diagnosis not present

## 2015-08-04 DIAGNOSIS — E119 Type 2 diabetes mellitus without complications: Secondary | ICD-10-CM | POA: Diagnosis not present

## 2015-08-07 DIAGNOSIS — Z1211 Encounter for screening for malignant neoplasm of colon: Secondary | ICD-10-CM | POA: Diagnosis not present

## 2015-08-10 DIAGNOSIS — I10 Essential (primary) hypertension: Secondary | ICD-10-CM | POA: Diagnosis not present

## 2015-08-10 DIAGNOSIS — T783XXA Angioneurotic edema, initial encounter: Secondary | ICD-10-CM | POA: Diagnosis not present

## 2015-08-23 DIAGNOSIS — I10 Essential (primary) hypertension: Secondary | ICD-10-CM | POA: Diagnosis not present

## 2015-09-06 DIAGNOSIS — I10 Essential (primary) hypertension: Secondary | ICD-10-CM | POA: Diagnosis not present

## 2015-09-12 ENCOUNTER — Encounter (HOSPITAL_COMMUNITY): Payer: Self-pay | Admitting: Emergency Medicine

## 2015-09-12 ENCOUNTER — Emergency Department (HOSPITAL_COMMUNITY): Payer: Medicare Other

## 2015-09-12 ENCOUNTER — Emergency Department (HOSPITAL_COMMUNITY)
Admission: EM | Admit: 2015-09-12 | Discharge: 2015-09-12 | Disposition: A | Discharge status: Home / Self Care | Payer: Medicare Other | Attending: Emergency Medicine | Admitting: Emergency Medicine

## 2015-09-12 DIAGNOSIS — R011 Cardiac murmur, unspecified: Secondary | ICD-10-CM | POA: Insufficient documentation

## 2015-09-12 DIAGNOSIS — Z8551 Personal history of malignant neoplasm of bladder: Secondary | ICD-10-CM | POA: Diagnosis not present

## 2015-09-12 DIAGNOSIS — E785 Hyperlipidemia, unspecified: Secondary | ICD-10-CM | POA: Diagnosis not present

## 2015-09-12 DIAGNOSIS — Z7982 Long term (current) use of aspirin: Secondary | ICD-10-CM | POA: Diagnosis not present

## 2015-09-12 DIAGNOSIS — I10 Essential (primary) hypertension: Secondary | ICD-10-CM | POA: Insufficient documentation

## 2015-09-12 DIAGNOSIS — Z8742 Personal history of other diseases of the female genital tract: Secondary | ICD-10-CM | POA: Insufficient documentation

## 2015-09-12 DIAGNOSIS — Z79899 Other long term (current) drug therapy: Secondary | ICD-10-CM | POA: Insufficient documentation

## 2015-09-12 DIAGNOSIS — G4489 Other headache syndrome: Secondary | ICD-10-CM | POA: Insufficient documentation

## 2015-09-12 DIAGNOSIS — Z7984 Long term (current) use of oral hypoglycemic drugs: Secondary | ICD-10-CM | POA: Insufficient documentation

## 2015-09-12 DIAGNOSIS — K6389 Other specified diseases of intestine: Secondary | ICD-10-CM | POA: Insufficient documentation

## 2015-09-12 DIAGNOSIS — Z87891 Personal history of nicotine dependence: Secondary | ICD-10-CM | POA: Insufficient documentation

## 2015-09-12 DIAGNOSIS — E119 Type 2 diabetes mellitus without complications: Secondary | ICD-10-CM | POA: Diagnosis not present

## 2015-09-12 DIAGNOSIS — Z8739 Personal history of other diseases of the musculoskeletal system and connective tissue: Secondary | ICD-10-CM | POA: Diagnosis not present

## 2015-09-12 DIAGNOSIS — R51 Headache: Secondary | ICD-10-CM | POA: Diagnosis present

## 2015-09-12 LAB — BASIC METABOLIC PANEL
Anion gap: 11 (ref 5–15)
BUN: 13 mg/dL (ref 6–20)
CO2: 26 mmol/L (ref 22–32)
Calcium: 9.3 mg/dL (ref 8.9–10.3)
Chloride: 104 mmol/L (ref 101–111)
Creatinine, Ser: 0.72 mg/dL (ref 0.44–1.00)
GFR calc Af Amer: >60 mL/min (ref >60)
GFR calc non Af Amer: >60 mL/min (ref >60)
GLUCOSE: 174 mg/dL — AB (ref 65–99)
POTASSIUM: 3.5 mmol/L (ref 3.5–5.1)
Sodium: 141 mmol/L (ref 135–145)

## 2015-09-12 LAB — CBC WITH DIFFERENTIAL/PLATELET
Basophils Absolute: 0.1 10*3/uL (ref 0.0–0.1)
Basophils Relative: 1 %
EOS PCT: 1 %
Eosinophils Absolute: 0 10*3/uL (ref 0.0–0.7)
HEMATOCRIT: 39.1 % (ref 36.0–46.0)
Hemoglobin: 12.7 g/dL (ref 12.0–15.0)
LYMPHS PCT: 30 %
Lymphs Abs: 2 10*3/uL (ref 0.7–4.0)
MCH: 29.1 pg (ref 26.0–34.0)
MCHC: 32.5 g/dL (ref 30.0–36.0)
MCV: 89.5 fL (ref 78.0–100.0)
MONO ABS: 0.4 10*3/uL (ref 0.1–1.0)
MONOS PCT: 6 %
Neutro Abs: 4.1 10*3/uL (ref 1.7–7.7)
Neutrophils Relative %: 62 %
PLATELETS: 402 10*3/uL — AB (ref 150–400)
RBC: 4.37 MIL/uL (ref 3.87–5.11)
RDW: 13.8 % (ref 11.5–15.5)
WBC: 6.6 10*3/uL (ref 4.0–10.5)

## 2015-09-12 NOTE — ED Notes (Signed)
Patient with no complaints at this time. Respirations even and unlabored. Skin warm/dry. Discharge instructions reviewed with patient at this time. Patient given opportunity to voice concerns/ask questions. IV removed per policy and band-aid applied to site. Patient discharged at this time and left Emergency Department with steady gait.  

## 2015-09-12 NOTE — Discharge Instructions (Signed)

## 2015-09-12 NOTE — ED Provider Notes (Signed)
CSN: [id number]     Arrival date & time 09/12/15  1222 History   First MD Initiated Contact with Patient 09/12/15 1251     Chief Complaint  Patient presents with  . Hypertension    Patient is a 70 y.o. female presenting with hypertension. The history is provided by the patient.  Hypertension This is a recurrent problem. The current episode started more than 1 week ago. The problem occurs daily. The problem has been gradually worsening. Associated symptoms include headaches. Pertinent negatives include no chest pain and no abdominal pain. Nothing aggravates the symptoms. Nothing relieves the symptoms.  Patient reports she has had issues with HTN for over a month Her PCP has tried altering medications recently but still having episodes of HTN She reports she is taking hydralazine and norvasc but still have HTN Over the past 24 hours she has had felt "foggy" and has had mild HA No focal facial/arm weakness She reports her tongue feels tingling and mouth is dry She felt mild SOB at home No CP reported  Past Medical History  Diagnosis Date  . Hypertension   . IBD (inflammatory bowel disease)   . Hyperlipidemia   . Diabetes mellitus   . Bladder tumor   . Heart murmur MILD-  ASYMPTOMATIC  . Urgency of urination   . Frequency of urination   . Hematuria   . DDD (degenerative disc disease) CERVICAL AND LUMBAR  . History of bladder cancer 12/25/2012  . Prolapse of vaginal vault after hysterectomy 12/25/2012   Past Surgical History  Procedure Laterality Date  . Bunionectomy  1980'S  . Hysteroscopy w/d&c  04/17/2012    Procedure: DILATATION AND CURETTAGE /HYSTEROSCOPY;  Surgeon: Jonnie Kind, MD;  Location: [facility];  Service: Gynecology;  Laterality: N/A;  POLYPECTOMY  . Tubal ligation  1984  . Breast biopsy  03-20-2011  DR [facility]    BENIGN LEFT BREAST MASS  . Transurethral resection of bladder tumor  05/29/2012    Procedure: TRANSURETHRAL RESECTION OF BLADDER TUMOR (TURBT);  Surgeon:  Claybon Jabs, MD;  [facility];  Service: Urology;  Laterality: N/A;  [place]  . Cystoscopy/retrograde/ureteroscopy  05/29/2012    Procedure: CYSTOSCOPY/RETROGRADE/URETEROSCOPY;  Surgeon: Claybon Jabs, MD;  Location: [facility];  Service: Urology;  Laterality: Right;   right  Retrograde Pyelogram   . Abdominal hysterectomy     Family History  Problem Relation Age of Onset  . Heart disease Mother   . Heart disease Father   . Cancer Sister     breast   . Diabetes Brother   . Cancer Brother     prostate   Social History  Substance Use Topics  . Smoking status: Former Smoker -- 10 years    Types: Cigarettes    Quit date: 05/26/1988  . Smokeless tobacco: Never Used  . Alcohol Use: No   OB History    Gravida Para Term Preterm AB TAB SAB Ectopic Multiple Living   3 3             Review of Systems  Constitutional: Positive for fatigue. Negative for fever.  Eyes: Negative for visual disturbance.  Cardiovascular: Negative for chest pain.  Gastrointestinal: Negative for abdominal pain.  Neurological: Positive for headaches. Negative for weakness.       Tongue numbness   All other systems reviewed and are negative.     Allergies  Lisinopril; Metoprolol; and Ceftin  Home Medications   Prior to Admission medications  Medication Sig Start Date End Date Taking? Authorizing Provider  amLODipine (NORVASC) 10 MG tablet Take 10 mg by mouth at bedtime.     Historical Provider, MD  aspirin EC 81 MG tablet 81 mg. Take 81 mg by mouth nightly.    Historical Provider, MD  loratadine (CLARITIN) 10 MG tablet Take 10 mg by mouth as needed.     Historical Provider, MD  Mesalamine (ASACOL HD) 800 MG TBEC Take 1 tablet by mouth 2 (two) times daily.    Historical Provider, MD  metFORMIN (GLUCOPHAGE-XR) 500 MG 24 hr tablet Take 500 mg by mouth every evening.     Historical Provider, MD  metoprolol succinate (TOPROL-XL) 25 MG 24 hr tablet Take 0.5 tablets  (12.5 mg total) by mouth daily. 11/15/13   Evalee Jefferson, PA-C  Multiple Vitamins-Minerals (CENTRUM SILVER PO) Take 1 tablet by mouth daily.     Historical Provider, MD  pantoprazole (PROTONIX) 20 MG tablet Take 20 mg by mouth daily.    Historical Provider, MD  potassium chloride SA (K-DUR,KLOR-CON) 20 MEQ tablet 20 mEq. Take 20 mEq by mouth every morning.    Historical Provider, MD  pravastatin (PRAVACHOL) 40 MG tablet Take 40 mg by mouth every morning.     Historical Provider, MD  ramipril (ALTACE) 10 MG capsule Take 10 mg by mouth every morning.     Historical Provider, MD   BP 166/68 mmHg  Pulse 86  Temp(Src) 98.6 F (37 C)  Resp 21  Ht 5\' 5"  (1.651 m)  Wt 72.576 kg  BMI 26.63 kg/m2  SpO2 99% Physical Exam CONSTITUTIONAL: Well developed/well nourished, anxious HEAD: Normocephalic/atraumatic EYES: EOMI/PERRL, no nystagmus, no ptosis ENMT: Mucous membranes moist NECK: supple no meningeal signs, no bruits CV: S1/S2 noted, no murmurs/rubs/gallops noted LUNGS: Lungs are clear to auscultation bilaterally, no apparent distress ABDOMEN: soft, nontender, no rebound or guarding GU:no cva tenderness NEURO:Awake/alert, face symmetric, no arm or leg drift is noted Equal 5/5 strength with shoulder abduction, elbow flex/extension, wrist flex/extension in upper extremities and equal hand grips bilaterally Equal 5/5 strength with hip flexion,knee flex/extension, foot dorsi/plantar flexion Cranial nerves 3/4/5/6/02/03/09/11/12 tested and intact Gait normal without ataxia No past pointing Sensation to light touch intact in all extremities EXTREMITIES: pulses normal, full ROM SKIN: warm, color normal PSYCH: she is anxious  ED Course  Procedures  Labs Review Labs Reviewed  BASIC METABOLIC PANEL - Abnormal; Notable for the following:    Glucose, Bld 174 (*)    All other components within normal limits  CBC WITH DIFFERENTIAL/PLATELET - Abnormal; Notable for the following:    Platelets 402 (*)     All other components within normal limits    Imaging Review Ct Head Wo Contrast  09/12/2015  CLINICAL DATA:  High blood pressure and headache this morning. Patient felt shortness of breath while walking. EXAM: CT HEAD WITHOUT CONTRAST TECHNIQUE: Contiguous axial images were obtained from the base of the skull through the vertex without intravenous contrast. COMPARISON:  None. FINDINGS: There is no midline shift, hydrocephalus, or mass. No acute hemorrhage or acute transcortical infarct is identified. The bony calvarium is intact. The visualized sinuses are clear. IMPRESSION: Normal head CT. Electronically Signed   By: Abelardo Diesel M.D.   On: 09/12/2015 13:48   I have personally reviewed and evaluated these lab results as part of my medical decision-making.   EKG Interpretation   Date/Time:  Tuesday September 12 2015 12:30:01 EST Ventricular Rate:  100 PR Interval:  146 QRS Duration:  88 QT Interval:  364 QTC Calculation: 469 R Axis:   19 Text Interpretation:  Normal sinus rhythm Nonspecific ST and T wave  abnormality Abnormal ECG Confirmed by Christy Gentles  MD, Elenore Rota ([id number]) on  09/12/2015 1:15:02 PM     2:30 PM Labs reassuring, CT head negative Pt admits she has had difficulty controlling BP since December recently started on hydralazine 50mg  TID Currently she is without any focal weakness or arm/leg numbness No facial droop is noted She is in no distress No signs of acute stroke noted She reports tongue was "tingling" and felt dry  But tongue is midline and no cranial nerve deficits I don't feel further workup warranted BP improved here Will continue her home meds If she notices her BP continues to increase at home over next 24 hours ,she will take hydralazine 50QID Otherwise she has f/u next week Attempted to call PCP but not available due to death in the family  MDM   Final diagnoses:  Essential hypertension  Other headache syndrome    Nursing notes including past medical  history and social history reviewed and considered in documentation Labs/vital reviewed myself and considered during evaluation     Ripley Fraise, MD 09/12/15 1434

## 2015-09-12 NOTE — ED Notes (Signed)
PT states recent blood pressure medication change d/t HTN. PT states her blood pressure was reading high this morning and she felt SOB while walking. PT also states she was a headache. B/P 205/74 in triage and pt denies taking any medications today.

## 2015-09-20 DIAGNOSIS — I10 Essential (primary) hypertension: Secondary | ICD-10-CM | POA: Diagnosis not present

## 2015-10-03 DIAGNOSIS — I10 Essential (primary) hypertension: Secondary | ICD-10-CM | POA: Diagnosis not present

## 2015-10-11 DIAGNOSIS — I10 Essential (primary) hypertension: Secondary | ICD-10-CM | POA: Diagnosis not present

## 2015-10-18 DIAGNOSIS — I10 Essential (primary) hypertension: Secondary | ICD-10-CM | POA: Diagnosis not present

## 2015-10-24 DIAGNOSIS — K219 Gastro-esophageal reflux disease without esophagitis: Secondary | ICD-10-CM | POA: Diagnosis not present

## 2015-10-24 DIAGNOSIS — I10 Essential (primary) hypertension: Secondary | ICD-10-CM | POA: Diagnosis not present

## 2015-10-24 DIAGNOSIS — E119 Type 2 diabetes mellitus without complications: Secondary | ICD-10-CM | POA: Diagnosis not present

## 2015-10-24 DIAGNOSIS — D649 Anemia, unspecified: Secondary | ICD-10-CM | POA: Diagnosis not present

## 2015-10-26 DIAGNOSIS — K219 Gastro-esophageal reflux disease without esophagitis: Secondary | ICD-10-CM | POA: Diagnosis not present

## 2015-10-26 DIAGNOSIS — I10 Essential (primary) hypertension: Secondary | ICD-10-CM | POA: Diagnosis not present

## 2015-10-26 DIAGNOSIS — E119 Type 2 diabetes mellitus without complications: Secondary | ICD-10-CM | POA: Diagnosis not present

## 2015-10-26 DIAGNOSIS — C679 Malignant neoplasm of bladder, unspecified: Secondary | ICD-10-CM | POA: Diagnosis not present

## 2015-11-29 DIAGNOSIS — I10 Essential (primary) hypertension: Secondary | ICD-10-CM | POA: Diagnosis not present

## 2016-01-10 ENCOUNTER — Other Ambulatory Visit (HOSPITAL_COMMUNITY): Payer: Self-pay | Admitting: Pulmonary Disease

## 2016-01-10 DIAGNOSIS — Z1231 Encounter for screening mammogram for malignant neoplasm of breast: Secondary | ICD-10-CM

## 2016-01-23 ENCOUNTER — Other Ambulatory Visit (HOSPITAL_COMMUNITY): Payer: Self-pay | Admitting: Pulmonary Disease

## 2016-01-23 DIAGNOSIS — M7989 Other specified soft tissue disorders: Secondary | ICD-10-CM

## 2016-01-23 DIAGNOSIS — I10 Essential (primary) hypertension: Secondary | ICD-10-CM

## 2016-01-23 DIAGNOSIS — H9313 Tinnitus, bilateral: Secondary | ICD-10-CM | POA: Diagnosis not present

## 2016-01-23 DIAGNOSIS — E119 Type 2 diabetes mellitus without complications: Secondary | ICD-10-CM | POA: Diagnosis not present

## 2016-01-23 DIAGNOSIS — C679 Malignant neoplasm of bladder, unspecified: Secondary | ICD-10-CM | POA: Diagnosis not present

## 2016-01-26 ENCOUNTER — Ambulatory Visit (HOSPITAL_COMMUNITY)
Admission: RE | Admit: 2016-01-26 | Discharge: 2016-01-26 | Disposition: A | Discharge status: Home / Self Care | Payer: Medicare Other | Source: Ambulatory Visit | Attending: Pulmonary Disease | Admitting: Pulmonary Disease

## 2016-01-26 DIAGNOSIS — I6523 Occlusion and stenosis of bilateral carotid arteries: Secondary | ICD-10-CM | POA: Insufficient documentation

## 2016-01-26 DIAGNOSIS — M7989 Other specified soft tissue disorders: Secondary | ICD-10-CM | POA: Insufficient documentation

## 2016-01-26 DIAGNOSIS — I10 Essential (primary) hypertension: Secondary | ICD-10-CM | POA: Diagnosis not present

## 2016-02-01 ENCOUNTER — Ambulatory Visit (HOSPITAL_COMMUNITY)
Admission: RE | Admit: 2016-02-01 | Discharge: 2016-02-01 | Disposition: A | Discharge status: Home / Self Care | Payer: Medicare Other | Source: Ambulatory Visit | Attending: Pulmonary Disease | Admitting: Pulmonary Disease

## 2016-02-01 DIAGNOSIS — Z1231 Encounter for screening mammogram for malignant neoplasm of breast: Secondary | ICD-10-CM | POA: Diagnosis present

## 2016-02-22 DIAGNOSIS — E119 Type 2 diabetes mellitus without complications: Secondary | ICD-10-CM | POA: Diagnosis not present

## 2016-02-22 DIAGNOSIS — I10 Essential (primary) hypertension: Secondary | ICD-10-CM | POA: Diagnosis not present

## 2016-02-22 DIAGNOSIS — C679 Malignant neoplasm of bladder, unspecified: Secondary | ICD-10-CM | POA: Diagnosis not present

## 2016-02-22 DIAGNOSIS — R002 Palpitations: Secondary | ICD-10-CM | POA: Diagnosis not present

## 2016-02-23 DIAGNOSIS — I10 Essential (primary) hypertension: Secondary | ICD-10-CM | POA: Diagnosis not present

## 2016-02-23 DIAGNOSIS — C679 Malignant neoplasm of bladder, unspecified: Secondary | ICD-10-CM | POA: Diagnosis not present

## 2016-02-23 DIAGNOSIS — E119 Type 2 diabetes mellitus without complications: Secondary | ICD-10-CM | POA: Diagnosis not present

## 2016-02-23 DIAGNOSIS — R002 Palpitations: Secondary | ICD-10-CM | POA: Diagnosis not present

## 2016-03-05 ENCOUNTER — Encounter: Payer: Self-pay | Admitting: *Deleted

## 2016-03-07 NOTE — Progress Notes (Signed)
Cardiology Office Note   Date:  03/09/2016   ID:  April Gomez, DOB 09-28-1945, MRN [id number]  PCP:  Alonza Bogus, MD  Cardiologist:   Minus Breeding, MD   Chief Complaint  Patient presents with  . Hypertension      History of Present Illness: April Gomez is a 70 y.o. female who presents for evaluation of hypertension and palpitations.  The patient doesn't recall any past cardiac history. However, I was able to find an echocardiogram from 2014.  This demonstrated a well preserved ejection fraction. There was some mild mitral regurgitation. She had atrial septal bowing consistent with left atrial pressure increase.    She has been followed by Dr. Luan Pulling for HTN.  This has been difficult to control. She's been intolerant ACE inhibitors recently with what she thought was some tongue swelling. She's been on hydralazine at increasing doses but she does not like this medication. She says it makes her legs feel weak. She was on metoprolol for a while but she thought this caused bruising. She takes her blood pressure twice a day. She was actually in the emergency room in February and I reviewed these records. Her blood pressure was elevated at that time and she had a head CT which was unremarkable. Her hydralazine was doubled at that appointment. She's had carotid Dopplers which demonstrated minimal plaque.    She is very anxious about her blood pressure. She's nervous and almost tearful in the office today. She says she feels a throbbing in her head which is pounding palpitation with her heartbeat. She can hear it swishing in her years. She's not describing tachycardia arrhythmias or any irregular rhythm necessarily. She has tiredness. She described chest discomfort, neck or arm discomfort. She's had no new shortness of breath, PND or orthopnea.   Past Medical History:  Diagnosis Date  . DDD (degenerative disc disease) CERVICAL AND LUMBAR  . Diabetes mellitus   . Hematuria   .  History of bladder cancer 12/25/2012  . Hyperlipidemia   . Hypertension   . IBD (inflammatory bowel disease)   . Prolapse of vaginal vault after hysterectomy 12/25/2012  . Urgency of urination     Past Surgical History:  Procedure Laterality Date  . ABDOMINAL HYSTERECTOMY    . BREAST BIOPSY  03-20-2011  DR STRECK   BENIGN LEFT BREAST MASS  . BUNIONECTOMY  1980'S  . CYSTOSCOPY/RETROGRADE/URETEROSCOPY  05/29/2012   Procedure: CYSTOSCOPY/RETROGRADE/URETEROSCOPY;  Surgeon: Claybon Jabs, MD;  Location: [facility];  Service: Urology;  Laterality: Right;   right  Retrograde Pyelogram   . HYSTEROSCOPY W/D&C  04/17/2012   Procedure: DILATATION AND CURETTAGE /HYSTEROSCOPY;  Surgeon: Jonnie Kind, MD;  Location: [facility];  Service: Gynecology;  Laterality: N/A;  POLYPECTOMY  . TRANSURETHRAL RESECTION OF BLADDER TUMOR  05/29/2012   Procedure: TRANSURETHRAL RESECTION OF BLADDER TUMOR (TURBT);  Surgeon: Claybon Jabs, MD;  Location: [facility];  Service: Urology;  Laterality: N/A;  [place]  . TUBAL LIGATION  1984     Current Outpatient Prescriptions  Medication Sig Dispense Refill  . albuterol (PROVENTIL HFA;VENTOLIN HFA) 108 (90 Base) MCG/ACT inhaler Inhale 2 puffs into the lungs every 6 (six) hours as needed for wheezing or shortness of breath.    Marland Kitchen amLODipine (NORVASC) 10 MG tablet Take 10 mg by mouth at bedtime.     Marland Kitchen aspirin EC 81 MG tablet 81 mg. Take 81 mg by mouth nightly.    . loratadine (  CLARITIN) 10 MG tablet Take 10 mg by mouth as needed.     Marland Kitchen LORazepam (ATIVAN) 1 MG tablet Take 1 mg by mouth every 8 (eight) hours as needed for anxiety.    . Mesalamine (ASACOL HD) 800 MG TBEC Take 1 tablet by mouth 2 (two) times daily.    . metFORMIN (GLUCOPHAGE-XR) 500 MG 24 hr tablet Take 500 mg by mouth every evening.     . Multiple Vitamins-Minerals (CENTRUM SILVER PO) Take 1 tablet by mouth daily.     . pantoprazole (PROTONIX) 20 MG tablet Take 40 mg by mouth daily.      . potassium chloride SA (K-DUR,KLOR-CON) 20 MEQ tablet 20 mEq. Take 20 mEq by mouth every morning.    . pravastatin (PRAVACHOL) 40 MG tablet Take 40 mg by mouth every morning.     . hydrALAZINE (APRESOLINE) 50 MG tablet Take 1 tablet (50 mg total) by mouth 3 (three) times daily. 270 tablet 3  . metoprolol tartrate (LOPRESSOR) 25 MG tablet Take 1 tablet (25 mg total) by mouth 2 (two) times daily. 180 tablet 1   No current facility-administered medications for this visit.    Facility-Administered Medications Ordered in Other Visits  Medication Dose Route Frequency Provider Last Rate Last Dose  . mitomycin (MUTAMYCIN) chemo injection 40 mg  40 mg Bladder Instillation Once Kathie Rhodes, MD        Allergies:   Lisinopril; Metoprolol; and Ceftin [cefuroxime axetil]    Social History:  The patient  reports that she quit smoking about 27 years ago. Her smoking use included Cigarettes. She quit after 10.00 years of use. She has never used smokeless tobacco. She reports that she does not drink alcohol or use drugs.   Family History:  The patient's family history includes COPD in her father; Cancer in her brother and sister; Diabetes in her brother; Heart attack in her sister; Heart disease in her mother; Heart disease (age of onset: 25) in her sister.    ROS:  Please see the history of present illness.   Otherwise, review of systems are positive for none.   All other systems are reviewed and negative.    PHYSICAL EXAM: VS:  BP (!) 150/80   Pulse 96   Ht 5\' 5"  (1.651 m)   Wt 164 lb (74.4 kg)   SpO2 95%   BMI 27.29 kg/m  , BMI Body mass index is 27.29 kg/m. GENERAL:  Well appearing HEENT:  Pupils equal round and reactive, fundi not visualized, oral mucosa unremarkable NECK:  No jugular venous distention, waveform within normal limits, carotid upstroke brisk and symmetric, no bruits, no thyromegaly LYMPHATICS:  No cervical, inguinal adenopathy LUNGS:  Clear to auscultation  bilaterally BACK:  No CVA tenderness CHEST:  Unremarkable HEART:  PMI not displaced or sustained,S1 and S2 within normal limits, no S3, no S4, no clicks, no rubs, no murmurs ABD:  Flat, positive bowel sounds normal in frequency in pitch, no bruits, no rebound, no guarding, no midline pulsatile mass, no hepatomegaly, no splenomegaly EXT:  2 plus pulses throughout, left leg swelling, no cyanosis no clubbing SKIN:  No rashes no nodules NEURO:  Cranial nerves II through XII grossly intact, motor grossly intact throughout PSYCH:  Cognitively intact, oriented to person place and time    EKG:  EKG is ordered today. The ekg ordered today demonstrates NSR, rate 95, axis within normal limits, poor anterior R wave progression, left atrial enlargement.   Recent Labs: 09/12/2015: BUN 13; Creatinine,  Ser 0.72; Hemoglobin 12.7; Platelets 402; Potassium 3.5; Sodium 141    Lipid Panel No results found for: CHOL, TRIG, HDL, CHOLHDL, VLDL, LDLCALC, LDLDIRECT    Wt Readings from Last 3 Encounters:  03/08/16 164 lb (74.4 kg)  09/12/15 160 lb (72.6 kg)  11/15/13 160 lb (72.6 kg)      Other studies Reviewed: Additional studies/ records that were reviewed today include: Previous echo, and ED records. Review of the above records demonstrates:  Please see elsewhere in the note.     ASSESSMENT AND PLAN:  HTN:  I am going to start slowly changing her medication and told her that this is going to be a slow and steady process and that she'll need to be on multiple medications and may have to tolerate some side effects.  I am going to decrease her hydralazine and start her back on metoprolol immediate release 25 mg twice daily. She continues an extra 50 mg of hydralazine if she has a hypertensive urgency. Otherwise my plan would be to titrate that medication to off at the next visit while going up on her beta blocker. She will continue the amlodipine. She also needs control of her anxiety and we talked about  during use of the Ativan which she's already had prescribed.  LAE:  She did have an echocardiogram that was slightly abnormal years ago and I will eventually follow-up with a repeat echocardiogram.  CAROTID STENOSIS:  This was very mildly and be followed up in a couple of years.   FAMILY HISTORY:   She has a remarkable family history and I would like eventually when her blood pressure is controlled to screen her with a stress test.   PALPITATIONS:  I am going to check a 48 hour Holter.   Current medicines are reviewed at length with the patient today.  The patient does not have concerns regarding medicines.  The following changes have been made:  As above  Labs/ tests ordered today include:   Orders Placed This Encounter  Procedures  . Holter monitor - 48 hour  . EKG 12-Lead     Disposition:   FU with me in one month    Signed, Minus Breeding, MD  03/09/2016 9:32 AM    [place]

## 2016-03-08 ENCOUNTER — Encounter: Payer: Self-pay | Admitting: Cardiology

## 2016-03-08 ENCOUNTER — Ambulatory Visit (INDEPENDENT_AMBULATORY_CARE_PROVIDER_SITE_OTHER): Payer: Medicare Other | Admitting: Cardiology

## 2016-03-08 VITALS — BP 150/80 | HR 96 | Ht 65.0 in | Wt 164.0 lb

## 2016-03-08 DIAGNOSIS — R002 Palpitations: Secondary | ICD-10-CM | POA: Diagnosis not present

## 2016-03-08 DIAGNOSIS — I10 Essential (primary) hypertension: Secondary | ICD-10-CM

## 2016-03-08 MED ORDER — HYDRALAZINE HCL 50 MG PO TABS: 50.0000 mg | ORAL_TABLET | Freq: Three times a day (TID) | ORAL | 3 refills | Status: DC

## 2016-03-08 MED ORDER — METOPROLOL TARTRATE 25 MG PO TABS: 25.0000 mg | ORAL_TABLET | Freq: Two times a day (BID) | ORAL | 1 refills | Status: DC

## 2016-03-08 NOTE — Patient Instructions (Signed)
Medication Instructions:   Your physician has recommended you make the following change in your medication:   Decrease hydralazine 50 mg to three times daily.  Start metoprolol tartrate 25 mg twice daily.  Continue all other medications the same.  Labwork: NONE  Testing/Procedures: Your physician has recommended that you wear a 48 hour holter monitor. Holter monitors are medical devices that record the heart's electrical activity. Doctors most often use these monitors to diagnose arrhythmias. Arrhythmias are problems with the speed or rhythm of the heartbeat. The monitor is a small, portable device. You can wear one while you do your normal daily activities. This is usually used to diagnose what is causing palpitations/syncope (passing out). This will be scheduled to be done at [place]:  Your physician recommends that you schedule a follow-up appointment in: 2 weeks with Jory Sims NP at the [place] office.  Any Other Special Instructions Will Be Listed Below (If Applicable).  If you need a refill on your cardiac medications before your next appointment, please call your pharmacy.

## 2016-03-09 ENCOUNTER — Encounter: Payer: Self-pay | Admitting: Cardiology

## 2016-03-13 ENCOUNTER — Ambulatory Visit (HOSPITAL_COMMUNITY)
Admission: RE | Admit: 2016-03-13 | Discharge: 2016-03-13 | Disposition: A | Discharge status: Home / Self Care | Payer: Medicare Other | Source: Ambulatory Visit | Attending: Cardiology | Admitting: Cardiology

## 2016-03-13 DIAGNOSIS — R002 Palpitations: Secondary | ICD-10-CM | POA: Insufficient documentation

## 2016-03-18 ENCOUNTER — Inpatient Hospital Stay (HOSPITAL_COMMUNITY): Admission: RE | Admit: 2016-03-18 | Payer: Medicare Other | Source: Ambulatory Visit

## 2016-03-20 ENCOUNTER — Telehealth: Payer: Self-pay | Admitting: *Deleted

## 2016-03-20 NOTE — Telephone Encounter (Signed)
-----   Message from Minus Breeding, MD sent at 03/18/2016  8:23 PM EDT ----- No evidence of arrhythmia.  Call Ms. Fogal with the results and send results to Alonza Bogus, MD

## 2016-03-20 NOTE — Telephone Encounter (Signed)
Patient informed and copy sent to PCP. 

## 2016-03-25 ENCOUNTER — Encounter: Payer: Self-pay | Admitting: Cardiology

## 2016-03-25 ENCOUNTER — Ambulatory Visit (INDEPENDENT_AMBULATORY_CARE_PROVIDER_SITE_OTHER): Payer: Medicare Other | Admitting: Cardiology

## 2016-03-25 ENCOUNTER — Ambulatory Visit: Payer: Medicare Other | Admitting: Cardiology

## 2016-03-25 VITALS — BP 152/78 | HR 86 | Ht 65.5 in | Wt 162.0 lb

## 2016-03-25 DIAGNOSIS — Z888 Allergy status to other drugs, medicaments and biological substances status: Secondary | ICD-10-CM

## 2016-03-25 DIAGNOSIS — I10 Essential (primary) hypertension: Secondary | ICD-10-CM | POA: Diagnosis not present

## 2016-03-25 DIAGNOSIS — Z8551 Personal history of malignant neoplasm of bladder: Secondary | ICD-10-CM

## 2016-03-25 DIAGNOSIS — E785 Hyperlipidemia, unspecified: Secondary | ICD-10-CM

## 2016-03-25 DIAGNOSIS — Z789 Other specified health status: Secondary | ICD-10-CM | POA: Insufficient documentation

## 2016-03-25 DIAGNOSIS — F419 Anxiety disorder, unspecified: Secondary | ICD-10-CM

## 2016-03-25 DIAGNOSIS — R002 Palpitations: Secondary | ICD-10-CM | POA: Diagnosis not present

## 2016-03-25 DIAGNOSIS — Z9289 Personal history of other medical treatment: Secondary | ICD-10-CM | POA: Diagnosis not present

## 2016-03-25 DIAGNOSIS — T464X1A Poisoning by angiotensin-converting-enzyme inhibitors, accidental (unintentional), initial encounter: Secondary | ICD-10-CM

## 2016-03-25 MED ORDER — CHLORTHALIDONE 25 MG PO TABS: 25.0000 mg | ORAL_TABLET | Freq: Every day | ORAL | 3 refills | Status: DC

## 2016-03-25 NOTE — Assessment & Plan Note (Signed)
Pt has urostomy  

## 2016-03-25 NOTE — Assessment & Plan Note (Signed)
Type 2 NIDDM-normal renal function

## 2016-03-25 NOTE — Assessment & Plan Note (Signed)
Holter did not show any arrhthmia

## 2016-03-25 NOTE — Assessment & Plan Note (Signed)
On statin Rx 

## 2016-03-25 NOTE — Assessment & Plan Note (Signed)
"  Lip swelling"

## 2016-03-25 NOTE — Patient Instructions (Signed)
Medication Instructions:  Take amlodipine in the morning  Start chlorthalidone 25 mg daily - take before bedtime   Labwork: Your physician recommends that you return for lab work in: 2 weeks  Bmp    Testing/Procedures: NONE  Follow-Up:Your physician recommends that you schedule a follow-up appointment in: 2 WEEKS FOR A NURSE VISIT TO CHECK YOUR BLOOD PRESSURE    Any Other Special Instructions Will Be Listed Below (If Applicable).     If you need a refill on your cardiac medications before your next appointment, please call your pharmacy.

## 2016-03-25 NOTE — Assessment & Plan Note (Signed)
B/P is still a little high

## 2016-03-25 NOTE — Progress Notes (Signed)
03/25/2016 April Gomez   [birth year]  [id number]  Primary Physician HAWKINS,EDWARD L, MD Primary Cardiologist: Dr Percival Spanish  HPI: Pleasant 70 y/o AA female followed by Dr. Luan Pulling for HTN.  This has been difficult to control. She's been intolerant ACE inhibitors with tongue and lip swelling. She's been on hydralazine at increasing doses but says it makes her legs feel weak. She saw Dr Percival Spanish 03/09/16. He added Lopressor and decreased her Hydrazine 100 mg TID to 50 mg TID. A 48 Hr Holter was done for palpitations and this was unremarkable. She is in the office today for B/P follow up. She feels better on the reduced Hydralazine, less leg weakness. She is tolerating the Lopressor, her HR at home runs 60-70. Her B/P remains elevated with a systolic running [id number]. She denies chest pain or dyspnea.    Current Outpatient Prescriptions  Medication Sig Dispense Refill  . albuterol (PROVENTIL HFA;VENTOLIN HFA) 108 (90 Base) MCG/ACT inhaler Inhale 2 puffs into the lungs every 6 (six) hours as needed for wheezing or shortness of breath.    Marland Kitchen amLODipine (NORVASC) 10 MG tablet Take 10 mg by mouth daily with breakfast.    . aspirin EC 81 MG tablet 81 mg. Take 81 mg by mouth nightly.    . hydrALAZINE (APRESOLINE) 50 MG tablet Take 1 tablet (50 mg total) by mouth 3 (three) times daily. 270 tablet 3  . loratadine (CLARITIN) 10 MG tablet Take 10 mg by mouth as needed.     Marland Kitchen LORazepam (ATIVAN) 1 MG tablet Take 1 mg by mouth every 8 (eight) hours as needed for anxiety.    . Mesalamine (ASACOL HD) 800 MG TBEC Take 1 tablet by mouth 2 (two) times daily.    . metFORMIN (GLUCOPHAGE-XR) 500 MG 24 hr tablet Take 500 mg by mouth every evening.     . metoprolol tartrate (LOPRESSOR) 25 MG tablet Take 1 tablet (25 mg total) by mouth 2 (two) times daily. 180 tablet 1  . Multiple Vitamins-Minerals (CENTRUM SILVER PO) Take 1 tablet by mouth daily.     . pantoprazole (PROTONIX) 20 MG tablet Take 40 mg by mouth daily.      . potassium chloride SA (K-DUR,KLOR-CON) 20 MEQ tablet 20 mEq. Take 20 mEq by mouth every morning.    . pravastatin (PRAVACHOL) 40 MG tablet Take 40 mg by mouth every morning.     . chlorthalidone (HYGROTON) 25 MG tablet Take 1 tablet (25 mg total) by mouth at bedtime. 90 tablet 3   No current facility-administered medications for this visit.    Facility-Administered Medications Ordered in Other Visits  Medication Dose Route Frequency Provider Last Rate Last Dose  . mitomycin (MUTAMYCIN) chemo injection 40 mg  40 mg Bladder Instillation Once Kathie Rhodes, MD        Allergies  Allergen Reactions  . Lisinopril Swelling  . Metoprolol Swelling    Bruising - not sure if it was an allergy   . Ceftin [Cefuroxime Axetil] Rash    Social History   Social History  . Marital status: Married    Spouse name: N/A  . Number of children: N/A  . Years of education: N/A   Occupational History  . Not on file.   Social History Main Topics  . Smoking status: Former Smoker    Years: 10.00    Types: Cigarettes    Quit date: 05/26/1988  . Smokeless tobacco: Never Used  . Alcohol use No  . Drug use: No  .  Sexual activity: No   Other Topics Concern  . Not on file   Social History Narrative  . No narrative on file     Review of Systems: General: negative for chills, fever, night sweats or weight changes.  Cardiovascular: negative for chest pain, dyspnea on exertion, edema, orthopnea, palpitations, paroxysmal nocturnal dyspnea or shortness of breath Dermatological: negative for rash Respiratory: negative for cough or wheezing Urologic: negative for hematuria Abdominal: negative for nausea, vomiting, diarrhea, bright red blood per rectum, melena, or hematemesis Neurologic: negative for visual changes, syncope, or dizziness All other systems reviewed and are otherwise negative except as noted above.    Blood pressure (!) 152/78, pulse 86, height 5' 5.5" (1.664 m), weight 162 lb (73.5  kg), SpO2 96 %.  General appearance: alert, cooperative and no distress Neck: no carotid bruit and no JVD Lungs: clear to auscultation bilaterally Heart: regular rate and rhythm Extremities: no edema Skin: Skin color, texture, turgor normal. No rashes or lesions Neurologic: Grossly normal   ASSESSMENT AND PLAN:   Essential hypertension B/P is still a little high  History of bladder cancer Pt has urostomy  Palpitations Holter did not show any arrhthmia  Dyslipidemia On statin Rx  Type 2 diabetes mellitus with chronic kidney disease and hypertension (HCC) Type 2 NIDDM-normal renal function  ACE inhibitor intolerance "Lip swelling"   PLAN  I added chlorthalidone 25 mg Q HS. She has a urostomy that she connects to drainage at night. I suggested she take her Amlodipine in the morning, not sure why she is taking it Q HS. I was hesitant to increase her beta blocker as her HR was 60-70 but this could be considered if needed in the future. She needs a B/P check and a BMP in 2 weeks. Once her B/P is stable she'll need an echo.   Kerin Ransom PA-C 03/25/2016 12:05 PM

## 2016-04-02 DIAGNOSIS — J439 Emphysema, unspecified: Secondary | ICD-10-CM | POA: Diagnosis not present

## 2016-04-02 DIAGNOSIS — C678 Malignant neoplasm of overlapping sites of bladder: Secondary | ICD-10-CM | POA: Diagnosis not present

## 2016-04-02 DIAGNOSIS — R918 Other nonspecific abnormal finding of lung field: Secondary | ICD-10-CM | POA: Diagnosis not present

## 2016-04-02 DIAGNOSIS — K435 Parastomal hernia without obstruction or  gangrene: Secondary | ICD-10-CM | POA: Diagnosis not present

## 2016-04-02 DIAGNOSIS — Z9889 Other specified postprocedural states: Secondary | ICD-10-CM | POA: Diagnosis not present

## 2016-04-02 DIAGNOSIS — K769 Liver disease, unspecified: Secondary | ICD-10-CM | POA: Diagnosis not present

## 2016-04-02 DIAGNOSIS — N2889 Other specified disorders of kidney and ureter: Secondary | ICD-10-CM | POA: Diagnosis not present

## 2016-04-02 DIAGNOSIS — Z08 Encounter for follow-up examination after completed treatment for malignant neoplasm: Secondary | ICD-10-CM | POA: Diagnosis not present

## 2016-04-02 DIAGNOSIS — Z8551 Personal history of malignant neoplasm of bladder: Secondary | ICD-10-CM | POA: Diagnosis not present

## 2016-04-08 DIAGNOSIS — I10 Essential (primary) hypertension: Secondary | ICD-10-CM | POA: Diagnosis not present

## 2016-04-09 ENCOUNTER — Ambulatory Visit (INDEPENDENT_AMBULATORY_CARE_PROVIDER_SITE_OTHER): Payer: Medicare Other

## 2016-04-09 VITALS — BP 136/78 | HR 80 | Ht 65.0 in | Wt 162.0 lb

## 2016-04-09 DIAGNOSIS — Z136 Encounter for screening for cardiovascular disorders: Secondary | ICD-10-CM | POA: Diagnosis not present

## 2016-04-09 DIAGNOSIS — Z0130 Encounter for examination of blood pressure without abnormal findings: Secondary | ICD-10-CM

## 2016-04-09 LAB — BASIC METABOLIC PANEL
BUN: 19 mg/dL (ref 7–25)
CO2: 27 mmol/L (ref 20–31)
Calcium: 9.6 mg/dL (ref 8.6–10.4)
Chloride: 98 mmol/L (ref 98–110)
Creat: 0.87 mg/dL (ref 0.60–0.93)
Glucose, Bld: 163 mg/dL — ABNORMAL HIGH (ref 65–99)
Potassium: 3.3 mmol/L — ABNORMAL LOW (ref 3.5–5.3)
Sodium: 135 mmol/L (ref 135–146)

## 2016-04-09 NOTE — Progress Notes (Signed)
Patient returns for BP check after visit with L Kilroy PA-C in August. She reports BP by home machine has run [id number] systolic,highest had one reading was 0000000 systolic. Patient states she feels better by taking Amlodipine at night and chlorthalidone at bedtime.    Had lab work yesterday,will show now to [employer], reviewed asked patient to include a banana in diet daily along with her potassium    F/U 6 months Dr Percival Spanish

## 2016-04-09 NOTE — Patient Instructions (Addendum)
Continue all meds as directed,eat a banana daily, per L Kilroy PA-C   See Dr Percival Spanish at [place] office in 6 months   Thank you for choosing [facility] !      Marland Kitchen

## 2016-04-22 ENCOUNTER — Telehealth: Payer: Self-pay | Admitting: Cardiology

## 2016-04-22 NOTE — Telephone Encounter (Signed)
Call placed to [facility] at [facility]  Said they never received.  Initially done on 03/08/2016.  Verbal refill given today.

## 2016-04-22 NOTE — Telephone Encounter (Signed)
hydrALAZINE (APRESOLINE) 50 MG tablet   [facility] called [facility] in [place] told her to have April Gomez call them in reference to this RX.  They told her that we never sent in the prescription

## 2016-04-22 NOTE — Telephone Encounter (Signed)
°*  STAT* If patient is at the pharmacy, call can be transferred to refill team.   1. Which medications need to be refilled? (please list name of each medication and dose if known) new prescription for Hydralazine  2. Which pharmacy/location (including street and city if local pharmacy) is medication to be sent to?[facility]-[phone] 3. Do they need a 30 day or 90 day supply? ?? Whatever new prescription calls for

## 2016-05-24 DIAGNOSIS — E119 Type 2 diabetes mellitus without complications: Secondary | ICD-10-CM | POA: Diagnosis not present

## 2016-05-24 DIAGNOSIS — F321 Major depressive disorder, single episode, moderate: Secondary | ICD-10-CM | POA: Diagnosis not present

## 2016-05-24 DIAGNOSIS — I10 Essential (primary) hypertension: Secondary | ICD-10-CM | POA: Diagnosis not present

## 2016-05-24 DIAGNOSIS — K219 Gastro-esophageal reflux disease without esophagitis: Secondary | ICD-10-CM | POA: Diagnosis not present

## 2016-07-19 ENCOUNTER — Other Ambulatory Visit: Payer: Self-pay | Admitting: Cardiology

## 2016-08-27 ENCOUNTER — Other Ambulatory Visit: Payer: Self-pay | Admitting: Cardiology

## 2016-08-27 DIAGNOSIS — E119 Type 2 diabetes mellitus without complications: Secondary | ICD-10-CM | POA: Diagnosis not present

## 2016-08-27 DIAGNOSIS — C679 Malignant neoplasm of bladder, unspecified: Secondary | ICD-10-CM | POA: Diagnosis not present

## 2016-08-27 DIAGNOSIS — K219 Gastro-esophageal reflux disease without esophagitis: Secondary | ICD-10-CM | POA: Diagnosis not present

## 2016-08-27 DIAGNOSIS — I10 Essential (primary) hypertension: Secondary | ICD-10-CM | POA: Diagnosis not present

## 2016-08-27 NOTE — Telephone Encounter (Signed)
Rx(s) sent to pharmacy electronically.  

## 2016-10-15 ENCOUNTER — Ambulatory Visit: Payer: Medicare Other | Admitting: Cardiology

## 2016-10-25 ENCOUNTER — Encounter: Payer: Self-pay | Admitting: Cardiology

## 2016-10-25 ENCOUNTER — Ambulatory Visit (INDEPENDENT_AMBULATORY_CARE_PROVIDER_SITE_OTHER): Payer: Medicare Other | Admitting: Cardiology

## 2016-10-25 VITALS — BP 122/72 | HR 83 | Ht 64.0 in | Wt 154.0 lb

## 2016-10-25 DIAGNOSIS — R002 Palpitations: Secondary | ICD-10-CM | POA: Diagnosis not present

## 2016-10-25 DIAGNOSIS — I10 Essential (primary) hypertension: Secondary | ICD-10-CM

## 2016-10-25 MED ORDER — METOPROLOL TARTRATE 25 MG PO TABS: 37.5000 mg | ORAL_TABLET | Freq: Two times a day (BID) | ORAL | 3 refills | Status: DC

## 2016-10-25 NOTE — Patient Instructions (Signed)
Medication Instructions:  INCREASE LOPRESSOR TO 37.5 MG TWO TIMES DAILY ( 1 1/2 PILLS TWO TIMES DAILY )   Labwork: NONE  Testing/Procedures: NONE  Follow-Up: Your physician recommends that you schedule a follow-up appointment in: 3 MONTHS    Any Other Special Instructions Will Be Listed Below (If Applicable).     If you need a refill on your cardiac medications before your next appointment, please call your pharmacy.

## 2016-10-25 NOTE — Progress Notes (Signed)
Clinical Summary Ms. Dahle is a 71 y.o.female last seen by Dr Percival Spanish, this is our first visit together.   1. HTN - intolerant to ACE-I due to tongue swelling - hydralazine makes her legs feel weak, remains on current dose. She thought metoprolol caused bruising in the past, thought tolerating currently.  - home bp's 3 times a week. Typically 140s/80.     2. Palpitations - last visit with Dr Percival Spanish started on low dose lopressor 25mg  bid.  - 02/2016 holter no arrhythmias - normal TSH last year  still with significant palpitations.     Past Medical History:  Diagnosis Date  . DDD (degenerative disc disease) CERVICAL AND LUMBAR  . Diabetes mellitus   . Hematuria   . History of bladder cancer 12/25/2012  . Hyperlipidemia   . Hypertension   . IBD (inflammatory bowel disease)   . Prolapse of vaginal vault after hysterectomy 12/25/2012  . Urgency of urination      Allergies  Allergen Reactions  . Lisinopril Swelling  . Ceftin [Cefuroxime Axetil] Rash     Current Outpatient Prescriptions  Medication Sig Dispense Refill  . albuterol (PROVENTIL HFA;VENTOLIN HFA) 108 (90 Base) MCG/ACT inhaler Inhale 2 puffs into the lungs every 6 (six) hours as needed for wheezing or shortness of breath.    Marland Kitchen amLODipine (NORVASC) 10 MG tablet Take 10 mg by mouth daily with breakfast.    . aspirin EC 81 MG tablet 81 mg. Take 81 mg by mouth nightly.    . chlorthalidone (HYGROTON) 25 MG tablet TAKE (1) TABLET BY MOUTH AT BEDTIME. 90 tablet 3  . hydrALAZINE (APRESOLINE) 50 MG tablet Take 1 tablet (50 mg total) by mouth 3 (three) times daily. 270 tablet 3  . loratadine (CLARITIN) 10 MG tablet Take 10 mg by mouth as needed.     Marland Kitchen LORazepam (ATIVAN) 1 MG tablet Take 1 mg by mouth every 8 (eight) hours as needed for anxiety.    . Mesalamine (ASACOL HD) 800 MG TBEC Take 1 tablet by mouth 2 (two) times daily.    . metFORMIN (GLUCOPHAGE-XR) 500 MG 24 hr tablet Take 500 mg by mouth every  evening.     . metoprolol tartrate (LOPRESSOR) 25 MG tablet TAKE (1) TABLET BY MOUTH TWICE DAILY. 180 tablet 0  . Multiple Vitamins-Minerals (CENTRUM SILVER PO) Take 1 tablet by mouth daily.     . pantoprazole (PROTONIX) 20 MG tablet Take 40 mg by mouth daily.     . potassium chloride SA (K-DUR,KLOR-CON) 20 MEQ tablet 20 mEq. Take 20 mEq by mouth every morning.    . pravastatin (PRAVACHOL) 40 MG tablet Take 40 mg by mouth every morning.      No current facility-administered medications for this visit.    Facility-Administered Medications Ordered in Other Visits  Medication Dose Route Frequency Provider Last Rate Last Dose  . mitomycin (MUTAMYCIN) chemo injection 40 mg  40 mg Bladder Instillation Once Kathie Rhodes, MD         Past Surgical History:  Procedure Laterality Date  . ABDOMINAL HYSTERECTOMY    . BREAST BIOPSY  03-20-2011  DR STRECK   BENIGN LEFT BREAST MASS  . BUNIONECTOMY  1980'S  . CYSTOSCOPY/RETROGRADE/URETEROSCOPY  05/29/2012   Procedure: CYSTOSCOPY/RETROGRADE/URETEROSCOPY;  Surgeon: Claybon Jabs, MD;  Location[facility];  Service: Urology;  Laterality: Right;   right  Retrograde Pyelogram   . HYSTEROSCOPY W/D&C  04/17/2012   Procedure: DILATATION AND CURETTAGE /HYSTEROSCOPY;  Surgeon:  Jonnie Kind, MD;  Location: [facility];  Service: Gynecology;  Laterality: N/A;  POLYPECTOMY  . TRANSURETHRAL RESECTION OF BLADDER TUMOR  05/29/2012   Procedure: TRANSURETHRAL RESECTION OF BLADDER TUMOR (TURBT);  Surgeon: Claybon Jabs, MD;  Location: [facility];  Service: Urology;  Laterality: N/A;  [place]  . TUBAL LIGATION  1984     Allergies  Allergen Reactions  . Lisinopril Swelling  . Ceftin [Cefuroxime Axetil] Rash      Family History  Problem Relation Age of Onset  . Heart disease Mother     Heart attack and stroke age 69  . COPD Father   . Cancer Sister     breast   . Diabetes Brother   . Cancer Brother     prostate  . Heart attack  Sister     22  . Heart disease Sister 66    Defibrillator     Social History Ms. Falin reports that she quit smoking about 28 years ago. Her smoking use included Cigarettes. She quit after 10.00 years of use. She has never used smokeless tobacco. Ms. Encinas reports that she does not drink alcohol.   Review of Systems CONSTITUTIONAL: No weight loss, fever, chills, weakness or fatigue.  HEENT: Eyes: No visual loss, blurred vision, double vision or yellow sclerae.No hearing loss, sneezing, congestion, runny nose or sore throat.  SKIN: No rash or itching.  CARDIOVASCULAR: per hpi RESPIRATORY: No shortness of breath, cough or sputum.  GASTROINTESTINAL: No anorexia, nausea, vomiting or diarrhea. No abdominal pain or blood.  GENITOURINARY: No burning on urination, no polyuria NEUROLOGICAL: No headache, dizziness, syncope, paralysis, ataxia, numbness or tingling in the extremities. No change in bowel or bladder control.  MUSCULOSKELETAL: No muscle, back pain, joint pain or stiffness.  LYMPHATICS: No enlarged nodes. No history of splenectomy.  PSYCHIATRIC: No history of depression or anxiety.  ENDOCRINOLOGIC: No reports of sweating, cold or heat intolerance. No polyuria or polydipsia.  Marland Kitchen   Physical Examination Vitals:   10/25/16 1307  BP: 122/72  Pulse: 83   Vitals:   10/25/16 1307  Weight: 154 lb (69.9 kg)  Height: 5\' 4"  (1.626 m)    Gen: resting comfortably, no acute distress HEENT: no scleral icterus, pupils equal round and reactive, no palptable cervical adenopathy,  CV: RRR, no m/r/g, no jvd Resp: Clear to auscultation bilaterally GI: abdomen is soft, non-tender, non-distended, normal bowel sounds, no hepatosplenomegaly MSK: extremities are warm, no edema.  Skin: warm, no rash Neuro:  no focal deficits Psych: appropriate affect   Diagnostic Studies  02/2016 holter  Rhythm throughout study is normal sinus rhythm  No supraventricular ectopy, rare ventricular  ectopy.  Min HR 61, Max HR 98, Avg HR 72  Reported symptoms of palpitations correlated with normal sinus rhythm  12/2015 Carotid US IMPRESSION: 1. Mild bilateral carotid bifurcation and proximal ICA plaque, resulting in less than 50% diameter stenosis. The exam does not exclude plaque ulceration or embolization. Continued surveillance recommended. 2.  Antegrade bilateral vertebral arterial flow.    Assessment and Plan   1. HTN - above based on home numbers given her DM2. We will increase lopressor to 37.5mg  bid - side effects to medications in the past, make gradual changes over time as needed, no rapid dose increases.  2. Palpitaitons - still with symptoms, we will increase lopressor       Arnoldo Lenis, M.D.

## 2016-11-21 DIAGNOSIS — I10 Essential (primary) hypertension: Secondary | ICD-10-CM | POA: Diagnosis not present

## 2016-11-21 DIAGNOSIS — K210 Gastro-esophageal reflux disease with esophagitis: Secondary | ICD-10-CM | POA: Diagnosis not present

## 2016-11-21 DIAGNOSIS — E119 Type 2 diabetes mellitus without complications: Secondary | ICD-10-CM | POA: Diagnosis not present

## 2016-11-21 DIAGNOSIS — J309 Allergic rhinitis, unspecified: Secondary | ICD-10-CM | POA: Diagnosis not present

## 2017-01-06 ENCOUNTER — Other Ambulatory Visit (HOSPITAL_COMMUNITY): Payer: Self-pay | Admitting: Pulmonary Disease

## 2017-01-06 DIAGNOSIS — Z1231 Encounter for screening mammogram for malignant neoplasm of breast: Secondary | ICD-10-CM

## 2017-01-24 DIAGNOSIS — E119 Type 2 diabetes mellitus without complications: Secondary | ICD-10-CM | POA: Diagnosis not present

## 2017-01-24 DIAGNOSIS — K210 Gastro-esophageal reflux disease with esophagitis: Secondary | ICD-10-CM | POA: Diagnosis not present

## 2017-01-24 DIAGNOSIS — I10 Essential (primary) hypertension: Secondary | ICD-10-CM | POA: Diagnosis not present

## 2017-01-24 DIAGNOSIS — J309 Allergic rhinitis, unspecified: Secondary | ICD-10-CM | POA: Diagnosis not present

## 2017-01-28 ENCOUNTER — Encounter: Payer: Self-pay | Admitting: Cardiology

## 2017-01-28 ENCOUNTER — Ambulatory Visit (INDEPENDENT_AMBULATORY_CARE_PROVIDER_SITE_OTHER): Payer: Medicare Other | Admitting: Cardiology

## 2017-01-28 VITALS — BP 132/74 | HR 78 | Ht 64.0 in | Wt 156.0 lb

## 2017-01-28 DIAGNOSIS — R002 Palpitations: Secondary | ICD-10-CM

## 2017-01-28 DIAGNOSIS — I10 Essential (primary) hypertension: Secondary | ICD-10-CM | POA: Diagnosis not present

## 2017-01-28 NOTE — Patient Instructions (Signed)
Your physician wants you to follow-up in:6 months  with Dr.Branch You will receive a reminder letter in the mail two months in advance. If you don't receive a letter, please call our office to schedule the follow-up appointment.   Your physician recommends that you continue on your current medications as directed. Please refer to the Current Medication list given to you today.    If you need a refill on your cardiac medications before your next appointment, please call your pharmacy.      No lab work or testing ordered today      Thank you for choosing [facility] !        

## 2017-01-28 NOTE — Progress Notes (Signed)
Clinical Summary April Gomez is a 71 y.o.female seen today for follow up of the following medical problem.s   1. HTN - intolerant to ACE-I due to tongue swelling - hydralazine makes her legs feel weak, remains on current dose. She thought metoprolol caused bruising in the past, though tolerating currently.   - home bp's 130s/60s.   2. Palpitations - 02/2016 holter no arrhythmias - normal TSH last year    - last visit we increased lopressor to 37.5mg  bid. Symptoms have improved.    Past Medical History:  Diagnosis Date  . DDD (degenerative disc disease) CERVICAL AND LUMBAR  . Diabetes mellitus   . Hematuria   . History of bladder cancer 12/25/2012  . Hyperlipidemia   . Hypertension   . IBD (inflammatory bowel disease)   . Prolapse of vaginal vault after hysterectomy 12/25/2012  . Urgency of urination      Allergies  Allergen Reactions  . Lisinopril Swelling  . Ceftin [Cefuroxime Axetil] Rash     Current Outpatient Prescriptions  Medication Sig Dispense Refill  . albuterol (PROVENTIL HFA;VENTOLIN HFA) 108 (90 Base) MCG/ACT inhaler Inhale 2 puffs into the lungs every 6 (six) hours as needed for wheezing or shortness of breath.    Marland Kitchen amLODipine (NORVASC) 10 MG tablet Take 10 mg by mouth daily with breakfast.    . aspirin EC 81 MG tablet 81 mg. Take 81 mg by mouth nightly.    . chlorthalidone (HYGROTON) 25 MG tablet TAKE (1) TABLET BY MOUTH AT BEDTIME. 90 tablet 3  . hydrALAZINE (APRESOLINE) 50 MG tablet Take 1 tablet (50 mg total) by mouth 3 (three) times daily. 270 tablet 3  . loratadine (CLARITIN) 10 MG tablet Take 10 mg by mouth as needed.     Marland Kitchen LORazepam (ATIVAN) 1 MG tablet Take 1 mg by mouth every 8 (eight) hours as needed for anxiety.    . Mesalamine (ASACOL HD) 800 MG TBEC Take 1 tablet by mouth 2 (two) times daily.    . metFORMIN (GLUCOPHAGE-XR) 500 MG 24 hr tablet Take 500 mg by mouth every evening.     . metoprolol tartrate (LOPRESSOR) 25 MG tablet  Take 1.5 tablets (37.5 mg total) by mouth 2 (two) times daily. 135 tablet 3  . Multiple Vitamins-Minerals (CENTRUM SILVER PO) Take 1 tablet by mouth daily.     . pantoprazole (PROTONIX) 20 MG tablet Take 40 mg by mouth daily.     . potassium chloride SA (K-DUR,KLOR-CON) 20 MEQ tablet 20 mEq. Take 20 mEq by mouth every Gomez.    . pravastatin (PRAVACHOL) 40 MG tablet Take 40 mg by mouth every Gomez.      No current facility-administered medications for this visit.    Facility-Administered Medications Ordered in Other Visits  Medication Dose Route Frequency Provider Last Rate Last Dose  . mitomycin (MUTAMYCIN) chemo injection 40 mg  40 mg Bladder Instillation Once Kathie Rhodes, MD         Past Surgical History:  Procedure Laterality Date  . ABDOMINAL HYSTERECTOMY    . BREAST BIOPSY  03-20-2011  DR STRECK   BENIGN LEFT BREAST MASS  . BUNIONECTOMY  1980'S  . CYSTOSCOPY/RETROGRADE/URETEROSCOPY  05/29/2012   Procedure: CYSTOSCOPY/RETROGRADE/URETEROSCOPY;  Surgeon: Claybon Jabs, MD;  Location: [facility];  Service: Urology;  Laterality: Right;   right  Retrograde Pyelogram   . HYSTEROSCOPY W/D&C  04/17/2012   Procedure: DILATATION AND CURETTAGE /HYSTEROSCOPY;  Surgeon: Jonnie Kind, MD;  Location: [facility];  Service: Gynecology;  Laterality: N/A;  POLYPECTOMY  . TRANSURETHRAL RESECTION OF BLADDER TUMOR  05/29/2012   Procedure: TRANSURETHRAL RESECTION OF BLADDER TUMOR (TURBT);  Surgeon: Claybon Jabs, MD;  Location: [facility];  Service: Urology;  Laterality: N/A;  [place]  . TUBAL LIGATION  1984     Allergies  Allergen Reactions  . Lisinopril Swelling  . Ceftin [Cefuroxime Axetil] Rash      Family History  Problem Relation Age of Onset  . Heart disease Mother        Heart attack and stroke age 93  . COPD Father   . Cancer Sister        breast   . Diabetes Brother   . Cancer Brother        prostate  . Heart attack Sister        29  .  Heart disease Sister 70       Defibrillator     Social History Ms. Keeler reports that she quit smoking about 28 years ago. Her smoking use included Cigarettes. She quit after 10.00 years of use. She has never used smokeless tobacco. Ms. Ripoll reports that she does not drink alcohol.   Review of Systems CONSTITUTIONAL: No weight loss, fever, chills, weakness or fatigue.  HEENT: Eyes: No visual loss, blurred vision, double vision or yellow sclerae.No hearing loss, sneezing, congestion, runny nose or sore throat.  SKIN: No rash or itching.  CARDIOVASCULAR: pe rhpi RESPIRATORY: No shortness of breath, cough or sputum.  GASTROINTESTINAL: No anorexia, nausea, vomiting or diarrhea. No abdominal pain or blood.  GENITOURINARY: No burning on urination, no polyuria NEUROLOGICAL: No headache, dizziness, syncope, paralysis, ataxia, numbness or tingling in the extremities. No change in bowel or bladder control.  MUSCULOSKELETAL: No muscle, back pain, joint pain or stiffness.  LYMPHATICS: No enlarged nodes. No history of splenectomy.  PSYCHIATRIC: No history of depression or anxiety.  ENDOCRINOLOGIC: No reports of sweating, cold or heat intolerance. No polyuria or polydipsia.  Marland Kitchen   Physical Examination Vitals:   01/28/17 1120  BP: 132/74  Pulse: 78   Vitals:   01/28/17 1120  Weight: 156 lb (70.8 kg)  Height: 5\' 4"  (1.626 m)    Gen: resting comfortably, no acute distress HEENT: no scleral icterus, pupils equal round and reactive, no palptable cervical adenopathy,  CV: RRR, no m/r/g no jvd Resp: Clear to auscultation bilaterally GI: abdomen is soft, non-tender, non-distended, normal bowel sounds, no hepatosplenomegaly MSK: extremities are warm, no edema.  Skin: warm, no rash Neuro:  no focal deficits Psych: appropriate affect   Diagnostic Studies 02/2016 holter  Rhythm throughout study is normal sinus rhythm  No supraventricular ectopy, rare ventricular ectopy.  Min HR  61, Max HR 98, Avg HR 72  Reported symptoms of palpitations correlated with normal sinus rhythm  12/2015 Carotid US IMPRESSION: 1. Mild bilateral carotid bifurcation and proximal ICA plaque, resulting in less than 50% diameter stenosis. The exam does not exclude plaque ulceration or embolization. Continued surveillance recommended. 2. Antegrade bilateral vertebral arterial flow.    Assessment and Plan  1. HTN - at goal, continue current meds  2. Palpitaitons - doing well, continue current meds     Arnoldo Lenis, M.D.

## 2017-02-12 ENCOUNTER — Ambulatory Visit (HOSPITAL_COMMUNITY): Payer: Medicare Other

## 2017-02-14 ENCOUNTER — Ambulatory Visit (HOSPITAL_COMMUNITY)
Admission: RE | Admit: 2017-02-14 | Discharge: 2017-02-14 | Disposition: A | Discharge status: Home / Self Care | Payer: Medicare Other | Source: Ambulatory Visit | Attending: Pulmonary Disease | Admitting: Pulmonary Disease

## 2017-02-14 DIAGNOSIS — Z1231 Encounter for screening mammogram for malignant neoplasm of breast: Secondary | ICD-10-CM | POA: Insufficient documentation

## 2017-02-20 ENCOUNTER — Other Ambulatory Visit (HOSPITAL_COMMUNITY): Payer: Self-pay | Admitting: Pulmonary Disease

## 2017-02-20 DIAGNOSIS — Z780 Asymptomatic menopausal state: Secondary | ICD-10-CM

## 2017-02-20 DIAGNOSIS — Z0000 Encounter for general adult medical examination without abnormal findings: Secondary | ICD-10-CM | POA: Diagnosis not present

## 2017-02-25 ENCOUNTER — Ambulatory Visit (HOSPITAL_COMMUNITY)
Admission: RE | Admit: 2017-02-25 | Discharge: 2017-02-25 | Disposition: A | Discharge status: Home / Self Care | Payer: Medicare Other | Source: Ambulatory Visit | Attending: Pulmonary Disease | Admitting: Pulmonary Disease

## 2017-02-25 DIAGNOSIS — Z780 Asymptomatic menopausal state: Secondary | ICD-10-CM | POA: Diagnosis not present

## 2017-02-25 DIAGNOSIS — M85852 Other specified disorders of bone density and structure, left thigh: Secondary | ICD-10-CM | POA: Diagnosis not present

## 2017-03-13 DIAGNOSIS — Z1211 Encounter for screening for malignant neoplasm of colon: Secondary | ICD-10-CM | POA: Diagnosis not present

## 2017-03-27 DIAGNOSIS — C678 Malignant neoplasm of overlapping sites of bladder: Secondary | ICD-10-CM | POA: Diagnosis not present

## 2017-04-03 DIAGNOSIS — K435 Parastomal hernia without obstruction or  gangrene: Secondary | ICD-10-CM | POA: Diagnosis not present

## 2017-04-03 DIAGNOSIS — C678 Malignant neoplasm of overlapping sites of bladder: Secondary | ICD-10-CM | POA: Diagnosis not present

## 2017-04-03 DIAGNOSIS — Z9889 Other specified postprocedural states: Secondary | ICD-10-CM | POA: Diagnosis not present

## 2017-04-03 DIAGNOSIS — Z8551 Personal history of malignant neoplasm of bladder: Secondary | ICD-10-CM | POA: Diagnosis not present

## 2017-04-03 DIAGNOSIS — J439 Emphysema, unspecified: Secondary | ICD-10-CM | POA: Diagnosis not present

## 2017-04-10 DIAGNOSIS — C679 Malignant neoplasm of bladder, unspecified: Secondary | ICD-10-CM | POA: Diagnosis not present

## 2017-04-10 DIAGNOSIS — E119 Type 2 diabetes mellitus without complications: Secondary | ICD-10-CM | POA: Diagnosis not present

## 2017-04-10 DIAGNOSIS — I10 Essential (primary) hypertension: Secondary | ICD-10-CM | POA: Diagnosis not present

## 2017-04-17 ENCOUNTER — Other Ambulatory Visit: Payer: Self-pay | Admitting: Cardiology

## 2017-05-14 ENCOUNTER — Other Ambulatory Visit: Payer: Self-pay | Admitting: Cardiology

## 2017-05-26 DIAGNOSIS — I10 Essential (primary) hypertension: Secondary | ICD-10-CM | POA: Diagnosis not present

## 2017-05-26 DIAGNOSIS — C679 Malignant neoplasm of bladder, unspecified: Secondary | ICD-10-CM | POA: Diagnosis not present

## 2017-05-26 DIAGNOSIS — K219 Gastro-esophageal reflux disease without esophagitis: Secondary | ICD-10-CM | POA: Diagnosis not present

## 2017-05-26 DIAGNOSIS — K5090 Crohn's disease, unspecified, without complications: Secondary | ICD-10-CM | POA: Diagnosis not present

## 2017-06-25 ENCOUNTER — Other Ambulatory Visit: Payer: Self-pay | Admitting: Cardiology

## 2017-07-14 ENCOUNTER — Other Ambulatory Visit: Payer: Self-pay | Admitting: Cardiology

## 2017-08-05 ENCOUNTER — Ambulatory Visit (INDEPENDENT_AMBULATORY_CARE_PROVIDER_SITE_OTHER): Payer: Medicare Other | Admitting: Cardiology

## 2017-08-05 ENCOUNTER — Encounter: Payer: Self-pay | Admitting: Cardiology

## 2017-08-05 VITALS — BP 162/76 | HR 80 | Ht 64.0 in | Wt 162.0 lb

## 2017-08-05 DIAGNOSIS — R002 Palpitations: Secondary | ICD-10-CM

## 2017-08-05 DIAGNOSIS — I10 Essential (primary) hypertension: Secondary | ICD-10-CM | POA: Diagnosis not present

## 2017-08-05 NOTE — Patient Instructions (Addendum)

## 2017-08-05 NOTE — Progress Notes (Signed)
Clinical Summary Ms. Rettig is a 72 y.o.female seen today for follow up of the following medical problem.s   1. HTN - intolerant to ACE-I due to tongue swelling - hydralazine makes her legs feel weak, remains on current dose. She thought metoprolol causedbruising in the past, though tolerating currently.    - home bp's run around 130s/70s - compliant with meds  2. Palpitations - 02/2016 holter no arrhythmias - normal TSH last year   - mild infrequent palpitations, most recently when she had a cold.     Past Medical History:  Diagnosis Date  . DDD (degenerative disc disease) CERVICAL AND LUMBAR  . Diabetes mellitus   . Hematuria   . History of bladder cancer 12/25/2012  . Hyperlipidemia   . Hypertension   . IBD (inflammatory bowel disease)   . Prolapse of vaginal vault after hysterectomy 12/25/2012  . Urgency of urination      Allergies  Allergen Reactions  . Lisinopril Swelling  . Ceftin [Cefuroxime Axetil] Rash     Current Outpatient Medications  Medication Sig Dispense Refill  . albuterol (PROVENTIL HFA;VENTOLIN HFA) 108 (90 Base) MCG/ACT inhaler Inhale 2 puffs into the lungs every 6 (six) hours as needed for wheezing or shortness of breath.    Marland Kitchen amLODipine (NORVASC) 10 MG tablet Take 10 mg by mouth daily with breakfast.    . aspirin EC 81 MG tablet 81 mg. Take 81 mg by mouth nightly.    . chlorthalidone (HYGROTON) 25 MG tablet TAKE (1) TABLET BY MOUTH AT BEDTIME. 30 tablet 5  . hydrALAZINE (APRESOLINE) 50 MG tablet TAKE ONE TABLET BY MOUTH THREE TIMES DAILY. 270 tablet 3  . loratadine (CLARITIN) 10 MG tablet Take 10 mg by mouth as needed.     Marland Kitchen LORazepam (ATIVAN) 1 MG tablet Take 1 mg by mouth every 8 (eight) hours as needed for anxiety.    . Mesalamine (ASACOL HD) 800 MG TBEC Take 1 tablet by mouth 2 (two) times daily.    . metFORMIN (GLUCOPHAGE-XR) 500 MG 24 hr tablet Take 500 mg by mouth every evening.     . metoprolol tartrate (LOPRESSOR) 25 MG  tablet TAKE 1 and 1/2 TABLETS BY MOUTH TWICE DAILY. 135 tablet 0  . Multiple Vitamins-Minerals (CENTRUM SILVER PO) Take 1 tablet by mouth daily.     . pantoprazole (PROTONIX) 20 MG tablet Take 40 mg by mouth daily.     . potassium chloride SA (K-DUR,KLOR-CON) 20 MEQ tablet 20 mEq. Take 20 mEq by mouth every morning.    . pravastatin (PRAVACHOL) 40 MG tablet Take 40 mg by mouth every morning.      No current facility-administered medications for this visit.    Facility-Administered Medications Ordered in Other Visits  Medication Dose Route Frequency Provider Last Rate Last Dose  . mitomycin (MUTAMYCIN) chemo injection 40 mg  40 mg Bladder Instillation Once Kathie Rhodes, MD         Past Surgical History:  Procedure Laterality Date  . ABDOMINAL HYSTERECTOMY    . BREAST BIOPSY  03-20-2011  DR STRECK   BENIGN LEFT BREAST MASS  . BUNIONECTOMY  1980'S  . CYSTOSCOPY/RETROGRADE/URETEROSCOPY  05/29/2012   Procedure: CYSTOSCOPY/RETROGRADE/URETEROSCOPY;  Surgeon: Claybon Jabs, MD;  Location: [facility];  Service: Urology;  Laterality: Right;   right  Retrograde Pyelogram   . HYSTEROSCOPY W/D&C  04/17/2012   Procedure: DILATATION AND CURETTAGE /HYSTEROSCOPY;  Surgeon: Jonnie Kind, MD;  Location: [facility];  Service:  Gynecology;  Laterality: N/A;  POLYPECTOMY  . TRANSURETHRAL RESECTION OF BLADDER TUMOR  05/29/2012   Procedure: TRANSURETHRAL RESECTION OF BLADDER TUMOR (TURBT);  Surgeon: Claybon Jabs, MD;  Location: [facility];  Service: Urology;  Laterality: N/A;  [place]  . TUBAL LIGATION  1984     Allergies  Allergen Reactions  . Lisinopril Swelling  . Ceftin [Cefuroxime Axetil] Rash      Family History  Problem Relation Age of Onset  . Heart disease Mother        Heart attack and stroke age 67  . COPD Father   . Cancer Sister        breast   . Diabetes Brother   . Cancer Brother        prostate  . Heart attack Sister        26  . Heart disease  Sister 67       Defibrillator     Social History Ms. Styles reports that she quit smoking about 29 years ago. Her smoking use included cigarettes. She quit after 10.00 years of use. she has never used smokeless tobacco. Ms. Duford reports that she does not drink alcohol.   Review of Systems CONSTITUTIONAL: No weight loss, fever, chills, weakness or fatigue.  HEENT: Eyes: No visual loss, blurred vision, double vision or yellow sclerae.No hearing loss, sneezing, congestion, runny nose or sore throat.  SKIN: No rash or itching.  CARDIOVASCULAR: per hpi RESPIRATORY: No shortness of breath, cough or sputum.  GASTROINTESTINAL: No anorexia, nausea, vomiting or diarrhea. No abdominal pain or blood.  GENITOURINARY: No burning on urination, no polyuria NEUROLOGICAL: No headache, dizziness, syncope, paralysis, ataxia, numbness or tingling in the extremities. No change in bowel or bladder control.  MUSCULOSKELETAL: No muscle, back pain, joint pain or stiffness.  LYMPHATICS: No enlarged nodes. No history of splenectomy.  PSYCHIATRIC: No history of depression or anxiety.  ENDOCRINOLOGIC: No reports of sweating, cold or heat intolerance. No polyuria or polydipsia.  Marland Kitchen   Physical Examination Vitals:   08/05/17 1114  BP: (!) 162/76  Pulse: 80  SpO2: 95%   Vitals:   08/05/17 1114  Weight: 162 lb (73.5 kg)  Height: 5\' 4"  (1.626 m)    Gen: resting comfortably, no acute distress HEENT: no scleral icterus, pupils equal round and reactive, no palptable cervical adenopathy,  CV: RRR, no m/r/g, no jvd Resp: Clear to auscultation bilaterally GI: abdomen is soft, non-tender, non-distended, normal bowel sounds, no hepatosplenomegaly MSK: extremities are warm, no edema.  Skin: warm, no rash Neuro:  no focal deficits Psych: appropriate affect   Diagnostic Studies 02/2016 holter  Rhythm throughout study is normal sinus rhythm  No supraventricular ectopy, rare ventricular ectopy.  Min HR  61, Max HR 98, Avg HR 72  Reported symptoms of palpitations correlated with normal sinus rhythm  12/2015 Carotid US IMPRESSION: 1. Mild bilateral carotid bifurcation and proximal ICA plaque, resulting in less than 50% diameter stenosis. The exam does not exclude plaque ulceration or embolization. Continued surveillance recommended. 2. Antegrade bilateral vertebral arterial flow.      Assessment and Plan  1. HTN - elevated in clinic, home numbers at goal - continue current meds  2. Palpitaitons - overall controlled, continue current meds     F/u 1 year. Request pcp labs    Arnoldo Lenis, M.D.

## 2017-08-08 ENCOUNTER — Encounter: Payer: Self-pay | Admitting: Cardiology

## 2017-08-15 ENCOUNTER — Other Ambulatory Visit: Payer: Self-pay | Admitting: Cardiology

## 2017-08-26 DIAGNOSIS — K210 Gastro-esophageal reflux disease with esophagitis: Secondary | ICD-10-CM | POA: Diagnosis not present

## 2017-08-26 DIAGNOSIS — C679 Malignant neoplasm of bladder, unspecified: Secondary | ICD-10-CM | POA: Diagnosis not present

## 2017-08-26 DIAGNOSIS — E119 Type 2 diabetes mellitus without complications: Secondary | ICD-10-CM | POA: Diagnosis not present

## 2017-08-26 DIAGNOSIS — I10 Essential (primary) hypertension: Secondary | ICD-10-CM | POA: Diagnosis not present

## 2017-09-24 ENCOUNTER — Other Ambulatory Visit: Payer: Self-pay | Admitting: Cardiology

## 2017-09-30 ENCOUNTER — Other Ambulatory Visit: Payer: Self-pay

## 2017-09-30 ENCOUNTER — Other Ambulatory Visit (HOSPITAL_COMMUNITY)
Admission: RE | Admit: 2017-09-30 | Discharge: 2017-09-30 | Disposition: A | Discharge status: Home / Self Care | Payer: Medicare Other | Source: Ambulatory Visit | Attending: Emergency Medicine | Admitting: Emergency Medicine

## 2017-09-30 ENCOUNTER — Ambulatory Visit (INDEPENDENT_AMBULATORY_CARE_PROVIDER_SITE_OTHER): Payer: Medicare Other | Admitting: Physician Assistant

## 2017-09-30 ENCOUNTER — Ambulatory Visit (HOSPITAL_COMMUNITY)
Admission: RE | Admit: 2017-09-30 | Discharge: 2017-09-30 | Disposition: A | Discharge status: Home / Self Care | Payer: Medicare Other | Source: Ambulatory Visit | Attending: Physician Assistant | Admitting: Physician Assistant

## 2017-09-30 ENCOUNTER — Encounter: Payer: Self-pay | Admitting: Physician Assistant

## 2017-09-30 ENCOUNTER — Telehealth: Payer: Self-pay

## 2017-09-30 VITALS — BP 160/80 | HR 95 | Ht 65.5 in | Wt 162.0 lb

## 2017-09-30 DIAGNOSIS — R002 Palpitations: Secondary | ICD-10-CM | POA: Diagnosis not present

## 2017-09-30 DIAGNOSIS — I340 Nonrheumatic mitral (valve) insufficiency: Secondary | ICD-10-CM | POA: Diagnosis not present

## 2017-09-30 DIAGNOSIS — I498 Other specified cardiac arrhythmias: Secondary | ICD-10-CM | POA: Insufficient documentation

## 2017-09-30 DIAGNOSIS — I10 Essential (primary) hypertension: Secondary | ICD-10-CM | POA: Diagnosis not present

## 2017-09-30 DIAGNOSIS — I493 Ventricular premature depolarization: Secondary | ICD-10-CM | POA: Diagnosis not present

## 2017-09-30 DIAGNOSIS — R0602 Shortness of breath: Secondary | ICD-10-CM

## 2017-09-30 DIAGNOSIS — Z79899 Other long term (current) drug therapy: Secondary | ICD-10-CM

## 2017-09-30 LAB — TSH: TSH: 0.85 u[IU]/mL (ref 0.350–4.500)

## 2017-09-30 LAB — BASIC METABOLIC PANEL
Anion gap: 12 (ref 5–15)
BUN: 12 mg/dL (ref 6–20)
CHLORIDE: 97 mmol/L — AB (ref 101–111)
CO2: 29 mmol/L (ref 22–32)
CREATININE: 0.8 mg/dL (ref 0.44–1.00)
Calcium: 9.6 mg/dL (ref 8.9–10.3)
GFR calc Af Amer: >60 mL/min (ref >60)
GFR calc non Af Amer: >60 mL/min (ref >60)
GLUCOSE: 221 mg/dL — AB (ref 65–99)
POTASSIUM: 3.3 mmol/L — AB (ref 3.5–5.1)
SODIUM: 138 mmol/L (ref 135–145)

## 2017-09-30 LAB — BRAIN NATRIURETIC PEPTIDE: B Natriuretic Peptide: 136 pg/mL — ABNORMAL HIGH (ref 0.0–100.0)

## 2017-09-30 LAB — CBC WITH DIFFERENTIAL/PLATELET
BASOS PCT: 1 %
Basophils Absolute: 0.1 10*3/uL (ref 0.0–0.1)
EOS ABS: 0 10*3/uL (ref 0.0–0.7)
Eosinophils Relative: 0 %
HEMATOCRIT: 38.4 % (ref 36.0–46.0)
Hemoglobin: 12.1 g/dL (ref 12.0–15.0)
LYMPHS ABS: 1.5 10*3/uL (ref 0.7–4.0)
Lymphocytes Relative: 22 %
MCH: 28.9 pg (ref 26.0–34.0)
MCHC: 31.5 g/dL (ref 30.0–36.0)
MCV: 91.6 fL (ref 78.0–100.0)
MONOS PCT: 9 %
Monocytes Absolute: 0.6 10*3/uL (ref 0.1–1.0)
NEUTROS ABS: 4.8 10*3/uL (ref 1.7–7.7)
Neutrophils Relative %: 68 %
Platelets: 448 10*3/uL — ABNORMAL HIGH (ref 150–400)
RBC: 4.19 MIL/uL (ref 3.87–5.11)
RDW: 13.7 % (ref 11.5–15.5)
WBC: 7 10*3/uL (ref 4.0–10.5)

## 2017-09-30 LAB — MAGNESIUM: MAGNESIUM: 1.9 mg/dL (ref 1.7–2.4)

## 2017-09-30 MED ORDER — POTASSIUM CHLORIDE CRYS ER 20 MEQ PO TBCR: EXTENDED_RELEASE_TABLET | ORAL | 6 refills | Status: DC

## 2017-09-30 MED ORDER — METOPROLOL TARTRATE 50 MG PO TABS: 50.0000 mg | ORAL_TABLET | Freq: Two times a day (BID) | ORAL | 3 refills | Status: DC

## 2017-09-30 NOTE — Patient Instructions (Signed)
Medication Instructions:  Your physician has recommended you make the following change in your medication: Increase Metoprolol to 50 mg Two Times Daily    Labwork: Your physician recommends that you return for lab work today.    Testing/Procedures: Your physician has requested that you have an echocardiogram. Echocardiography is a painless test that uses sound waves to create images of your heart. It provides your doctor with information about the size and shape of your heart and how well your heart's chambers and valves are working. This procedure takes approximately one hour. There are no restrictions for this procedure.  Your physician has recommended that you wear a holter monitor. Holter monitors are medical devices that record the heart's electrical activity. Doctors most often use these monitors to diagnose arrhythmias. Arrhythmias are problems with the speed or rhythm of the heartbeat. The monitor is a small, portable device. You can wear one while you do your normal daily activities. This is usually used to diagnose what is causing palpitations/syncope (passing out).    Follow-Up: Your physician recommends that you schedule a follow-up appointment in: 1 Week    Any Other Special Instructions Will Be Listed Below (If Applicable).     If you need a refill on your cardiac medications before your next appointment, please call your pharmacy.  Thank you for choosing [facility]!

## 2017-09-30 NOTE — Progress Notes (Signed)
Cardiology Office Note    Date:  09/30/2017  ID:  April Gomez, DOB 12-21-1945, MRN [id number] PCP:  Sinda Du, MD  Cardiologist:  Dr. Harl Bowie  Chief Complaint: palpitations  History of Present Illness:  April Gomez is a 72 y.o. female with history of HTN, palpitations, HLD, bladder CA, IBD, DM, hematuria, borderline MVP/mild MR who presents for evaluation of palpitations. She has prior history of palpitations. Echo in 2014 showing moderate LVH, EF 60-65%, grade 1 DD, mild MVP, mild MR, mild LAE, increased L atrial pressure. Holter in 2017 showed rare PVCs, but symptoms of palpitations correlated with NSR. OVs with Dr. Harl Bowie in 2018 indicate still significant symptoms of palpitations prompting increase in Lopressor, which did improve symptoms. Last scanned labs from 12/2016 showed Hgb 11.7, plt 418, normal LFTs, LDL 59, TSH 1.4, glucose 127, K 3.7, Cr 0.83.  Her palpitations seem to flare up when she is sick with URI-type symptoms. She had a cold a week and a half ago, treated with prednisone, albuterol and antibiotic. She initially saw improvement but then on Friday began noticing intermittent palpitations. She finds them hard to describe and describes them at different ways in different details during the visit. At first she reported these as sensation of abrupt heart racing/fluttering lasting 10-15 minutes at a time but when we discussed this later in the visit she said they would never last that long. Sometimes she feels quick skip flip that causes her to cough but other times it feels like heart is racing. She did not feel this during the EKG but felt "something" shortly after leads were removed. Due to the sensation of needing to cough (dry/nonproductive), she started using her albuterol inhaler back but the palpitations have persisted. She coughed during the visit today but was still noticed to be in normal rhythm during auscultation. Over the weekend she noticed SOB and sweating  during the episodes of tachypalpitations, but did not feel any of this today with the heart racing that she felt earlier. She noticed this after taking Coricidin this weekend. She does not notice any SOB or diaphoresis with exertion or when the palpitations are not present. No chest pain, edema, orthopnea, weight gain, bleeding. BP elevated in clinic with HR 90s but the patient states she was rushing in here and did not take her medication until right before the visit - reports home BP [id number] systolic. She is comfortable at rest and denies worsening dyspnea walking into clinic today. She feels troubled that she cannot predict when her heart is going to act up. She cannot quantify how many episodes of heart racing she's had in the last 48 hours.   Past Medical History:  Diagnosis Date  . DDD (degenerative disc disease) CERVICAL AND LUMBAR  . Diabetes mellitus   . Hematuria   . History of bladder cancer 12/25/2012  . Hyperlipidemia   . Hypertension   . IBD (inflammatory bowel disease)   . Prolapse of vaginal vault after hysterectomy 12/25/2012  . PVC's (premature ventricular contractions)    a. Holter 2017: NSR, rare ventricular ectopy; palpitations correlated with NSR.  Marland Kitchen Urgency of urination     Past Surgical History:  Procedure Laterality Date  . ABDOMINAL HYSTERECTOMY    . BREAST BIOPSY  03-20-2011  DR STRECK   BENIGN LEFT BREAST MASS  . BUNIONECTOMY  1980'S  . CYSTOSCOPY/RETROGRADE/URETEROSCOPY  05/29/2012   Procedure: CYSTOSCOPY/RETROGRADE/URETEROSCOPY;  Surgeon: Claybon Jabs, MD;  Location: [facility];  Service:  Urology;  Laterality: Right;   right  Retrograde Pyelogram   . HYSTEROSCOPY W/D&C  04/17/2012   Procedure: DILATATION AND CURETTAGE /HYSTEROSCOPY;  Surgeon: Jonnie Kind, MD;  Location: [facility];  Service: Gynecology;  Laterality: N/A;  POLYPECTOMY  . TRANSURETHRAL RESECTION OF BLADDER TUMOR  05/29/2012   Procedure: TRANSURETHRAL RESECTION OF BLADDER TUMOR  (TURBT);  Surgeon: Claybon Jabs, MD;  Location: [facility];  Service: Urology;  Laterality: N/A;  [place]  . TUBAL LIGATION  1984    Current Medications: Current Meds  Medication Sig  . albuterol (PROVENTIL HFA;VENTOLIN HFA) 108 (90 Base) MCG/ACT inhaler Inhale 2 puffs into the lungs every 6 (six) hours as needed for wheezing or shortness of breath.  Marland Kitchen amLODipine (NORVASC) 10 MG tablet Take 10 mg by mouth daily with breakfast.  . aspirin EC 81 MG tablet 81 mg. Take 81 mg by mouth nightly.  . chlorthalidone (HYGROTON) 25 MG tablet TAKE (1) TABLET BY MOUTH AT BEDTIME.  . hydrALAZINE (APRESOLINE) 50 MG tablet TAKE ONE TABLET BY MOUTH THREE TIMES DAILY.  Marland Kitchen loratadine (CLARITIN) 10 MG tablet Take 10 mg by mouth as needed.   Marland Kitchen LORazepam (ATIVAN) 1 MG tablet Take 1 mg by mouth every 8 (eight) hours as needed for anxiety.  . Mesalamine (ASACOL HD) 800 MG TBEC Take 1 tablet by mouth 2 (two) times daily.  . metFORMIN (GLUCOPHAGE-XR) 500 MG 24 hr tablet Take 500 mg by mouth every evening.   . metoprolol tartrate (LOPRESSOR) 25 MG tablet TAKE 1 AND 1/2 TABLETS BY MOUTH TWICE DAILY.  . Multiple Vitamins-Minerals (CENTRUM SILVER PO) Take 1 tablet by mouth daily.   . pantoprazole (PROTONIX) 20 MG tablet Take 40 mg by mouth daily.   . potassium chloride SA (K-DUR,KLOR-CON) 20 MEQ tablet 20 mEq. Take 20 mEq by mouth every morning.  . pravastatin (PRAVACHOL) 40 MG tablet Take 40 mg by mouth every morning.     Allergies:   Lisinopril and Ceftin [cefuroxime axetil]   Social History   Socioeconomic History  . Marital status: Married    Spouse name: None  . Number of children: None  . Years of education: None  . Highest education level: None  Social Needs  . Financial resource strain: None  . Food insecurity - worry: None  . Food insecurity - inability: None  . Transportation needs - medical: None  . Transportation needs - non-medical: None  Occupational History  . None  Tobacco  Use  . Smoking status: Former Smoker    Years: 10.00    Types: Cigarettes    Last attempt to quit: 05/26/1988    Years since quitting: 29.3  . Smokeless tobacco: Never Used  Substance and Sexual Activity  . Alcohol use: No  . Drug use: No  . Sexual activity: No  Other Topics Concern  . None  Social History Narrative  . None     Family History:  Family History  Problem Relation Age of Onset  . Heart disease Mother        Heart attack and stroke age 23  . COPD Father   . Cancer Sister        breast   . Diabetes Brother   . Cancer Brother        prostate  . Heart attack Sister        68  . Heart disease Sister 53       Defibrillator   ROS:   Please see the  history of present illness.  All other systems are reviewed and otherwise negative.    PHYSICAL EXAM:   VS:  BP (!) 160/80 (BP Location: Right Arm) Comment: has just taken medication  Pulse 95   Ht 5' 5.5" (1.664 m)   Wt 162 lb (73.5 kg)   SpO2 98%   BMI 26.55 kg/m   BMI: Body mass index is 26.55 kg/m. GEN: Well nourished, well developed elderly AAF, in no acute distress  HEENT: normocephalic, atraumatic Neck: no JVD, carotid bruits, or masses Cardiac: RRR; no murmurs, rubs, or gallops, no edema  Respiratory: slightly diminished BS throughout; clear to auscultation bilaterally, normal work of breathing GI: soft, nontender, nondistended, + BS MS: no deformity or atrophy  Skin: warm and dry, no rash Neuro:  Alert and Oriented x 3, Strength and sensation are intact, follows commands Psych: euthymic mood, full affect  Wt Readings from Last 3 Encounters:  09/30/17 162 lb (73.5 kg)  08/05/17 162 lb (73.5 kg)  01/28/17 156 lb (70.8 kg)      Studies/Labs Reviewed:   EKG:  EKG was ordered today and personally reviewed by me and demonstrates NSR 95bpm, nonspecific ST-T changes diffusely, similar to prior tracings (primarily I, II, III, avF, V5-V6)  Recent Labs: No results found for requested labs within last  8760 hours.   Lipid Panel No results found for: CHOL, TRIG, HDL, CHOLHDL, VLDL, LDLCALC, LDLDIRECT  Additional studies/ records that were reviewed today include: Summarized above.    ASSESSMENT & PLAN:   1. Palpitations - several different descriptors and associated symptoms. I believe her skip/flips are likely due to PVCs but cannot explain the sensation of heart racing by them. This is vague in quality. Given recent frequency of symptoms, will plan repeat 48 hour monitor to exclude new arrhythmia. She reports being very sensitive to medicine and gets quite anxious when feeling her heart skip, so if event monitor is unrevealing, it may be that she is able to perceive sinus rhythm/borderline sinus tach as palpitations in the setting of recent cold medication. Will check basic labs to start, plan 48-hour monitor, and titrate metoprolol to 50mg  BID. Will also check echo as below to exclude any structural changes given mild MR/possible mitral valve prolapse in the past - EKG is noted to be chronically abnormal. She is clear that any SOB or sweating that she feels is in the context of heart racing only, and she has not had any chest pain. She is not tachypneic, hypoxic, and has no signs of swelling on exam. 2. PVCs - will quantify burden by event monitor. 3. HTN - will monitor with increase in beta blocker. She reports home BPs 130-140. Today's value is not helpful as she literally just took her medication right before arrival. Will also update TSH. 4. Mild MR - noted in 2014. Given palpitations and occasional associated dyspnea/diaphoresis, will update 2D echocardiogram.  Disposition: F/u with Dr. Christella Noa following above testing (~1 week).  Medication Adjustments/Labs and Tests Ordered: Current medicines are reviewed at length with the patient today.  Concerns regarding medicines are outlined above. Medication changes, Labs and Tests ordered today are summarized above and listed in the Patient  Instructions accessible in Encounters.   Signed, Charlie Pitter, PA-C  09/30/2017 11:09 AM    [facility] Location in [place], [place] [address] Ph: [phone]; Fax [phone]

## 2017-09-30 NOTE — Telephone Encounter (Signed)
-----   Message from Charlie Pitter, [place] sent at 09/30/2017  4:39 PM EST ----- Please call patient. BNP is mildly elevated, but not enough to really cause her symptoms (would not really be causing palpitations). Her potassium is quite low as it has been in the past. This could be contributing to palpitations. I would recommend she increase potassium to 41meq in the AM and 70meq in the PM. Please repeat BMET when she comes back in for echo. Platelet count also appears chronically mildly elevated and should be monitored by PCP if not already doing so.  Dayna Dunn PA-C

## 2017-09-30 NOTE — Telephone Encounter (Signed)
Pt will increase potassium to 40 meq am and 20 meq pm, bmet this Thursday, copied pcp

## 2017-10-02 ENCOUNTER — Telehealth: Payer: Self-pay | Admitting: *Deleted

## 2017-10-02 ENCOUNTER — Other Ambulatory Visit (HOSPITAL_COMMUNITY)
Admission: RE | Admit: 2017-10-02 | Discharge: 2017-10-02 | Disposition: A | Discharge status: Home / Self Care | Payer: Medicare Other | Source: Ambulatory Visit | Attending: Physician Assistant | Admitting: Physician Assistant

## 2017-10-02 ENCOUNTER — Ambulatory Visit (HOSPITAL_COMMUNITY)
Admission: RE | Admit: 2017-10-02 | Discharge: 2017-10-02 | Disposition: A | Discharge status: Home / Self Care | Payer: Medicare Other | Source: Ambulatory Visit | Attending: Physician Assistant | Admitting: Physician Assistant

## 2017-10-02 ENCOUNTER — Other Ambulatory Visit (HOSPITAL_COMMUNITY): Payer: Medicare Other

## 2017-10-02 DIAGNOSIS — Z79899 Other long term (current) drug therapy: Secondary | ICD-10-CM | POA: Diagnosis not present

## 2017-10-02 DIAGNOSIS — Z8551 Personal history of malignant neoplasm of bladder: Secondary | ICD-10-CM | POA: Insufficient documentation

## 2017-10-02 DIAGNOSIS — I119 Hypertensive heart disease without heart failure: Secondary | ICD-10-CM | POA: Diagnosis not present

## 2017-10-02 DIAGNOSIS — R002 Palpitations: Secondary | ICD-10-CM | POA: Diagnosis not present

## 2017-10-02 DIAGNOSIS — I071 Rheumatic tricuspid insufficiency: Secondary | ICD-10-CM | POA: Diagnosis not present

## 2017-10-02 DIAGNOSIS — I340 Nonrheumatic mitral (valve) insufficiency: Secondary | ICD-10-CM

## 2017-10-02 DIAGNOSIS — E119 Type 2 diabetes mellitus without complications: Secondary | ICD-10-CM | POA: Diagnosis not present

## 2017-10-02 LAB — BASIC METABOLIC PANEL
Anion gap: 9 (ref 5–15)
BUN: 21 mg/dL — ABNORMAL HIGH (ref 6–20)
CHLORIDE: 98 mmol/L — AB (ref 101–111)
CO2: 30 mmol/L (ref 22–32)
CREATININE: 0.94 mg/dL (ref 0.44–1.00)
Calcium: 9.9 mg/dL (ref 8.9–10.3)
GFR calc non Af Amer: 59 mL/min — ABNORMAL LOW (ref >60)
GLUCOSE: 136 mg/dL — AB (ref 65–99)
Potassium: 4 mmol/L (ref 3.5–5.1)
Sodium: 137 mmol/L (ref 135–145)

## 2017-10-02 NOTE — Progress Notes (Signed)
*  PRELIMINARY RESULTS* Echocardiogram 2D Echocardiogram has been performed.  April Gomez 10/02/2017, 1:33 PM

## 2017-10-02 NOTE — Telephone Encounter (Signed)
Called patient with test results. No answer. Left message to call back.  

## 2017-10-02 NOTE — Telephone Encounter (Signed)
-----   Message from Charlie Pitter, [place] sent at 10/02/2017  2:59 PM EST ----- Please let patient know echo overall looks OK with preserved heart pumping function. Some stiffening of heart muscle which was known from prior. No significant valve issues. Await monitor. Find out how pt is feeling. Dayna Dunn PA-C

## 2017-10-07 ENCOUNTER — Telehealth: Payer: Self-pay | Admitting: *Deleted

## 2017-10-07 NOTE — Progress Notes (Signed)
Cardiology Office Note    Date:  10/08/2017   ID:  April Gomez, DOB [birth year], MRN [id number]  PCP:  Sinda Du, MD  Cardiologist: Carlyle Dolly, MD    Chief Complaint  Patient presents with  . Follow-up    1 week visit    History of Present Illness:    April Gomez is a 72 y.o. female with past medical history of HTN, HLD, Type 2 DM, IBD, palpitations and mitral regurgitation who presents to the office today for 1-week follow-up.  She was recently evaluated by Melina Copa, PA-C on 09/30/2017 and reported having a recent cold within the past two weeks which was treated with steroids, albuterol, and antibiotics. During that timeframe, she experienced intermittent palpitations lasting for 10-15 minutes. Did experience associated dyspnea and diaphoresis at times. Lopressor was further titrated to 50mg  BID at that time along with an echocardiogram and Holter monitor being ordered. Her Holter monitor showed NSR with occasional PVC's (burden of 2.4%) and overall no significant arrhythmias. Echocardiogram showed a preserved EF of 60-65%, no regional WMA, Grade 1 DD, and trivial MR. Labwork was also obtained and showed a mild hypokalemia of 3.3 and potassium dosing was further adjusted. Mg and TSH were WNL.   In talking with the patient today, she reports significant improvement in her symptoms since her last office visit. Still notes occasional palpitations but these resolved spontaneously and only last for a few seconds at a time. She denies any recent chest pain, dyspnea on exertion, orthopnea, PND, or lower extremity edema.  She has been following her blood pressure at home and reports this has been well controlled. BP is at 138/72 during today's visit.   Past Medical History:  Diagnosis Date  . DDD (degenerative disc disease) CERVICAL AND LUMBAR  . Diabetes mellitus   . Hematuria   . History of bladder cancer 12/25/2012  . Hyperlipidemia   . Hypertension   . IBD  (inflammatory bowel disease)   . Prolapse of vaginal vault after hysterectomy 12/25/2012  . PVC's (premature ventricular contractions)    a. Holter 2017: NSR, rare ventricular ectopy; palpitations correlated with NSR.  Marland Kitchen Urgency of urination     Past Surgical History:  Procedure Laterality Date  . ABDOMINAL HYSTERECTOMY    . BREAST BIOPSY  03-20-2011  DR STRECK   BENIGN LEFT BREAST MASS  . BUNIONECTOMY  1980'S  . CYSTOSCOPY/RETROGRADE/URETEROSCOPY  05/29/2012   Procedure: CYSTOSCOPY/RETROGRADE/URETEROSCOPY;  Surgeon: Claybon Jabs, MD;  Location: [facility];  Service: Urology;  Laterality: Right;   right  Retrograde Pyelogram   . HYSTEROSCOPY W/D&C  04/17/2012   Procedure: DILATATION AND CURETTAGE /HYSTEROSCOPY;  Surgeon: Jonnie Kind, MD;  Location: [facility];  Service: Gynecology;  Laterality: N/A;  POLYPECTOMY  . TRANSURETHRAL RESECTION OF BLADDER TUMOR  05/29/2012   Procedure: TRANSURETHRAL RESECTION OF BLADDER TUMOR (TURBT);  Surgeon: Claybon Jabs, MD;  Location[facility];  Service: Urology;  Laterality: N/A;  [place]  . TUBAL LIGATION  1984    Current Medications: Outpatient Medications Prior to Visit  Medication Sig Dispense Refill  . albuterol (PROVENTIL HFA;VENTOLIN HFA) 108 (90 Base) MCG/ACT inhaler Inhale 2 puffs into the lungs every 6 (six) hours as needed for wheezing or shortness of breath.    Marland Kitchen amLODipine (NORVASC) 10 MG tablet Take 10 mg by mouth daily with breakfast.    . aspirin EC 81 MG tablet 81 mg. Take 81 mg by mouth nightly.    Marland Kitchen  chlorthalidone (HYGROTON) 25 MG tablet TAKE (1) TABLET BY MOUTH AT BEDTIME. 30 tablet 5  . hydrALAZINE (APRESOLINE) 50 MG tablet TAKE ONE TABLET BY MOUTH THREE TIMES DAILY. 270 tablet 3  . loratadine (CLARITIN) 10 MG tablet Take 10 mg by mouth as needed.     Marland Kitchen LORazepam (ATIVAN) 1 MG tablet Take 1 mg by mouth every 8 (eight) hours as needed for anxiety.    . Mesalamine (ASACOL HD) 800 MG TBEC Take 1  tablet by mouth 2 (two) times daily.    . metFORMIN (GLUCOPHAGE-XR) 500 MG 24 hr tablet Take 500 mg by mouth every evening.     . metoprolol tartrate (LOPRESSOR) 50 MG tablet Take 1 tablet (50 mg total) by mouth 2 (two) times daily. 180 tablet 3  . Multiple Vitamins-Minerals (CENTRUM SILVER PO) Take 1 tablet by mouth daily.     . pantoprazole (PROTONIX) 20 MG tablet Take 40 mg by mouth daily.     . potassium chloride SA (K-DUR,KLOR-CON) 20 MEQ tablet Take 40 meq ( 2 tablets) in the morning and 20 meq in the evening 90 tablet 6  . pravastatin (PRAVACHOL) 40 MG tablet Take 40 mg by mouth every morning.      Facility-Administered Medications Prior to Visit  Medication Dose Route Frequency Provider Last Rate Last Dose  . mitomycin (MUTAMYCIN) chemo injection 40 mg  40 mg Bladder Instillation Once Kathie Rhodes, MD         Allergies:   Lisinopril and Ceftin [cefuroxime axetil]   Social History   Socioeconomic History  . Marital status: Married    Spouse name: None  . Number of children: None  . Years of education: None  . Highest education level: None  Social Needs  . Financial resource strain: None  . Food insecurity - worry: None  . Food insecurity - inability: None  . Transportation needs - medical: None  . Transportation needs - non-medical: None  Occupational History  . None  Tobacco Use  . Smoking status: Former Smoker    Years: 10.00    Types: Cigarettes    Last attempt to quit: 05/26/1988    Years since quitting: 29.3  . Smokeless tobacco: Never Used  Substance and Sexual Activity  . Alcohol use: No  . Drug use: No  . Sexual activity: No  Other Topics Concern  . None  Social History Narrative  . None     Family History:  The patient's family history includes COPD in her father; Cancer in her brother and sister; Diabetes in her brother; Heart attack in her sister; Heart disease in her mother; Heart disease (age of onset: 53) in her sister.   Review of Systems:     Please see the history of present illness.     General:  No chills, fever, night sweats or weight changes.  Cardiovascular:  No chest pain, dyspnea on exertion, edema, orthopnea, paroxysmal nocturnal dyspnea. Positive for palpitations.  Dermatological: No rash, lesions/masses Respiratory: No cough, dyspnea Urologic: No hematuria, dysuria Abdominal:   No nausea, vomiting, diarrhea, bright red blood per rectum, melena, or hematemesis Neurologic:  No visual changes, wkns, changes in mental status.  All other systems reviewed and are otherwise negative except as noted above.   Physical Exam:    VS:  BP 138/72   Pulse 75   Ht 5' 5.5" (1.664 m)   Wt 164 lb (74.4 kg)   SpO2 97%   BMI 26.88 kg/m    General: Well  developed, well nourished [place] female appearing in no acute distress. Head: Normocephalic, atraumatic, sclera non-icteric, no xanthomas, nares are without discharge.  Neck: No carotid bruits. JVD not elevated.  Lungs: Respirations regular and unlabored, without wheezes or rales.  Heart: Regular rate and rhythm. No S3 or S4.  No murmur, no rubs, or gallops appreciated. Abdomen: Soft, non-tender, non-distended with normoactive bowel sounds. No hepatomegaly. No rebound/guarding. No obvious abdominal masses. Msk:  Strength and tone appear normal for age. No joint deformities or effusions. Extremities: No clubbing or cyanosis. No lower extremity edema.  Distal pedal pulses are 2+ bilaterally. Neuro: Alert and oriented X 3. Moves all extremities spontaneously. No focal deficits noted. Psych:  Responds to questions appropriately with a normal affect. Skin: No rashes or lesions noted  Wt Readings from Last 3 Encounters:  10/08/17 164 lb (74.4 kg)  09/30/17 162 lb (73.5 kg)  08/05/17 162 lb (73.5 kg)     Studies/Labs Reviewed:   EKG:  EKG is not ordered today.    Recent Labs: 09/30/2017: B Natriuretic Peptide 136.0; Hemoglobin 12.1; Magnesium 1.9; Platelets 448; TSH  0.850 10/02/2017: BUN 21; Creatinine, Ser 0.94; Potassium 4.0; Sodium 137   Lipid Panel No results found for: CHOL, TRIG, HDL, CHOLHDL, VLDL, LDLCALC, LDLDIRECT  Additional studies/ records that were reviewed today include:   Holter Monitor: 09/30/2017  Min HR 63, Max HR 101, Avg HR 77. Primary rhythm is sinus  No supraventricular ectopy  Rare ventricular ectopy, all in the form of isolated PVCs and couplets  Reported symptoms correlate with sinus rhythm with occasional PVCs  No significant sustained arrhythmias   Echocardiogram: 10/02/2017 Study Conclusions  - Left ventricle: The cavity size was normal. Wall thickness was   increased in a pattern of moderate LVH. Systolic function was   normal. The estimated ejection fraction was in the range of 60%   to 65%. Wall motion was normal; there were no regional wall   motion abnormalities. Doppler parameters are consistent with   abnormal left ventricular relaxation (grade 1 diastolic   dysfunction). - Aortic valve: Valve area (VTI): 2.23 cm^2. Valve area (Vmax):   1.82 cm^2. Valve area (Vmean): 2 cm^2. - Left atrium: The atrium was moderately dilated. - Right atrium: The atrium was mildly dilated. - Atrial septum: No defect or patent foramen ovale was identified. - Pulmonary arteries: Systolic pressure was mildly increased. PA   peak pressure: 37 mm Hg (S). - Technically adequate study.   Assessment:    1. Palpitations   2. PVC's (premature ventricular contractions)   3. Essential hypertension   4. Dyslipidemia   5. Mitral valve insufficiency, unspecified etiology      Plan:   In order of problems listed above:  1. Palpitations/ PVC's - recently evaluated for new-onset palpitations occurring after being treated for a cold with steroids, albuterol, and antibiotics. Holter monitor was obtained and showed NSR with occasional PVC's (burden of 2.4%) and overall no significant arrhythmias. Echocardiogram showed an 60-65%, no  regional WMA, Grade 1 DD, and trivial MR. Studies were reviewed in detail at her visit today. Was found to be mildly hypokalemic with K+ of 3.3 and K-dur dosing was appropriately adjusted.  - overall, her symptoms have significantly improved. Notes occasional palpitations which spontaneously resolve within a few seconds. Recent PVC's likely triggered by Albuterol use and hypokalemia. Recommended limiting caffeine intake. Does not consume alcohol. Will continue on Lopressor 50mg  BID.   2. HTN - BP is well-controlled at 138/72 during  today's visit. - continue Amlodipine 10mg  daily, Chlorthalidone 25mg  daily, Hydralazine 50mg  TID, and Lopressor 50mg  BID.   3. HLD - Lipid Panel in 12/2016 showed total cholesterol of 148, HDL 63, and LDL 59.  - followed by PCP. Remains on Pravastatin 40mg  daily.   4. Mitral Regurgitation - mild by prior echo in 2014. Most recent imaging shows this is trivial.     Medication Adjustments/Labs and Tests Ordered: Current medicines are reviewed at length with the patient today.  Concerns regarding medicines are outlined above.  Medication changes, Labs and Tests ordered today are listed in the Patient Instructions below. Patient Instructions  Medication Instructions:  Your physician recommends that you continue on your current medications as directed. Please refer to the Current Medication list given to you today.   Labwork: NONE   Testing/Procedures: NONE   Follow-Up: Your physician recommends that you schedule a follow-up appointment in: 3-4 Months with Dr. Harl Bowie    Any Other Special Instructions Will Be Listed Below (If Applicable).     If you need a refill on your cardiac medications before your next appointment, please call your pharmacy. Thank you for choosing [facility]!    Premature Ventricular Contraction A premature ventricular contraction (PVC) is a common irregularity in the normal heart rhythm. These contractions are extra  heartbeats that start in the heart ventricles and occur too early in the normal sequence. During the PVC, the heart's normal electrical pathway is not used, so the beat is shorter and less effective. In most cases, these contractions come and go and do not require treatment. What are the causes? In many cases, the cause may not be known. Common causes of the condition include:  Smoking.  Drinking alcohol.  Caffeine.  Certain medicines.  Some illegal drugs.  Stress.  Certain medical conditions can also cause PVCs:  Changes in minerals in the blood (electrolytes).  Heart failure.  Heart valve problems.  Low blood oxygen levels or high carbon dioxide levels.  Heart attack, or coronary artery disease.  What are the signs or symptoms? The main symptom of this condition is a fast or skipped heartbeat (palpitations). Other symptoms include:  Chest pain.  Shortness of breath.  Feeling tired.  Dizziness.  In some cases, there are no symptoms. How is this diagnosed? This condition may be diagnosed based on:  Your medical history.  A physical exam. During the exam, the health care provider will check for irregular heartbeats.  Tests, such as: ? An ECG (electrocardiogram) to monitor the electrical activity of your heart. ? Holter monitor testing. This involves wearing a device that clips to your clothing and monitors the electrical activity of your heart over longer periods of time. ? Stress tests to see how exercise affects your heart rhythm and blood supply. ? Echocardiogram. This test uses sound waves (ultrasound) to produce an image of your heart. ? Electrophysiology study. This test checks the electric pathways in your heart.  How is this treated? Treatment depends on any underlying conditions, the type of PVCs that you are having, and how much the symptoms are interfering with your daily life. Possible treatments include:  Avoiding things that can trigger the  premature contractions, such as caffeine or alcohol.  Medicines. These may be given if symptoms are severe or if the extra heartbeats are frequent.  Treatment for any underlying condition that is found to be the cause of the contractions.  Catheter ablation. This procedure destroys the heart tissues that send abnormal  signals.  In some cases, no treatment is required. Follow these instructions at home: Lifestyle Follow these instructions as told by your health care provider:  Do not use any products that contain nicotine or tobacco, such as cigarettes and e-cigarettes. If you need help quitting, ask your health care provider.  If caffeine triggers episodes of PVC, do not eat, drink, or use anything with caffeine in it.  If caffeine does not seem to trigger episodes, consume caffeine in moderation.  If alcohol triggers episodes of PVC, do not drink alcohol.  If alcohol does not seem to trigger episodes, limit alcohol intake to no more than 1 drink a day for nonpregnant women and 2 drinks a day for men. One drink equals 12 oz of beer, 5 oz of wine, or 1 oz of hard liquor.  Exercise regularly. Ask your health care provider what type of exercise is safe for you.  Find healthy ways to manage stress. Avoid stressful situations when possible.  Try to get at least 7-9 hours of sleep each night, or as much as recommended by your health care provider.  Do not use illegal drugs.  General instructions  Take over-the-counter and prescription medicines only as told by your health care provider.  Keep all follow-up visits as told by your health care provider. This is important. Get help right away if:  You feel palpitations that are frequent or continual.  You have chest pain.  You have shortness of breath.  You have sweating for no reason.  You have nausea and vomiting.  You become light-headed or you faint. This information is not intended to replace advice given to you by your  health care provider. Make sure you discuss any questions you have with your health care provider. Document Released: 03/01/2004 Document Revised: 03/08/2016 Document Reviewed: 12/20/2015 Elsevier Interactive Patient Education  2018 [place], Erma Heritage, [place]  10/08/2017 12:59 PM    [facility]. [address] [place], [place] [address] Phone: [phone]

## 2017-10-07 NOTE — Telephone Encounter (Signed)
Called patient with test results. No answer. Unable to leave message to call back.  

## 2017-10-07 NOTE — Telephone Encounter (Signed)
-----   Message from Charlie Pitter, [place] sent at 10/06/2017  5:03 PM EDT ----- Please let patient know event monitor showed NSR with PVCs, which are causing her symptoms of palpitations. Given that her EF is normal and the burden is fairly low I do not think these pose a danger to her but can certainly be unnerving. This burden was fairly low, 2.4%. Keep f/u as planned to revisit symptoms.  Dayna Dunn PA-C

## 2017-10-08 ENCOUNTER — Ambulatory Visit (INDEPENDENT_AMBULATORY_CARE_PROVIDER_SITE_OTHER): Payer: Medicare Other | Admitting: Student

## 2017-10-08 ENCOUNTER — Encounter: Payer: Self-pay | Admitting: Student

## 2017-10-08 VITALS — BP 138/72 | HR 75 | Ht 65.5 in | Wt 164.0 lb

## 2017-10-08 DIAGNOSIS — R002 Palpitations: Secondary | ICD-10-CM | POA: Diagnosis not present

## 2017-10-08 DIAGNOSIS — E785 Hyperlipidemia, unspecified: Secondary | ICD-10-CM

## 2017-10-08 DIAGNOSIS — I340 Nonrheumatic mitral (valve) insufficiency: Secondary | ICD-10-CM | POA: Diagnosis not present

## 2017-10-08 DIAGNOSIS — I493 Ventricular premature depolarization: Secondary | ICD-10-CM | POA: Diagnosis not present

## 2017-10-08 DIAGNOSIS — I10 Essential (primary) hypertension: Secondary | ICD-10-CM

## 2017-10-08 NOTE — Patient Instructions (Addendum)
Medication Instructions:  Your physician recommends that you continue on your current medications as directed. Please refer to the Current Medication list given to you today.   Labwork: NONE   Testing/Procedures: NONE   Follow-Up: Your physician recommends that you schedule a follow-up appointment in: 3-4 Months with Dr. Harl Bowie    Any Other Special Instructions Will Be Listed Below (If Applicable).     If you need a refill on your cardiac medications before your next appointment, please call your pharmacy. Thank you for choosing [facility]!    Premature Ventricular Contraction A premature ventricular contraction (PVC) is a common irregularity in the normal heart rhythm. These contractions are extra heartbeats that start in the heart ventricles and occur too early in the normal sequence. During the PVC, the heart's normal electrical pathway is not used, so the beat is shorter and less effective. In most cases, these contractions come and go and do not require treatment. What are the causes? In many cases, the cause may not be known. Common causes of the condition include:  Smoking.  Drinking alcohol.  Caffeine.  Certain medicines.  Some illegal drugs.  Stress.  Certain medical conditions can also cause PVCs:  Changes in minerals in the blood (electrolytes).  Heart failure.  Heart valve problems.  Low blood oxygen levels or high carbon dioxide levels.  Heart attack, or coronary artery disease.  What are the signs or symptoms? The main symptom of this condition is a fast or skipped heartbeat (palpitations). Other symptoms include:  Chest pain.  Shortness of breath.  Feeling tired.  Dizziness.  In some cases, there are no symptoms. How is this diagnosed? This condition may be diagnosed based on:  Your medical history.  A physical exam. During the exam, the health care provider will check for irregular heartbeats.  Tests, such as: ? An  ECG (electrocardiogram) to monitor the electrical activity of your heart. ? Holter monitor testing. This involves wearing a device that clips to your clothing and monitors the electrical activity of your heart over longer periods of time. ? Stress tests to see how exercise affects your heart rhythm and blood supply. ? Echocardiogram. This test uses sound waves (ultrasound) to produce an image of your heart. ? Electrophysiology study. This test checks the electric pathways in your heart.  How is this treated? Treatment depends on any underlying conditions, the type of PVCs that you are having, and how much the symptoms are interfering with your daily life. Possible treatments include:  Avoiding things that can trigger the premature contractions, such as caffeine or alcohol.  Medicines. These may be given if symptoms are severe or if the extra heartbeats are frequent.  Treatment for any underlying condition that is found to be the cause of the contractions.  Catheter ablation. This procedure destroys the heart tissues that send abnormal signals.  In some cases, no treatment is required. Follow these instructions at home: Lifestyle Follow these instructions as told by your health care provider:  Do not use any products that contain nicotine or tobacco, such as cigarettes and e-cigarettes. If you need help quitting, ask your health care provider.  If caffeine triggers episodes of PVC, do not eat, drink, or use anything with caffeine in it.  If caffeine does not seem to trigger episodes, consume caffeine in moderation.  If alcohol triggers episodes of PVC, do not drink alcohol.  If alcohol does not seem to trigger episodes, limit alcohol intake to no more than 1  drink a day for nonpregnant women and 2 drinks a day for men. One drink equals 12 oz of beer, 5 oz of wine, or 1 oz of hard liquor.  Exercise regularly. Ask your health care provider what type of exercise is safe for you.  Find  healthy ways to manage stress. Avoid stressful situations when possible.  Try to get at least 7-9 hours of sleep each night, or as much as recommended by your health care provider.  Do not use illegal drugs.  General instructions  Take over-the-counter and prescription medicines only as told by your health care provider.  Keep all follow-up visits as told by your health care provider. This is important. Get help right away if:  You feel palpitations that are frequent or continual.  You have chest pain.  You have shortness of breath.  You have sweating for no reason.  You have nausea and vomiting.  You become light-headed or you faint. This information is not intended to replace advice given to you by your health care provider. Make sure you discuss any questions you have with your health care provider. Document Released: 03/01/2004 Document Revised: 03/08/2016 Document Reviewed: 12/20/2015 Elsevier Interactive Patient Education  [employer].

## 2017-10-23 DIAGNOSIS — J410 Simple chronic bronchitis: Secondary | ICD-10-CM | POA: Diagnosis not present

## 2017-10-23 DIAGNOSIS — E11630 Type 2 diabetes mellitus with periodontal disease: Secondary | ICD-10-CM | POA: Diagnosis not present

## 2017-10-23 DIAGNOSIS — I10 Essential (primary) hypertension: Secondary | ICD-10-CM | POA: Diagnosis not present

## 2017-10-23 DIAGNOSIS — E876 Hypokalemia: Secondary | ICD-10-CM | POA: Diagnosis not present

## 2017-11-03 ENCOUNTER — Other Ambulatory Visit: Payer: Self-pay | Admitting: Physician Assistant

## 2017-11-03 MED ORDER — METOPROLOL TARTRATE 50 MG PO TABS: 50.0000 mg | ORAL_TABLET | Freq: Two times a day (BID) | ORAL | 11 refills | Status: DC

## 2017-11-03 NOTE — Telephone Encounter (Signed)
Needs new RX for new dosage of Metoprolol (if she is to stay on new dosage) sent to [facility]. / tg

## 2017-11-03 NOTE — Telephone Encounter (Signed)
Correct pharmacy is [facility] metoprolol e-scribed there,pt only wanted 30 day supply

## 2017-11-25 ENCOUNTER — Other Ambulatory Visit (HOSPITAL_COMMUNITY): Payer: Self-pay | Admitting: Respiratory Therapy

## 2017-11-25 DIAGNOSIS — J410 Simple chronic bronchitis: Secondary | ICD-10-CM | POA: Diagnosis not present

## 2017-11-25 DIAGNOSIS — R0602 Shortness of breath: Secondary | ICD-10-CM

## 2017-11-25 DIAGNOSIS — I10 Essential (primary) hypertension: Secondary | ICD-10-CM | POA: Diagnosis not present

## 2017-11-25 DIAGNOSIS — E870 Hyperosmolality and hypernatremia: Secondary | ICD-10-CM | POA: Diagnosis not present

## 2017-11-25 DIAGNOSIS — J301 Allergic rhinitis due to pollen: Secondary | ICD-10-CM | POA: Diagnosis not present

## 2017-12-02 ENCOUNTER — Ambulatory Visit (HOSPITAL_COMMUNITY)
Admission: RE | Admit: 2017-12-02 | Discharge: 2017-12-02 | Disposition: A | Discharge status: Home / Self Care | Payer: Medicare Other | Source: Ambulatory Visit | Attending: Pulmonary Disease | Admitting: Pulmonary Disease

## 2017-12-02 DIAGNOSIS — R0602 Shortness of breath: Secondary | ICD-10-CM | POA: Insufficient documentation

## 2017-12-02 LAB — PULMONARY FUNCTION TEST
DL/VA % PRED: 60 %
DL/VA: 2.95 ml/min/mmHg/L
DLCO UNC: 10.97 ml/min/mmHg
DLCO unc % pred: 44 %
FEF 25-75 POST: 0.86 L/s
FEF 25-75 Pre: 0.54 L/sec
FEF2575-%CHANGE-POST: 57 %
FEF2575-%PRED-POST: 54 %
FEF2575-%Pred-Pre: 34 %
FEV1-%CHANGE-POST: 13 %
FEV1-%PRED-PRE: 72 %
FEV1-%Pred-Post: 82 %
FEV1-Post: 1.46 L
FEV1-Pre: 1.29 L
FEV1FVC-%Change-Post: 2 %
FEV1FVC-%PRED-PRE: 76 %
FEV6-%Change-Post: 11 %
FEV6-%PRED-POST: 104 %
FEV6-%Pred-Pre: 94 %
FEV6-POST: 2.31 L
FEV6-Pre: 2.08 L
FEV6FVC-%CHANGE-POST: 1 %
FEV6FVC-%Pred-Post: 99 %
FEV6FVC-%Pred-Pre: 98 %
FVC-%Change-Post: 9 %
FVC-%PRED-POST: 104 %
FVC-%PRED-PRE: 95 %
FVC-Post: 2.41 L
FVC-Pre: 2.19 L
POST FEV1/FVC RATIO: 61 %
PRE FEV6/FVC RATIO: 95 %
Post FEV6/FVC ratio: 96 %
Pre FEV1/FVC ratio: 59 %
RV % pred: 133 %
RV: 3.06 L
TLC % pred: 106 %
TLC: 5.45 L

## 2017-12-02 MED ORDER — ALBUTEROL SULFATE (2.5 MG/3ML) 0.083% IN NEBU
2.5000 mg | INHALATION_SOLUTION | Freq: Once | RESPIRATORY_TRACT | Status: AC
  Administered 2017-12-02: 2.5 mg via RESPIRATORY_TRACT

## 2017-12-30 DIAGNOSIS — J449 Chronic obstructive pulmonary disease, unspecified: Secondary | ICD-10-CM | POA: Diagnosis not present

## 2017-12-30 DIAGNOSIS — I10 Essential (primary) hypertension: Secondary | ICD-10-CM | POA: Diagnosis not present

## 2017-12-30 DIAGNOSIS — E119 Type 2 diabetes mellitus without complications: Secondary | ICD-10-CM | POA: Diagnosis not present

## 2017-12-30 DIAGNOSIS — K5090 Crohn's disease, unspecified, without complications: Secondary | ICD-10-CM | POA: Diagnosis not present

## 2018-01-06 ENCOUNTER — Other Ambulatory Visit: Payer: Self-pay | Admitting: Cardiology

## 2018-01-12 ENCOUNTER — Other Ambulatory Visit (HOSPITAL_COMMUNITY): Payer: Self-pay | Admitting: Pulmonary Disease

## 2018-01-12 DIAGNOSIS — Z1231 Encounter for screening mammogram for malignant neoplasm of breast: Secondary | ICD-10-CM

## 2018-01-15 ENCOUNTER — Other Ambulatory Visit: Payer: Self-pay | Admitting: Cardiology

## 2018-01-16 ENCOUNTER — Ambulatory Visit (INDEPENDENT_AMBULATORY_CARE_PROVIDER_SITE_OTHER): Payer: Medicare Other | Admitting: Physician Assistant

## 2018-01-16 ENCOUNTER — Other Ambulatory Visit (HOSPITAL_COMMUNITY)
Admission: RE | Admit: 2018-01-16 | Discharge: 2018-01-16 | Disposition: A | Discharge status: Home / Self Care | Payer: Medicare Other | Source: Ambulatory Visit | Attending: Physician Assistant | Admitting: Physician Assistant

## 2018-01-16 ENCOUNTER — Encounter: Payer: Self-pay | Admitting: Physician Assistant

## 2018-01-16 VITALS — BP 154/84 | HR 76 | Ht 65.5 in | Wt 171.2 lb

## 2018-01-16 DIAGNOSIS — Z79899 Other long term (current) drug therapy: Secondary | ICD-10-CM

## 2018-01-16 DIAGNOSIS — N289 Disorder of kidney and ureter, unspecified: Secondary | ICD-10-CM | POA: Diagnosis not present

## 2018-01-16 DIAGNOSIS — R002 Palpitations: Secondary | ICD-10-CM

## 2018-01-16 DIAGNOSIS — I10 Essential (primary) hypertension: Secondary | ICD-10-CM | POA: Diagnosis not present

## 2018-01-16 LAB — BASIC METABOLIC PANEL
ANION GAP: 9 (ref 5–15)
BUN: 18 mg/dL (ref 6–20)
CALCIUM: 9.6 mg/dL (ref 8.9–10.3)
CO2: 30 mmol/L (ref 22–32)
Chloride: 100 mmol/L — ABNORMAL LOW (ref 101–111)
Creatinine, Ser: 0.94 mg/dL (ref 0.44–1.00)
GFR calc non Af Amer: 59 mL/min — ABNORMAL LOW (ref >60)
Glucose, Bld: 151 mg/dL — ABNORMAL HIGH (ref 65–99)
Potassium: 3.5 mmol/L (ref 3.5–5.1)
SODIUM: 139 mmol/L (ref 135–145)

## 2018-01-16 NOTE — Progress Notes (Signed)
Cardiology Office Note   Date:  01/16/2018   ID:  ROSAISELA Gomez, DOB [birth year], MRN [id number]  PCP:  April Du, MD  Cardiologist:  Dr Harl Bowie 07/2017  April Ferries, PA-C   No chief complaint on file.   History of Present Illness: April Gomez is a 72 y.o. female with a history of HTN, HLD, Type 2 DM, IBD, OA, palpitations and mitral regurgitation   Office visit 10/08/2017 for palpitations and PVCs.  Holter monitor showed PVC burden of 2.4% with no other significant arrhythmias.  EF normal.  Hypokalemia was treated.  Recommend limiting caffeine intake and avoiding albuterol if possible.  Continue beta-blocker.  April Gomez presents for cardiology follow up.   She really tries to remember the middle dose of hydralazine. She takes the middle dose in the evening, the 3 rd dose at bedtime. No problems w/ other meds, she just wants to make sure she can move the chlothalidone from pm to am if needed due to scheduling issues.   The Anoro is helping her breathe better. She does not feel limited by her breathing, she is more active now. She has chronic DOE, denies orthopnea or PND.   No chest pain.  Palpitations are controlled by the metoprolol.   LLE swells daily, she has arthritis in it. This is not new or different.   Past Medical History:  Diagnosis Date  . DDD (degenerative disc disease) CERVICAL AND LUMBAR  . Diabetes mellitus   . Hematuria   . History of bladder cancer 12/25/2012  . Hyperlipidemia   . Hypertension   . IBD (inflammatory bowel disease)   . Prolapse of vaginal vault after hysterectomy 12/25/2012  . PVC's (premature ventricular contractions)    a. Holter 2017: NSR, rare ventricular ectopy; palpitations correlated with NSR.  April Gomez Urgency of urination     Past Surgical History:  Procedure Laterality Date  . ABDOMINAL HYSTERECTOMY    . BREAST BIOPSY  03-20-2011  DR [facility]   BENIGN LEFT BREAST MASS  . BUNIONECTOMY  1980'S  .  CYSTOSCOPY/RETROGRADE/URETEROSCOPY  05/29/2012   Procedure: CYSTOSCOPY/RETROGRADE/URETEROSCOPY;  Surgeon: Claybon Jabs, MD;  Location: [facility];  Service: Urology;  Laterality: Right;   right  Retrograde Pyelogram   . HYSTEROSCOPY W/D&C  04/17/2012   Procedure: DILATATION AND CURETTAGE /HYSTEROSCOPY;  Surgeon: Jonnie Kind, MD;  Location: [facility];  Service: Gynecology;  Laterality: N/A;  POLYPECTOMY  . TRANSURETHRAL RESECTION OF BLADDER TUMOR  05/29/2012   Procedure: TRANSURETHRAL RESECTION OF BLADDER TUMOR (TURBT);  Surgeon: Claybon Jabs, MD;  Location[facility];  Service: Urology;  Laterality: N/A;  [place]  . TUBAL LIGATION  1984    Current Outpatient Medications  Medication Sig Dispense Refill  . albuterol (PROVENTIL HFA;VENTOLIN HFA) 108 (90 Base) MCG/ACT inhaler Inhale 2 puffs into the lungs every 6 (six) hours as needed for wheezing or shortness of breath.    April Gomez amLODipine (NORVASC) 10 MG tablet Take 10 mg by mouth daily with breakfast.    . aspirin EC 81 MG tablet Take 81 mg by mouth daily.     . chlorthalidone (HYGROTON) 25 MG tablet TAKE (1) TABLET BY MOUTH AT BEDTIME. 30 tablet 0  . hydrALAZINE (APRESOLINE) 50 MG tablet TAKE ONE TABLET BY MOUTH THREE TIMES DAILY. 270 tablet 3  . loratadine (CLARITIN) 10 MG tablet Take 10 mg by mouth as needed.     April Gomez LORazepam (ATIVAN) 1 MG tablet Take 1 mg  by mouth every 8 (eight) hours as needed for anxiety.    . Mesalamine (ASACOL HD) 800 MG TBEC Take 1 tablet by mouth 2 (two) times daily.    . metFORMIN (GLUCOPHAGE-XR) 500 MG 24 hr tablet Take 500 mg by mouth every evening.     . metoprolol tartrate (LOPRESSOR) 50 MG tablet Take 1 tablet (50 mg total) by mouth 2 (two) times daily. 60 tablet 11  . Multiple Vitamins-Minerals (CENTRUM SILVER PO) Take 1 tablet by mouth daily.     . pantoprazole (PROTONIX) 20 MG tablet Take 40 mg by mouth daily.     . potassium chloride SA (K-DUR,KLOR-CON) 20 MEQ tablet Take 40 meq  ( 2 tablets) in the morning and 20 meq in the evening 90 tablet 6  . pravastatin (PRAVACHOL) 40 MG tablet Take 40 mg by mouth every morning.     . umeclidinium-vilanterol (ANORO ELLIPTA) 62.5-25 MCG/INH AEPB Inhale 1 puff into the lungs daily.     No current facility-administered medications for this visit.    Facility-Administered Medications Ordered in Other Visits  Medication Dose Route Frequency Provider Last Rate Last Dose  . mitomycin (MUTAMYCIN) chemo injection 40 mg  40 mg Bladder Instillation Once Kathie Rhodes, MD        Allergies:   Lisinopril and Ceftin [cefuroxime axetil]    Social History:  The patient  reports that she quit smoking about 29 years ago. Her smoking use included cigarettes. She quit after 10.00 years of use. She has never used smokeless tobacco. She reports that she does not drink alcohol or use drugs.   Family History:  The patient's family history includes COPD in her father; Cancer in her brother and sister; Diabetes in her brother; Heart attack in her sister; Heart disease in her mother; Heart disease (age of onset: 43) in her sister.   ROS:  Please see the history of present illness. All other systems are reviewed and negative.    PHYSICAL EXAM: VS:  BP (!) 154/84   Pulse 76   Ht 5' 5.5" (1.664 m)   Wt 171 lb 3.2 oz (77.7 kg)   SpO2 95% Comment: on room air  BMI 28.06 kg/m  , BMI Body mass index is 28.06 kg/m. GEN: Well nourished, well developed, female in no acute distress  HEENT: normal for age  Neck: no JVD, no carotid bruit, no masses Cardiac: RRR; soft murmur, no rubs, or gallops Respiratory: decreased BS bases but no wheeze or rales bilaterally, normal work of breathing GI: soft, nontender, nondistended, + BS MS: no deformity or atrophy; 1+ LLE edema; distal pulses are 2+ in all 4 extremities   Skin: warm and dry, no rash Neuro:  Strength and sensation are intact Psych: euthymic mood, full affect   EKG:  EKG is not ordered  today.  Holter monitor: 09/30/2017  Min HR 63, Max HR 101, Avg HR 77. Primary rhythm is sinus  No supraventricular ectopy  Rare ventricular ectopy, all in the form of isolated PVCs and couplets  Reported symptoms correlate with sinus rhythm with occasional PVCs  No significant sustained arrhythmias    ECHO: 10/02/2017 - Left ventricle: The cavity size was normal. Wall thickness was   increased in a pattern of moderate LVH. Systolic function was   normal. The estimated ejection fraction was in the range of 60%   to 65%. Wall motion was normal; there were no regional wall   motion abnormalities. Doppler parameters are consistent with   abnormal  left ventricular relaxation (grade 1 diastolic   dysfunction). - Aortic valve: Valve area (VTI): 2.23 cm^2. Valve area (Vmax):   1.82 cm^2. Valve area (Vmean): 2 cm^2. - Left atrium: The atrium was moderately dilated. - Right atrium: The atrium was mildly dilated. - Atrial septum: No defect or patent foramen ovale was identified. - Pulmonary arteries: Systolic pressure was mildly increased. PA   peak pressure: 37 mm Hg (S). - Technically adequate study.   Recent Labs: 09/30/2017: B Natriuretic Peptide 136.0; Hemoglobin 12.1; Magnesium 1.9; Platelets 448; TSH 0.850 01/16/2018: BUN 18; Creatinine, Ser 0.94; Potassium 3.5; Sodium 139    Lipid Panel No results found for: CHOL, TRIG, HDL, CHOLHDL, VLDL, LDLCALC, LDLDIRECT   Wt Readings from Last 3 Encounters:  01/16/18 171 lb 3.2 oz (77.7 kg)  10/08/17 164 lb (74.4 kg)  09/30/17 162 lb (73.5 kg)     Other studies Reviewed: Additional studies/ records that were reviewed today include: Office notes and testing.  ASSESSMENT AND PLAN:  1.  Palpitations: They are controlled by the beta-blocker, no med changes.  2. Hypertension: Blood pressure is elevated today, but she has not had all of her home medications.  Continue to follow.  Discussed changing the timing of medications and it will be  okay for her to take the amlodipine at bedtime and move the chlorthalidone to daytime if that helps her.  She normally takes the chlorthalidone at bedtime because she can look up her urostomy tube to drainage overnight.  3.  Abnormal renal function: No BMET was last checked, her BUN was elevated.  There was concern that this was being caused by the chlorthalidone.  Recheck a BMET today.  In general, her volume is well controlled.  The lower extremity edema seems isolated to her left leg and is chronic.   Current medicines are reviewed at length with the patient today.  The patient has concerns regarding medicines.  Concerns were addressed.  The following changes have been made:  no change  Labs/ tests ordered today include:   Orders Placed This Encounter  Procedures  . Basic metabolic panel     Disposition:   FU with Dr. Harl Bowie  Signed, April Ferries, PA-C  01/16/2018 5:15 PM    [facility] Phone: [phone]; Fax: [phone]  This note was written with the assistance of speech recognition software. Please excuse any transcriptional errors.

## 2018-01-16 NOTE — Patient Instructions (Signed)
Medication Instructions:   Your physician recommends that you continue on your current medications as directed. Please refer to the Current Medication list given to you today.  Labwork:  Your physician recommends that you return for lab work in: today to check your BMET.  Testing/Procedures:  NONE  Follow-Up:  Your physician recommends that you schedule a follow-up appointment in: January 2020 with Dr. Harl Bowie.  Any Other Special Instructions Will Be Listed Below (If Applicable).  If you need a refill on your cardiac medications before your next appointment, please call your pharmacy.

## 2018-02-10 ENCOUNTER — Other Ambulatory Visit: Payer: Self-pay | Admitting: Cardiology

## 2018-02-18 ENCOUNTER — Other Ambulatory Visit (HOSPITAL_COMMUNITY): Payer: Self-pay | Admitting: Pulmonary Disease

## 2018-02-18 ENCOUNTER — Ambulatory Visit (HOSPITAL_COMMUNITY)
Admission: RE | Admit: 2018-02-18 | Discharge: 2018-02-18 | Disposition: A | Discharge status: Home / Self Care | Payer: Medicare Other | Source: Ambulatory Visit | Attending: Pulmonary Disease | Admitting: Pulmonary Disease

## 2018-02-18 ENCOUNTER — Encounter (HOSPITAL_COMMUNITY): Payer: Self-pay

## 2018-02-18 DIAGNOSIS — Z1231 Encounter for screening mammogram for malignant neoplasm of breast: Secondary | ICD-10-CM | POA: Diagnosis not present

## 2018-02-26 ENCOUNTER — Telehealth: Payer: Self-pay | Admitting: Cardiology

## 2018-02-26 NOTE — Telephone Encounter (Signed)
Spoke with pt states that she has been having nose bleeds daily for last 4 days. She states that she did not have a nose bleed yesterday after using saltine spray.  Pt reports BP today as 170/78 and BP yesterday of 158/68. She does report that nose bleed is to one nostril and she is able to stop it with out difficulty. Pt did feel lightheaded at time of nose bleed.  Please advise.

## 2018-02-26 NOTE — Telephone Encounter (Signed)
Please give pt a call-- she's been having nose bleeds and thinks it may be coming from her BP being high. Her PCP is out of the office and she doesn't really want to go to the ER

## 2018-02-27 NOTE — Telephone Encounter (Signed)
Needs to discuss nose bleeds with her pcp. Can she come next week for a bp check one day after taking all her meds and bring her cuff  J Easton Sivertson MD

## 2018-03-02 ENCOUNTER — Ambulatory Visit (INDEPENDENT_AMBULATORY_CARE_PROVIDER_SITE_OTHER): Payer: Medicare Other | Admitting: *Deleted

## 2018-03-02 VITALS — BP 158/80 | HR 77 | Ht 66.0 in | Wt 170.0 lb

## 2018-03-02 DIAGNOSIS — I10 Essential (primary) hypertension: Secondary | ICD-10-CM

## 2018-03-02 NOTE — Telephone Encounter (Signed)
Spoke with pt who voiced understanding and will come in for nurse visit today.

## 2018-03-02 NOTE — Progress Notes (Signed)
Pt feels stuffy today . States that she has not had a nose bleed today. Does not report using saltine spray today. Does report taking morning meds around 11:30 am today. Denies headaches, dizziness, or fainting at this time. Does report having sinus congestion. Denies chest pain, SOB at this time. In office BP with pt monitor is 162/82 HR 76. Pt states that when nose bleeds it does not come out the nostril. She reports that " It feels like post nasal drip". She could feel drainage in the back of her throat. She was able to spit out small clots and dark mucus. Pt reports that today drainage is now pink in color. She states that she has been using saltine spray three times daily over the weekend.

## 2018-03-03 NOTE — Progress Notes (Signed)
Im sorry, actually don't start losartan. Change her hydralazine to 100mg  tid. She will not need a bmet since not starting losartan.   Zandra Abts MD

## 2018-03-03 NOTE — Progress Notes (Signed)
Blood pressure too high. Please start losartan 25mg  daily. Nose bleeds are per her pcp. Needs BMET in 2 weeks.   Zandra Abts MD

## 2018-03-04 ENCOUNTER — Telehealth: Payer: Self-pay | Admitting: *Deleted

## 2018-03-04 DIAGNOSIS — J410 Simple chronic bronchitis: Secondary | ICD-10-CM | POA: Diagnosis not present

## 2018-03-04 DIAGNOSIS — I10 Essential (primary) hypertension: Secondary | ICD-10-CM | POA: Diagnosis not present

## 2018-03-04 DIAGNOSIS — R040 Epistaxis: Secondary | ICD-10-CM | POA: Diagnosis not present

## 2018-03-04 DIAGNOSIS — E119 Type 2 diabetes mellitus without complications: Secondary | ICD-10-CM | POA: Diagnosis not present

## 2018-03-04 MED ORDER — HYDRALAZINE HCL 100 MG PO TABS: 100.0000 mg | ORAL_TABLET | Freq: Three times a day (TID) | ORAL | 3 refills | Status: DC

## 2018-03-04 NOTE — Telephone Encounter (Signed)
-----   Message from Arnoldo Lenis, MD sent at 03/03/2018  3:16 PM EDT -----   ----- Message ----- From: Levonne Hubert, LPN Sent: 12/28/8364   4:36 PM To: Arnoldo Lenis, MD

## 2018-03-04 NOTE — Telephone Encounter (Signed)
Patient notified of med change.

## 2018-03-11 ENCOUNTER — Telehealth: Payer: Self-pay | Admitting: Cardiology

## 2018-03-11 NOTE — Telephone Encounter (Signed)
Returned pt call. She was told to call today and inform of her blood pressure. She states that the highest reading she has had all week is 128/66. She stated she feels fine.

## 2018-03-11 NOTE — Telephone Encounter (Signed)
Please call concerning BP readings

## 2018-03-17 ENCOUNTER — Other Ambulatory Visit: Payer: Self-pay | Admitting: Cardiology

## 2018-03-17 MED ORDER — CHLORTHALIDONE 25 MG PO TABS: ORAL_TABLET | ORAL | 1 refills | Status: DC

## 2018-03-17 NOTE — Telephone Encounter (Signed)
Needs refill on Chlorthalidone sent to [facility]/tg

## 2018-03-17 NOTE — Telephone Encounter (Signed)
Refill complete 

## 2018-03-31 ENCOUNTER — Other Ambulatory Visit: Payer: Self-pay | Admitting: Physician Assistant

## 2018-03-31 DIAGNOSIS — C678 Malignant neoplasm of overlapping sites of bladder: Secondary | ICD-10-CM | POA: Diagnosis not present

## 2018-03-31 NOTE — Telephone Encounter (Signed)
This is a [place] pt.  °

## 2018-04-07 DIAGNOSIS — C678 Malignant neoplasm of overlapping sites of bladder: Secondary | ICD-10-CM | POA: Diagnosis not present

## 2018-04-07 DIAGNOSIS — C679 Malignant neoplasm of bladder, unspecified: Secondary | ICD-10-CM | POA: Diagnosis not present

## 2018-05-12 ENCOUNTER — Other Ambulatory Visit: Payer: Self-pay | Admitting: Cardiology

## 2018-05-27 ENCOUNTER — Other Ambulatory Visit: Payer: Self-pay | Admitting: Physician Assistant

## 2018-06-04 DIAGNOSIS — E119 Type 2 diabetes mellitus without complications: Secondary | ICD-10-CM | POA: Diagnosis not present

## 2018-06-04 DIAGNOSIS — J410 Simple chronic bronchitis: Secondary | ICD-10-CM | POA: Diagnosis not present

## 2018-06-04 DIAGNOSIS — C679 Malignant neoplasm of bladder, unspecified: Secondary | ICD-10-CM | POA: Diagnosis not present

## 2018-06-04 DIAGNOSIS — I10 Essential (primary) hypertension: Secondary | ICD-10-CM | POA: Diagnosis not present

## 2018-06-12 ENCOUNTER — Other Ambulatory Visit: Payer: Self-pay | Admitting: Cardiology

## 2018-08-24 ENCOUNTER — Telehealth: Payer: Self-pay

## 2018-08-24 ENCOUNTER — Encounter: Payer: Self-pay | Admitting: Cardiology

## 2018-08-24 ENCOUNTER — Other Ambulatory Visit (HOSPITAL_COMMUNITY)
Admission: RE | Admit: 2018-08-24 | Discharge: 2018-08-24 | Disposition: A | Discharge status: Home / Self Care | Payer: Medicare Other | Source: Ambulatory Visit | Attending: Cardiology | Admitting: Cardiology

## 2018-08-24 ENCOUNTER — Ambulatory Visit (INDEPENDENT_AMBULATORY_CARE_PROVIDER_SITE_OTHER): Payer: Medicare Other | Admitting: Cardiology

## 2018-08-24 VITALS — BP 124/60 | HR 76 | Ht 65.0 in | Wt 165.0 lb

## 2018-08-24 DIAGNOSIS — I10 Essential (primary) hypertension: Secondary | ICD-10-CM | POA: Diagnosis not present

## 2018-08-24 DIAGNOSIS — R002 Palpitations: Secondary | ICD-10-CM

## 2018-08-24 LAB — BASIC METABOLIC PANEL
Anion gap: 9 (ref 5–15)
BUN: 14 mg/dL (ref 8–23)
CO2: 28 mmol/L (ref 22–32)
Calcium: 9.7 mg/dL (ref 8.9–10.3)
Chloride: 99 mmol/L (ref 98–111)
Creatinine, Ser: 0.9 mg/dL (ref 0.44–1.00)
GFR calc Af Amer: >60 mL/min (ref >60)
GFR calc non Af Amer: >60 mL/min (ref >60)
Glucose, Bld: 156 mg/dL — ABNORMAL HIGH (ref 70–99)
Potassium: 3.8 mmol/L (ref 3.5–5.1)
Sodium: 136 mmol/L (ref 135–145)

## 2018-08-24 LAB — MAGNESIUM: MAGNESIUM: 2 mg/dL (ref 1.7–2.4)

## 2018-08-24 NOTE — Telephone Encounter (Signed)
Called pt, No answer. Left message for pt to return call.  

## 2018-08-24 NOTE — Telephone Encounter (Signed)
-----   Message from Arnoldo Lenis, MD sent at 08/24/2018  4:26 PM EST ----- Labs look good, no med changes   Zandra Abts MD

## 2018-08-24 NOTE — Progress Notes (Signed)
Clinical Summary Ms. Egli is a 73 y.o.female seen today for follow up of the following medical problem.s  1. HTN - intolerant to ACE-I due to tongue swelling - home bp's 120s/60s - compliant with meds  2. Palpitations - 02/2016 holter no arrhythmias  09/2017 monitor with benign ectopy.  - normal TSH last year  - mild infrequent symptoms overall stable.    Past Medical History:  Diagnosis Date  . DDD (degenerative disc disease) CERVICAL AND LUMBAR  . Diabetes mellitus   . Hematuria   . History of bladder cancer 12/25/2012  . Hyperlipidemia   . Hypertension   . IBD (inflammatory bowel disease)   . Prolapse of vaginal vault after hysterectomy 12/25/2012  . PVC's (premature ventricular contractions)    a. Holter 2017: NSR, rare ventricular ectopy; palpitations correlated with NSR.  Marland Kitchen Urgency of urination      Allergies  Allergen Reactions  . Lisinopril Swelling  . Ceftin [Cefuroxime Axetil] Rash     Current Outpatient Medications  Medication Sig Dispense Refill  . albuterol (PROVENTIL HFA;VENTOLIN HFA) 108 (90 Base) MCG/ACT inhaler Inhale 2 puffs into the lungs every 6 (six) hours as needed for wheezing or shortness of breath.    Marland Kitchen amLODipine (NORVASC) 10 MG tablet Take 10 mg by mouth daily with breakfast.    . aspirin EC 81 MG tablet Take 81 mg by mouth daily.     . cetirizine (ZYRTEC) 10 MG tablet Take 10 mg by mouth daily.    . chlorthalidone (HYGROTON) 25 MG tablet TAKE (1) TABLET BY MOUTH AT BEDTIME. 30 tablet 6  . hydrALAZINE (APRESOLINE) 100 MG tablet Take 1 tablet (100 mg total) by mouth 3 (three) times daily. 270 tablet 3  . LORazepam (ATIVAN) 1 MG tablet Take 1 mg by mouth every 8 (eight) hours as needed for anxiety.    . Mesalamine (ASACOL HD) 800 MG TBEC Take 1 tablet by mouth 2 (two) times daily.    . metFORMIN (GLUCOPHAGE-XR) 500 MG 24 hr tablet Take 500 mg by mouth every evening.     . metoprolol tartrate (LOPRESSOR) 50 MG tablet Take 1 tablet  (50 mg total) by mouth 2 (two) times daily. 60 tablet 11  . Multiple Vitamins-Minerals (CENTRUM SILVER PO) Take 1 tablet by mouth daily.     . pantoprazole (PROTONIX) 40 MG tablet   11  . potassium chloride SA (K-DUR,KLOR-CON) 20 MEQ tablet Take 40 meq ( 2 tablets) in the morning and 20 meq in the evening 90 tablet 6  . potassium chloride SA (K-DUR,KLOR-CON) 20 MEQ tablet TAKE 2 TABLETS BY MOUTH EACH MORNING AND 1 TABLET IN THE EVENING. 90 tablet 0  . potassium chloride SA (K-DUR,KLOR-CON) 20 MEQ tablet TAKE 2 TABLETS BY MOUTH EACH MORNING AND 1 TABLET IN THE EVENING. 90 tablet 3  . pravastatin (PRAVACHOL) 40 MG tablet Take 40 mg by mouth every morning.     . umeclidinium-vilanterol (ANORO ELLIPTA) 62.5-25 MCG/INH AEPB Inhale 1 puff into the lungs daily.     No current facility-administered medications for this visit.    Facility-Administered Medications Ordered in Other Visits  Medication Dose Route Frequency Provider Last Rate Last Dose  . mitomycin (MUTAMYCIN) chemo injection 40 mg  40 mg Bladder Instillation Once Kathie Rhodes, MD         Past Surgical History:  Procedure Laterality Date  . ABDOMINAL HYSTERECTOMY    . BREAST BIOPSY  03-20-2011  DR [facility]   BENIGN LEFT BREAST  MASS  . BUNIONECTOMY  1980'S  . CYSTOSCOPY/RETROGRADE/URETEROSCOPY  05/29/2012   Procedure: CYSTOSCOPY/RETROGRADE/URETEROSCOPY;  Surgeon: Claybon Jabs, MD;  Location: [facility];  Service: Urology;  Laterality: Right;   right  Retrograde Pyelogram   . HYSTEROSCOPY W/D&C  04/17/2012   Procedure: DILATATION AND CURETTAGE /HYSTEROSCOPY;  Surgeon: Jonnie Kind, MD;  Location: [facility];  Service: Gynecology;  Laterality: N/A;  POLYPECTOMY  . TRANSURETHRAL RESECTION OF BLADDER TUMOR  05/29/2012   Procedure: TRANSURETHRAL RESECTION OF BLADDER TUMOR (TURBT);  Surgeon: Claybon Jabs, MD;  Location: [facility];  Service: Urology;  Laterality: N/A;  [place]  . TUBAL LIGATION  1984      Allergies  Allergen Reactions  . Lisinopril Swelling  . Ceftin [Cefuroxime Axetil] Rash      Family History  Problem Relation Age of Onset  . Heart disease Mother        Heart attack and stroke age 56  . COPD Father   . Cancer Sister        breast   . Diabetes Brother   . Cancer Brother        prostate  . Heart attack Sister        31  . Heart disease Sister 16       Defibrillator     Social History Ms. Berhe reports that she quit smoking about 30 years ago. Her smoking use included cigarettes. She quit after 10.00 years of use. She has never used smokeless tobacco. Ms. Dungan reports no history of alcohol use.   Review of Systems CONSTITUTIONAL: No weight loss, fever, chills, weakness or fatigue.  HEENT: Eyes: No visual loss, blurred vision, double vision or yellow sclerae.No hearing loss, sneezing, congestion, runny nose or sore throat.  SKIN: No rash or itching.  CARDIOVASCULAR: per hpi RESPIRATORY: No shortness of breath, cough or sputum.  GASTROINTESTINAL: No anorexia, nausea, vomiting or diarrhea. No abdominal pain or blood.  GENITOURINARY: No burning on urination, no polyuria NEUROLOGICAL: No headache, dizziness, syncope, paralysis, ataxia, numbness or tingling in the extremities. No change in bowel or bladder control.  MUSCULOSKELETAL: No muscle, back pain, joint pain or stiffness.  LYMPHATICS: No enlarged nodes. No history of splenectomy.  PSYCHIATRIC: No history of depression or anxiety.  ENDOCRINOLOGIC: No reports of sweating, cold or heat intolerance. No polyuria or polydipsia.  Marland Kitchen   Physical Examination Vitals:   08/24/18 1141  BP: 124/60  Pulse: 76  SpO2: 96%   Vitals:   08/24/18 1141  Weight: 165 lb (74.8 kg)  Height: 5\' 5"  (1.651 m)    Gen: resting comfortably, no acute distress HEENT: no scleral icterus, pupils equal round and reactive, no palptable cervical adenopathy,  CV: RRR, no m/r/g, no jvd Resp: Clear to auscultation  bilaterally GI: abdomen is soft, non-tender, non-distended, normal bowel sounds, no hepatosplenomegaly MSK: extremities are warm, no edema.  Skin: warm, no rash Neuro:  no focal deficits Psych: appropriate affect   Diagnostic Studies 02/2016 holter  Rhythm throughout study is normal sinus rhythm  No supraventricular ectopy, rare ventricular ectopy.  Min HR 61, Max HR 98, Avg HR 72  Reported symptoms of palpitations correlated with normal sinus rhythm  12/2015 Carotid US IMPRESSION: 1. Mild bilateral carotid bifurcation and proximal ICA plaque, resulting in less than 50% diameter stenosis. The exam does not exclude plaque ulceration or embolization. Continued surveillance recommended. 2. Antegrade bilateral vertebral arterial flow.    Assessment and Plan  1. HTN - at  goal, continue current meds - repeat BMET/Mg on chlorthalidone.   2. Palpitaitons - mild symptoms, she is not interested in increasing lopressor at this time - continue current meds   F/u 6 months      Arnoldo Lenis, M.D.

## 2018-08-24 NOTE — Patient Instructions (Signed)
Medication Instructions:  Your physician recommends that you continue on your current medications as directed. Please refer to the Current Medication list given to you today.   Labwork: BMET MAGNESIUM  I WILL REQUEST LABS FROM PCP  Testing/Procedures: NONE  Follow-Up: Your physician wants you to follow-up in: 6 MONTHS.  You will receive a reminder letter in the mail two months in advance. If you don't receive a letter, please call our office to schedule the follow-up appointment.   Any Other Special Instructions Will Be Listed Below (If Applicable).     If you need a refill on your cardiac medications before your next appointment, please call your pharmacy.

## 2018-09-03 DIAGNOSIS — E785 Hyperlipidemia, unspecified: Secondary | ICD-10-CM | POA: Diagnosis not present

## 2018-09-03 DIAGNOSIS — I10 Essential (primary) hypertension: Secondary | ICD-10-CM | POA: Diagnosis not present

## 2018-09-03 DIAGNOSIS — E119 Type 2 diabetes mellitus without complications: Secondary | ICD-10-CM | POA: Diagnosis not present

## 2018-09-03 DIAGNOSIS — J410 Simple chronic bronchitis: Secondary | ICD-10-CM | POA: Diagnosis not present

## 2018-09-28 ENCOUNTER — Other Ambulatory Visit: Payer: Self-pay | Admitting: Physician Assistant

## 2018-12-02 DIAGNOSIS — I10 Essential (primary) hypertension: Secondary | ICD-10-CM | POA: Diagnosis not present

## 2018-12-02 DIAGNOSIS — K210 Gastro-esophageal reflux disease with esophagitis: Secondary | ICD-10-CM | POA: Diagnosis not present

## 2018-12-02 DIAGNOSIS — J301 Allergic rhinitis due to pollen: Secondary | ICD-10-CM | POA: Diagnosis not present

## 2018-12-17 ENCOUNTER — Encounter (HOSPITAL_COMMUNITY): Payer: Self-pay

## 2018-12-17 ENCOUNTER — Other Ambulatory Visit: Payer: Self-pay

## 2018-12-17 ENCOUNTER — Inpatient Hospital Stay (HOSPITAL_COMMUNITY)
Admission: EM | Admit: 2018-12-17 | Discharge: 2018-12-22 | DRG: 390 | Disposition: A | Discharge status: Home / Self Care | Payer: Medicare Other | Attending: Pulmonary Disease | Admitting: Pulmonary Disease

## 2018-12-17 ENCOUNTER — Emergency Department (HOSPITAL_COMMUNITY): Payer: Medicare Other

## 2018-12-17 DIAGNOSIS — K5651 Intestinal adhesions [bands], with partial obstruction: Principal | ICD-10-CM | POA: Diagnosis present

## 2018-12-17 DIAGNOSIS — Z79899 Other long term (current) drug therapy: Secondary | ICD-10-CM

## 2018-12-17 DIAGNOSIS — Z7982 Long term (current) use of aspirin: Secondary | ICD-10-CM

## 2018-12-17 DIAGNOSIS — J449 Chronic obstructive pulmonary disease, unspecified: Secondary | ICD-10-CM | POA: Diagnosis present

## 2018-12-17 DIAGNOSIS — K439 Ventral hernia without obstruction or gangrene: Secondary | ICD-10-CM

## 2018-12-17 DIAGNOSIS — Z7951 Long term (current) use of inhaled steroids: Secondary | ICD-10-CM

## 2018-12-17 DIAGNOSIS — IMO0001 Reserved for inherently not codable concepts without codable children: Secondary | ICD-10-CM | POA: Diagnosis present

## 2018-12-17 DIAGNOSIS — Z8551 Personal history of malignant neoplasm of bladder: Secondary | ICD-10-CM

## 2018-12-17 DIAGNOSIS — Z936 Other artificial openings of urinary tract status: Secondary | ICD-10-CM

## 2018-12-17 DIAGNOSIS — N189 Chronic kidney disease, unspecified: Secondary | ICD-10-CM | POA: Diagnosis not present

## 2018-12-17 DIAGNOSIS — Z1159 Encounter for screening for other viral diseases: Secondary | ICD-10-CM | POA: Diagnosis not present

## 2018-12-17 DIAGNOSIS — I129 Hypertensive chronic kidney disease with stage 1 through stage 4 chronic kidney disease, or unspecified chronic kidney disease: Secondary | ICD-10-CM | POA: Diagnosis not present

## 2018-12-17 DIAGNOSIS — K56600 Partial intestinal obstruction, unspecified as to cause: Secondary | ICD-10-CM | POA: Diagnosis not present

## 2018-12-17 DIAGNOSIS — Z803 Family history of malignant neoplasm of breast: Secondary | ICD-10-CM

## 2018-12-17 DIAGNOSIS — K529 Noninfective gastroenteritis and colitis, unspecified: Secondary | ICD-10-CM | POA: Diagnosis present

## 2018-12-17 DIAGNOSIS — E785 Hyperlipidemia, unspecified: Secondary | ICD-10-CM | POA: Diagnosis present

## 2018-12-17 DIAGNOSIS — Z825 Family history of asthma and other chronic lower respiratory diseases: Secondary | ICD-10-CM

## 2018-12-17 DIAGNOSIS — Z90710 Acquired absence of both cervix and uterus: Secondary | ICD-10-CM

## 2018-12-17 DIAGNOSIS — Z906 Acquired absence of other parts of urinary tract: Secondary | ICD-10-CM

## 2018-12-17 DIAGNOSIS — Z03818 Encounter for observation for suspected exposure to other biological agents ruled out: Secondary | ICD-10-CM | POA: Diagnosis not present

## 2018-12-17 DIAGNOSIS — I10 Essential (primary) hypertension: Secondary | ICD-10-CM | POA: Diagnosis present

## 2018-12-17 DIAGNOSIS — E1122 Type 2 diabetes mellitus with diabetic chronic kidney disease: Secondary | ICD-10-CM | POA: Diagnosis not present

## 2018-12-17 DIAGNOSIS — K56609 Unspecified intestinal obstruction, unspecified as to partial versus complete obstruction: Secondary | ICD-10-CM | POA: Diagnosis not present

## 2018-12-17 DIAGNOSIS — R109 Unspecified abdominal pain: Secondary | ICD-10-CM

## 2018-12-17 DIAGNOSIS — K435 Parastomal hernia without obstruction or  gangrene: Secondary | ICD-10-CM | POA: Diagnosis present

## 2018-12-17 DIAGNOSIS — Z888 Allergy status to other drugs, medicaments and biological substances status: Secondary | ICD-10-CM

## 2018-12-17 DIAGNOSIS — Z932 Ileostomy status: Secondary | ICD-10-CM

## 2018-12-17 DIAGNOSIS — F419 Anxiety disorder, unspecified: Secondary | ICD-10-CM | POA: Diagnosis present

## 2018-12-17 DIAGNOSIS — Z823 Family history of stroke: Secondary | ICD-10-CM

## 2018-12-17 DIAGNOSIS — Z8042 Family history of malignant neoplasm of prostate: Secondary | ICD-10-CM

## 2018-12-17 DIAGNOSIS — R1110 Vomiting, unspecified: Secondary | ICD-10-CM | POA: Diagnosis not present

## 2018-12-17 DIAGNOSIS — E876 Hypokalemia: Secondary | ICD-10-CM | POA: Diagnosis not present

## 2018-12-17 DIAGNOSIS — I340 Nonrheumatic mitral (valve) insufficiency: Secondary | ICD-10-CM | POA: Diagnosis present

## 2018-12-17 DIAGNOSIS — Z833 Family history of diabetes mellitus: Secondary | ICD-10-CM

## 2018-12-17 DIAGNOSIS — R112 Nausea with vomiting, unspecified: Secondary | ICD-10-CM | POA: Diagnosis not present

## 2018-12-17 DIAGNOSIS — R682 Dry mouth, unspecified: Secondary | ICD-10-CM | POA: Diagnosis not present

## 2018-12-17 DIAGNOSIS — Z87891 Personal history of nicotine dependence: Secondary | ICD-10-CM

## 2018-12-17 DIAGNOSIS — Z8249 Family history of ischemic heart disease and other diseases of the circulatory system: Secondary | ICD-10-CM

## 2018-12-17 LAB — COMPREHENSIVE METABOLIC PANEL
ALT: 20 U/L (ref 0–44)
AST: 22 U/L (ref 15–41)
Albumin: 4.2 g/dL (ref 3.5–5.0)
Alkaline Phosphatase: 58 U/L (ref 38–126)
Anion gap: 13 (ref 5–15)
BUN: 24 mg/dL — ABNORMAL HIGH (ref 8–23)
CO2: 28 mmol/L (ref 22–32)
Calcium: 9.7 mg/dL (ref 8.9–10.3)
Chloride: 97 mmol/L — ABNORMAL LOW (ref 98–111)
Creatinine, Ser: 0.92 mg/dL (ref 0.44–1.00)
GFR calc Af Amer: >60 mL/min (ref >60)
GFR calc non Af Amer: >60 mL/min (ref >60)
Glucose, Bld: 202 mg/dL — ABNORMAL HIGH (ref 70–99)
Potassium: 2.6 mmol/L — CL (ref 3.5–5.1)
Sodium: 138 mmol/L (ref 135–145)
Total Bilirubin: 0.6 mg/dL (ref 0.3–1.2)
Total Protein: 8 g/dL (ref 6.5–8.1)

## 2018-12-17 LAB — URINALYSIS, ROUTINE W REFLEX MICROSCOPIC
Bilirubin Urine: NEGATIVE
Glucose, UA: NEGATIVE mg/dL
Hgb urine dipstick: NEGATIVE
Ketones, ur: NEGATIVE mg/dL
Nitrite: NEGATIVE
Protein, ur: 100 mg/dL — AB
Specific Gravity, Urine: 1.017 (ref 1.005–1.030)
pH: 6 (ref 5.0–8.0)

## 2018-12-17 LAB — CBC
HCT: 40.8 % (ref 36.0–46.0)
Hemoglobin: 13.1 g/dL (ref 12.0–15.0)
MCH: 29 pg (ref 26.0–34.0)
MCHC: 32.1 g/dL (ref 30.0–36.0)
MCV: 90.5 fL (ref 80.0–100.0)
Platelets: 400 10*3/uL (ref 150–400)
RBC: 4.51 MIL/uL (ref 3.87–5.11)
RDW: 13.5 % (ref 11.5–15.5)
WBC: 14.7 10*3/uL — ABNORMAL HIGH (ref 4.0–10.5)
nRBC: 0 % (ref 0.0–0.2)

## 2018-12-17 LAB — LIPASE, BLOOD: Lipase: 32 U/L (ref 11–51)

## 2018-12-17 MED ORDER — POTASSIUM CHLORIDE 10 MEQ/100ML IV SOLN
10.0000 meq | INTRAVENOUS | Status: AC
  Administered 2018-12-17 – 2018-12-18 (×5): 10 meq via INTRAVENOUS
  Filled 2018-12-17 (×3): qty 100

## 2018-12-17 MED ORDER — INSULIN ASPART 100 UNIT/ML ~~LOC~~ SOLN: 0.0000 [IU] | Freq: Three times a day (TID) | SUBCUTANEOUS | Status: DC

## 2018-12-17 MED ORDER — IOHEXOL 300 MG/ML  SOLN
100.0000 mL | Freq: Once | INTRAMUSCULAR | Status: AC | PRN
  Administered 2018-12-17: 100 mL via INTRAVENOUS

## 2018-12-17 MED ORDER — SODIUM CHLORIDE 0.9% FLUSH: 3.0000 mL | Freq: Once | INTRAVENOUS | Status: DC

## 2018-12-17 MED ORDER — MORPHINE SULFATE (PF) 4 MG/ML IV SOLN
4.0000 mg | Freq: Once | INTRAVENOUS | Status: AC
  Administered 2018-12-17: 4 mg via INTRAVENOUS
  Filled 2018-12-17: qty 1

## 2018-12-17 MED ORDER — ONDANSETRON HCL 4 MG/2ML IJ SOLN
4.0000 mg | Freq: Once | INTRAMUSCULAR | Status: AC
  Administered 2018-12-17: 4 mg via INTRAVENOUS
  Filled 2018-12-17: qty 2

## 2018-12-17 MED ORDER — POTASSIUM CHLORIDE IN NACL 40-0.9 MEQ/L-% IV SOLN
INTRAVENOUS | Status: DC
  Administered 2018-12-18 – 2018-12-21 (×9): 100 mL/h via INTRAVENOUS
  Filled 2018-12-17 (×4): qty 1000

## 2018-12-17 MED ORDER — SODIUM CHLORIDE 0.9 % IV BOLUS
500.0000 mL | Freq: Once | INTRAVENOUS | Status: AC
  Administered 2018-12-17: 500 mL via INTRAVENOUS

## 2018-12-17 NOTE — H&P (Signed)
Patient Demographics:    April Gomez, is a 73 y.o. female  MRN: [id number]   DOB - 11/11/1945  Admit Date - 12/17/2018  Outpatient Primary MD for the patient is Sinda Du, MD   Assessment & Plan:    Principal Problem:   SBO (small bowel obstruction) ([place]) Active Problems:   Essential hypertension   IBD (inflammatory bowel disease)   Type 2 diabetes mellitus with chronic kidney disease and hypertension ([place])   History of bladder cancer   1)SBO---  CT abd/pelvis with Multiple dilated loops of fluid-filled small bowel within the left abdomen and pelvis, consistent with mechanical small bowel obstruction, likely due to adhesions. Probable transition point within the low left pelvis just cephalad to the level of left superior pubic ramus------ n.p.o. except for medications, NG tube placed to low intermittent suction, continue PRN antiemetics and pain medications,.... Surgical consult from Dr. Arnoldo Morale requested.... Patient states no prior history of SBO, give IV Pepcid for GI prophylaxis  2)FEN/Hypokalemia--- due to GI losses potassium is down to 2.6, replace IV..... Given the emaciation NG tube placement oral replacement would not be reliable, also check and replace magnesium, okay to clamp NG tube after medication administration,   3)DM2-no recent A1c available, hold metformin..... Given n.p.o. status.....Marland KitchenMarland KitchenAllow some permissive Hyperglycemia rather than risk life-threatening hypoglycemia in a patient with unreliable oral intake. Use Novolog/Humalog Sliding scale insulin with Accu-Cheks/Fingersticks as ordered   4)HTN--resume hydralazine and amlodipine as well as metoprolol, hold chlorthalidone, okay to clamp NG tube after medication administration,  may use IV labetalol when necessary  Every 4 hours for systolic  blood pressure over 160 mmhg  5)IBD--- hold Asacol for now... No obvious flareup at this time  6)HLD--- okay to hold pravastatin and aspirin while patient is n.p.o.  With History of - Reviewed by me  Past Medical History:  Diagnosis Date  . DDD (degenerative disc disease) CERVICAL AND LUMBAR  . Diabetes mellitus   . Hematuria   . History of bladder cancer 12/25/2012  . Hyperlipidemia   . Hypertension   . IBD (inflammatory bowel disease)   . Prolapse of vaginal vault after hysterectomy 12/25/2012  . PVC's (premature ventricular contractions)    a. Holter 2017: NSR, rare ventricular ectopy; palpitations correlated with NSR.  Marland Kitchen Urgency of urination       Past Surgical History:  Procedure Laterality Date  . ABDOMINAL HYSTERECTOMY    . BREAST BIOPSY  03-20-2011  DR STRECK   BENIGN LEFT BREAST MASS  . BUNIONECTOMY  1980'S  . CYSTOSCOPY/RETROGRADE/URETEROSCOPY  05/29/2012   Procedure: CYSTOSCOPY/RETROGRADE/URETEROSCOPY;  Surgeon: Claybon Jabs, MD;  Location: [facility];  Service: Urology;  Laterality: Right;   right  Retrograde Pyelogram   . HYSTEROSCOPY W/D&C  04/17/2012   Procedure: DILATATION AND CURETTAGE /HYSTEROSCOPY;  Surgeon: Jonnie Kind, MD;  Location: [facility];  Service: Gynecology;  Laterality: N/A;  POLYPECTOMY  . TRANSURETHRAL RESECTION OF BLADDER TUMOR  05/29/2012   Procedure: TRANSURETHRAL RESECTION OF BLADDER TUMOR (TURBT);  Surgeon: Claybon Jabs, MD;  Location: [facility];  Service: Urology;  Laterality: N/A;  [place]  . [place]    Chief Complaint  Patient presents with  . Abdominal Pain      HPI:    April Gomez  is a 73 y.o. female with PMHx relevant for  HTN, HLD, Type 2 DM, IBD, palpitations and mitral regurgitation , Status post cystectomy in 2014 at [facility] with associated urostomy for history of bladder cancer presents to ED on 12/17/18 with N/V and abd pain since the evening of Tuesday 12/15/18 ... Emesis  was without blood or bile Patient had a small formed stool on 12/16/2018.Marland KitchenMarland KitchenMarland KitchenMarland Kitchen Emesis and abdominal discomfort persisted and patient presented to the ER..  In ED... She is found to have leukocytosis of 14.7 and hypokalemia with potassium of 2.6, lipase is not elevated.... UA suspicious for UTI however patient is s/p cystectomy/urostomy so urine may look contaminated  No f/c, no cp, no palpitations or dizziness  In ED CT abd/pelvis with Multiple dilated loops of fluid-filled small bowel within the left abdomen and pelvis, consistent with mechanical small bowel obstruction, likely due to adhesions. Probable transition point within the low left pelvis just cephalad to the level of left superior pubic ramus.  COVID -19 is negative   Review of systems:    In addition to the HPI above,   A full Review of  Systems was done, all other systems reviewed are negative except as noted above in HPI , .   Social History:  Reviewed by me    Social History   Tobacco Use  . Smoking status: Former Smoker    Years: 10.00    Types: Cigarettes    Last attempt to quit: 05/26/1988    Years since quitting: 30.5  . Smokeless tobacco: Never Used  Substance Use Topics  . Alcohol use: No     Family History :  Reviewed by me    Family History  Problem Relation Age of Onset  . Heart disease Mother        Heart attack and stroke age 26  . COPD Father   . Cancer Sister        breast   . Diabetes Brother   . Cancer Brother        prostate  . Heart attack Sister        37  . Heart disease Sister 38       Defibrillator    Home Medications:   Prior to Admission medications   Medication Sig Start Date End Date Taking? Authorizing Provider  albuterol (PROVENTIL HFA;VENTOLIN HFA) 108 (90 Base) MCG/ACT inhaler Inhale 2 puffs into the lungs every 6 (six) hours as needed for wheezing or shortness of breath.    [provider]  amLODipine (NORVASC) 10 MG tablet Take 10 mg by mouth daily with  breakfast.    [provider]  aspirin EC 81 MG tablet Take 81 mg by mouth daily.     [provider]  cetirizine (ZYRTEC) 10 MG tablet Take 10 mg by mouth daily.    [provider]  chlorthalidone (HYGROTON) 25 MG tablet TAKE (1) TABLET BY MOUTH AT BEDTIME. 06/12/18   Branch, Alphonse Guild, MD  hydrALAZINE (APRESOLINE) 100 MG tablet Take 1 tablet (100 mg total) by mouth 3 (three) times daily. 03/04/18   Arnoldo Lenis, MD  LORazepam (ATIVAN)  1 MG tablet Take 1 mg by mouth every 8 (eight) hours as needed for anxiety.    [provider]  Mesalamine (ASACOL HD) 800 MG TBEC Take 1 tablet by mouth 2 (two) times daily.    [provider]  metFORMIN (GLUCOPHAGE-XR) 500 MG 24 hr tablet Take 500 mg by mouth every evening.     [provider]  metoprolol tartrate (LOPRESSOR) 50 MG tablet TAKE (1) TABLET BY MOUTH TWICE DAILY. 09/29/18   Dunn, Nedra Hai, PA-C  Multiple Vitamins-Minerals (CENTRUM SILVER PO) Take 1 tablet by mouth daily.     [provider]  pantoprazole (PROTONIX) 40 MG tablet  02/23/18   [provider]  potassium chloride SA (K-DUR,KLOR-CON) 20 MEQ tablet TAKE 2 TABLETS BY MOUTH EACH MORNING AND 1 TABLET IN THE EVENING. 09/29/18   Dunn, Dayna N, PA-C  pravastatin (PRAVACHOL) 40 MG tablet Take 40 mg by mouth every morning.     [provider]  umeclidinium-vilanterol (ANORO ELLIPTA) 62.5-25 MCG/INH AEPB Inhale 1 puff into the lungs daily.    [provider]     Allergies:     Allergies  Allergen Reactions  . Lisinopril Swelling  . Ceftin [Cefuroxime Axetil] Rash     Physical Exam:   Vitals  Blood pressure (!) 133/51, pulse 69, temperature 98.5 F (36.9 C), temperature source Oral, resp. rate 18, height 5\' 5"  (1.651 m), weight 74.8 kg, SpO2 92 %.  Physical Examination: General appearance - alert, well appearing, and in no distress  Mental status - alert, oriented to person, place, and time,  Nose-  NG tube  Eyes - sclera anicteric Neck - supple, no JVD elevation , Chest - clear  to auscultation bilaterally, symmetrical air movement,  Heart - S1 and S2 normal, regular  Abdomen - soft, generalized tenderness without rebound or guarding, not significantly distended, scars from prior Laparotomy,  urostomy in lower abdomen; ventral hernia to the right of urostomy,   Neurological - screening mental status exam normal, neck supple without rigidity, cranial nerves II through XII intact, DTR's normal and symmetric Extremities - no pedal edema noted, intact peripheral pulses  Skin - warm, dry    Data Review:    CBC Recent Labs  Lab 12/17/18 2017  WBC 14.7*  HGB 13.1  HCT 40.8  PLT 400  MCV 90.5  MCH 29.0  MCHC 32.1  RDW 13.5   ------------------------------------------------------------------------------------------------------------------  Chemistries  Recent Labs  Lab 12/17/18 2017  NA 138  K 2.6*  CL 97*  CO2 28  GLUCOSE 202*  BUN 24*  CREATININE 0.92  CALCIUM 9.7  AST 22  ALT 20  ALKPHOS 58  BILITOT 0.6   ------------------------------------------------------------------------------------------------------------------ estimated creatinine clearance is 54.3 mL/min (by C-G formula based on SCr of 0.92 mg/dL). ------------------------------------------------------------------------------------------------------------------ No results for input(s): TSH, T4TOTAL, T3FREE, THYROIDAB in the last 72 hours.  Invalid input(s): FREET3   Coagulation profile No results for input(s): INR, PROTIME in the last 168 hours. ------------------------------------------------------------------------------------------------------------------- No results for input(s): DDIMER in the last 72 hours. -------------------------------------------------------------------------------------------------------------------  Cardiac Enzymes No results for input(s): CKMB, TROPONINI, MYOGLOBIN in  the last 168 hours.  Invalid input(s): CK ------------------------------------------------------------------------------------------------------------------    Component Value Date/Time   BNP 136.0 (H) 09/30/2017 1205     ---------------------------------------------------------------------------------------------------------------  Urinalysis    Component Value Date/Time   COLORURINE YELLOW 12/17/2018 2100   APPEARANCEUR HAZY (A) 12/17/2018 2100   LABSPEC 1.017 12/17/2018 2100   PHURINE 6.0 12/17/2018 2100   GLUCOSEU NEGATIVE 12/17/2018  2100   HGBUR NEGATIVE 12/17/2018 2100   BILIRUBINUR NEGATIVE 12/17/2018 2100   KETONESUR NEGATIVE 12/17/2018 2100   PROTEINUR 100 (A) 12/17/2018 2100   UROBILINOGEN 0.2 10/11/2013 0222   NITRITE NEGATIVE 12/17/2018 2100   LEUKOCYTESUR SMALL (A) 12/17/2018 2100    ----------------------------------------------------------------------------------------------------------------   Imaging Results:    Ct Abdomen Pelvis W Contrast  Result Date: 12/17/2018 CLINICAL DATA:  Abdominal pain with nausea and vomiting EXAM: CT ABDOMEN AND PELVIS WITH CONTRAST TECHNIQUE: Multidetector CT imaging of the abdomen and pelvis was performed using the standard protocol following bolus administration of intravenous contrast. CONTRAST:  161mL OMNIPAQUE IOHEXOL 300 MG/ML  SOLN COMPARISON:  06/10/2012 FINDINGS: Lower chest: Lung bases demonstrate emphysematous disease. No acute consolidation or effusion. Borderline to mild cardiomegaly. Hepatobiliary: Hypodensity at the dome of the liver measuring 18 mm, with increased internal density values. Additional subcentimeter hypodensities in the liver too small to further characterize. No calcified gallstone or biliary dilatation Pancreas: Unremarkable. No pancreatic ductal dilatation or surrounding inflammatory changes. Spleen: Normal in size without focal abnormality. Adrenals/Urinary Tract: Adrenal glands appear within normal  limits. Interval cystectomy. Multiple subcentimeter hypodense cortical lesions in the kidneys, too small to further characterize. Multifocal scarring within the left kidney. No hydronephrosis. No hydroureter. Urostomy to the right lower quadrant. Stomach/Bowel: Stomach nonenlarged. Multiple dilated loops of fluid-filled small bowel measuring up to 3.3 cm in the left lower quadrant. Beaked appearance of distal small bowel in the left low pelvis felt consistent with transition point. No colon wall thickening. Right lower quadrant large parastomal hernia containing mesenteric fat and colon. No obstruction. Vascular/Lymphatic: Extensive aortic atherosclerosis. No aneurysm. No significantly enlarged lymph nodes. Reproductive: Status post hysterectomy Other: Negative for free air or free fluid Musculoskeletal: No acute or suspicious osseous abnormality. IMPRESSION: 1. Multiple dilated loops of fluid-filled small bowel within the left abdomen and pelvis, consistent with mechanical small bowel obstruction, likely due to adhesions. Probable transition point within the low left pelvis just cephalad to the level of left superior pubic ramus. 2. Interval cystectomy and creation of right lower quadrant urinary diversion. Moderate to large right parastomal hernia containing mesenteric fat and colon but no evidence for obstruction or strangulation. 3. Interim finding of multifocal cortical scarring within the left kidney 4. Emphysematous disease 5. Indeterminate hypodensity at the dome of the liver. This is in the region of a previously noted cyst but now appears smaller in size with increased internal density. Electronically Signed   By: Donavan Foil M.D.   On: 12/17/2018 22:35   Dg Abd Portable 1 View  Result Date: 12/18/2018 CLINICAL DATA:  73 year old female with abdominal pain nausea and vomiting. EXAM: PORTABLE ABDOMEN - 1 VIEW COMPARISON:  CT Abdomen and Pelvis yesterday FINDINGS: Portable AP supine view at 0022 hours.  Enteric tube has been placed with side hole at the level of the gastric body. Persistent small bowel obstruction gas pattern with gas-filled loops dilated up to 43 millimeters. Stable gas pattern since yesterday. Stable lung bases. No definite pneumoperitoneum on this supine view. IMPRESSION: 1. Enteric tube placed, side hole at the level of the gastric body. 2. Stable small bowel obstruction gas pattern. Electronically Signed   By: Genevie Ann M.D.   On: 12/18/2018 00:55    Radiological Exams on Admission: Ct Abdomen Pelvis W Contrast  Result Date: 12/17/2018 CLINICAL DATA:  Abdominal pain with nausea and vomiting EXAM: CT ABDOMEN AND PELVIS WITH CONTRAST TECHNIQUE: Multidetector CT imaging of the abdomen and pelvis was performed using  the standard protocol following bolus administration of intravenous contrast. CONTRAST:  136mL OMNIPAQUE IOHEXOL 300 MG/ML  SOLN COMPARISON:  06/10/2012 FINDINGS: Lower chest: Lung bases demonstrate emphysematous disease. No acute consolidation or effusion. Borderline to mild cardiomegaly. Hepatobiliary: Hypodensity at the dome of the liver measuring 18 mm, with increased internal density values. Additional subcentimeter hypodensities in the liver too small to further characterize. No calcified gallstone or biliary dilatation Pancreas: Unremarkable. No pancreatic ductal dilatation or surrounding inflammatory changes. Spleen: Normal in size without focal abnormality. Adrenals/Urinary Tract: Adrenal glands appear within normal limits. Interval cystectomy. Multiple subcentimeter hypodense cortical lesions in the kidneys, too small to further characterize. Multifocal scarring within the left kidney. No hydronephrosis. No hydroureter. Urostomy to the right lower quadrant. Stomach/Bowel: Stomach nonenlarged. Multiple dilated loops of fluid-filled small bowel measuring up to 3.3 cm in the left lower quadrant. Beaked appearance of distal small bowel in the left low pelvis felt consistent  with transition point. No colon wall thickening. Right lower quadrant large parastomal hernia containing mesenteric fat and colon. No obstruction. Vascular/Lymphatic: Extensive aortic atherosclerosis. No aneurysm. No significantly enlarged lymph nodes. Reproductive: Status post hysterectomy Other: Negative for free air or free fluid Musculoskeletal: No acute or suspicious osseous abnormality. IMPRESSION: 1. Multiple dilated loops of fluid-filled small bowel within the left abdomen and pelvis, consistent with mechanical small bowel obstruction, likely due to adhesions. Probable transition point within the low left pelvis just cephalad to the level of left superior pubic ramus. 2. Interval cystectomy and creation of right lower quadrant urinary diversion. Moderate to large right parastomal hernia containing mesenteric fat and colon but no evidence for obstruction or strangulation. 3. Interim finding of multifocal cortical scarring within the left kidney 4. Emphysematous disease 5. Indeterminate hypodensity at the dome of the liver. This is in the region of a previously noted cyst but now appears smaller in size with increased internal density. Electronically Signed   By: Donavan Foil M.D.   On: 12/17/2018 22:35   Dg Abd Portable 1 View  Result Date: 12/18/2018 CLINICAL DATA:  73 year old female with abdominal pain nausea and vomiting. EXAM: PORTABLE ABDOMEN - 1 VIEW COMPARISON:  CT Abdomen and Pelvis yesterday FINDINGS: Portable AP supine view at 0022 hours. Enteric tube has been placed with side hole at the level of the gastric body. Persistent small bowel obstruction gas pattern with gas-filled loops dilated up to 43 millimeters. Stable gas pattern since yesterday. Stable lung bases. No definite pneumoperitoneum on this supine view. IMPRESSION: 1. Enteric tube placed, side hole at the level of the gastric body. 2. Stable small bowel obstruction gas pattern. Electronically Signed   By: Genevie Ann M.D.   On:  12/18/2018 00:55    DVT Prophylaxis -SCD/Heparin AM Labs Ordered, also please review Full Orders  Family Communication: Admission, patients condition and plan of care including tests being ordered have been discussed with the patient who indicate understanding and agree with the plan   Code Status - Full Code  Likely DC to  home  Condition   Stable  Roxan Hockey M.D on 12/18/2018 at 1:38 AM Go to [email or web address] -  for contact info  [facility] - Office  [phone]

## 2018-12-17 NOTE — ED Triage Notes (Signed)
Pt c/o abdominal pain, nausea, and vomiting since Tuesday. Pt with history of hiatal hernia.

## 2018-12-17 NOTE — ED Notes (Signed)
Date and time results received: 12/17/18 2129   Test: K+ Critical Value: 2.6  Name of Provider Notified: MD, Lacinda Axon

## 2018-12-17 NOTE — ED Provider Notes (Addendum)
[facility] EMERGENCY DEPARTMENT Provider Note   CSN: [id number] Arrival date & time: 12/17/18  1934    History   Chief Complaint Chief Complaint  Patient presents with  . Abdominal Pain    HPI April Gomez is a 73 y.o. female.     Periumbilical abdominal pain c assoc nausea/vomiting since Tuesday.  Status post cystectomy in 2014 at [facility] with associated urostomy for history of bladder cancer.  No history of bowel obstruction.  She also states a ventral hernia to the right of the urostomy site.  Severity of symptoms is moderate.  Palpation makes pain worse.     Past Medical History:  Diagnosis Date  . DDD (degenerative disc disease) CERVICAL AND LUMBAR  . Diabetes mellitus   . Hematuria   . History of bladder cancer 12/25/2012  . Hyperlipidemia   . Hypertension   . IBD (inflammatory bowel disease)   . Prolapse of vaginal vault after hysterectomy 12/25/2012  . PVC's (premature ventricular contractions)    a. Holter 2017: NSR, rare ventricular ectopy; palpitations correlated with NSR.  Marland Kitchen Urgency of urination     Patient Active Problem List   Diagnosis Date Noted  . Medication management 01/16/2018  . Dyslipidemia 03/25/2016  . Palpitations 03/25/2016  . ACE inhibitor intolerance 03/25/2016  . Anxiety 03/25/2016  . History of bladder cancer 12/25/2012  . Prolapse of vaginal vault after hysterectomy 12/25/2012  . Postmenopausal bleeding 04/17/2012  . Breast mass in female 02/26/2011  . Essential hypertension 02/26/2011  . IBD (inflammatory bowel disease) 02/26/2011  . 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 Low back pain 02/20/2011  . Hip pain, right 02/20/2011    Past Surgical History:  Procedure Laterality Date  . ABDOMINAL HYSTERECTOMY    . BREAST BIOPSY  03-20-2011  DR STRECK   BENIGN LEFT BREAST MASS  . BUNIONECTOMY  1980'S  . CYSTOSCOPY/RETROGRADE/URETEROSCOPY  05/29/2012   Procedure:  CYSTOSCOPY/RETROGRADE/URETEROSCOPY;  Surgeon: Claybon Jabs, MD;  Location[facility];  Service: Urology;  Laterality: Right;   right  Retrograde Pyelogram   . HYSTEROSCOPY W/D&C  04/17/2012   Procedure: DILATATION AND CURETTAGE /HYSTEROSCOPY;  Surgeon: Jonnie Kind, MD;  Location: [facility];  Service: Gynecology;  Laterality: N/A;  POLYPECTOMY  . TRANSURETHRAL RESECTION OF BLADDER TUMOR  05/29/2012   Procedure: TRANSURETHRAL RESECTION OF BLADDER TUMOR (TURBT);  Surgeon: Claybon Jabs, MD;  Location: [facility];  Service: Urology;  Laterality: N/A;  [place]  . TUBAL LIGATION  1984     OB History    Gravida  3   Para  3   Term      Preterm      AB      Living        SAB      TAB      Ectopic      Multiple      Live Births               Home Medications    Prior to Admission medications   Medication Sig Start Date End Date Taking? Authorizing Provider  albuterol (PROVENTIL HFA;VENTOLIN HFA) 108 (90 Base) MCG/ACT inhaler Inhale 2 puffs into the lungs every 6 (six) hours as needed for wheezing or shortness of breath.    [provider]  amLODipine (NORVASC) 10 MG tablet Take 10 mg by mouth daily with breakfast.    [provider]  aspirin EC  81 MG tablet Take 81 mg by mouth daily.     [provider]  cetirizine (ZYRTEC) 10 MG tablet Take 10 mg by mouth daily.    [provider]  chlorthalidone (HYGROTON) 25 MG tablet TAKE (1) TABLET BY MOUTH AT BEDTIME. 06/12/18   Branch, Alphonse Guild, MD  hydrALAZINE (APRESOLINE) 100 MG tablet Take 1 tablet (100 mg total) by mouth 3 (three) times daily. 03/04/18   Arnoldo Lenis, MD  LORazepam (ATIVAN) 1 MG tablet Take 1 mg by mouth every 8 (eight) hours as needed for anxiety.    [provider]  Mesalamine (ASACOL HD) 800 MG TBEC Take 1 tablet by mouth 2 (two) times daily.    [provider]  metFORMIN (GLUCOPHAGE-XR) 500 MG 24 hr tablet Take 500 mg  by mouth every evening.     [provider]  metoprolol tartrate (LOPRESSOR) 50 MG tablet TAKE (1) TABLET BY MOUTH TWICE DAILY. 09/29/18   Dunn, Nedra Hai, PA-C  Multiple Vitamins-Minerals (CENTRUM SILVER PO) Take 1 tablet by mouth daily.     [provider]  pantoprazole (PROTONIX) 40 MG tablet  02/23/18   [provider]  potassium chloride SA (K-DUR,KLOR-CON) 20 MEQ tablet TAKE 2 TABLETS BY MOUTH EACH MORNING AND 1 TABLET IN THE EVENING. 09/29/18   Dunn, Dayna N, PA-C  pravastatin (PRAVACHOL) 40 MG tablet Take 40 mg by mouth every morning.     [provider]  umeclidinium-vilanterol (ANORO ELLIPTA) 62.5-25 MCG/INH AEPB Inhale 1 puff into the lungs daily.    [provider]    Family History Family History  Problem Relation Age of Onset  . Heart disease Mother        Heart attack and stroke age 22  . COPD Father   . Cancer Sister        breast   . Diabetes Brother   . Cancer Brother        prostate  . Heart attack Sister        62  . Heart disease Sister 67       Defibrillator    Social History Social History   Tobacco Use  . Smoking status: Former Smoker    Years: 10.00    Types: Cigarettes    Last attempt to quit: 05/26/1988    Years since quitting: 30.5  . Smokeless tobacco: Never Used  Substance Use Topics  . Alcohol use: No  . Drug use: No     Allergies   Lisinopril and Ceftin [cefuroxime axetil]   Review of Systems Review of Systems  All other systems reviewed and are negative.    Physical Exam Updated Vital Signs BP (!) 133/51   Pulse 69   Temp 98.5 F (36.9 C) (Oral)   Resp 18   Ht 5\' 5"  (1.651 m)   Wt 74.8 kg   SpO2 92%   BMI 27.46 kg/m   Physical Exam Vitals signs and nursing note reviewed.  Constitutional:      Appearance: She is well-developed.  HENT:     Head: Normocephalic and atraumatic.  Eyes:     Conjunctiva/sclera: Conjunctivae normal.  Neck:     Musculoskeletal: Neck supple.   Cardiovascular:     Rate and Rhythm: Normal rate and regular rhythm.  Pulmonary:     Effort: Pulmonary effort is normal.     Breath sounds: Normal breath sounds.  Abdominal:     General: Bowel sounds are normal.     Comments:  Obvious urostomy in lower abdomen; ventral hernia to the right of urostomy.  Generalized tenderness.  Musculoskeletal: Normal range of motion.  Skin:    General: Skin is warm and dry.  Neurological:     Mental Status: She is alert and oriented to person, place, and time.  Psychiatric:        Behavior: Behavior normal.      ED Treatments / Results  Labs (all labs ordered are listed, but only abnormal results are displayed) Labs Reviewed  COMPREHENSIVE METABOLIC PANEL - Abnormal; Notable for the following components:      Result Value   Potassium 2.6 (*)    Chloride 97 (*)    Glucose, Bld 202 (*)    BUN 24 (*)    All other components within normal limits  CBC - Abnormal; Notable for the following components:   WBC 14.7 (*)    All other components within normal limits  URINALYSIS, ROUTINE W REFLEX MICROSCOPIC - Abnormal; Notable for the following components:   APPearance HAZY (*)    Protein, ur 100 (*)    Leukocytes,Ua SMALL (*)    Bacteria, UA RARE (*)    All other components within normal limits  LIPASE, BLOOD    EKG None  Radiology Ct Abdomen Pelvis W Contrast  Result Date: 12/17/2018 CLINICAL DATA:  Abdominal pain with nausea and vomiting EXAM: CT ABDOMEN AND PELVIS WITH CONTRAST TECHNIQUE: Multidetector CT imaging of the abdomen and pelvis was performed using the standard protocol following bolus administration of intravenous contrast. CONTRAST:  119mL OMNIPAQUE IOHEXOL 300 MG/ML  SOLN COMPARISON:  06/10/2012 FINDINGS: Lower chest: Lung bases demonstrate emphysematous disease. No acute consolidation or effusion. Borderline to mild cardiomegaly. Hepatobiliary: Hypodensity at the dome of the liver measuring 18 mm, with increased internal density  values. Additional subcentimeter hypodensities in the liver too small to further characterize. No calcified gallstone or biliary dilatation Pancreas: Unremarkable. No pancreatic ductal dilatation or surrounding inflammatory changes. Spleen: Normal in size without focal abnormality. Adrenals/Urinary Tract: Adrenal glands appear within normal limits. Interval cystectomy. Multiple subcentimeter hypodense cortical lesions in the kidneys, too small to further characterize. Multifocal scarring within the left kidney. No hydronephrosis. No hydroureter. Urostomy to the right lower quadrant. Stomach/Bowel: Stomach nonenlarged. Multiple dilated loops of fluid-filled small bowel measuring up to 3.3 cm in the left lower quadrant. Beaked appearance of distal small bowel in the left low pelvis felt consistent with transition point. No colon wall thickening. Right lower quadrant large parastomal hernia containing mesenteric fat and colon. No obstruction. Vascular/Lymphatic: Extensive aortic atherosclerosis. No aneurysm. No significantly enlarged lymph nodes. Reproductive: Status post hysterectomy Other: Negative for free air or free fluid Musculoskeletal: No acute or suspicious osseous abnormality. IMPRESSION: 1. Multiple dilated loops of fluid-filled small bowel within the left abdomen and pelvis, consistent with mechanical small bowel obstruction, likely due to adhesions. Probable transition point within the low left pelvis just cephalad to the level of left superior pubic ramus. 2. Interval cystectomy and creation of right lower quadrant urinary diversion. Moderate to large right parastomal hernia containing mesenteric fat and colon but no evidence for obstruction or strangulation. 3. Interim finding of multifocal cortical scarring within the left kidney 4. Emphysematous disease 5. Indeterminate hypodensity at the dome of the liver. This is in the region of a previously noted cyst but now appears smaller in size with increased  internal density. Electronically Signed   By: Donavan Foil M.D.   On: 12/17/2018 22:35    Procedures Procedures (  including critical care time)  Medications Ordered in ED Medications  sodium chloride flush (NS) 0.9 % injection 3 mL (3 mLs Intravenous Not Given 12/17/18 2246)  potassium chloride 10 mEq in 100 mL IVPB (10 mEq Intravenous New Bag/Given 12/17/18 2244)  sodium chloride 0.9 % bolus 500 mL (500 mLs Intravenous New Bag/Given 12/17/18 2136)  ondansetron (ZOFRAN) injection 4 mg (4 mg Intravenous Given 12/17/18 2137)  morphine 4 MG/ML injection 4 mg (4 mg Intravenous Given 12/17/18 2136)  iohexol (OMNIPAQUE) 300 MG/ML solution 100 mL (100 mLs Intravenous Contrast Given 12/17/18 2201)     Initial Impression / Assessment and Plan / ED Course  I have reviewed the triage vital signs and the nursing notes.  Pertinent labs & imaging results that were available during my care of the patient were reviewed by me and considered in my medical decision making (see chart for details).        Patient with a known history of bladder cancer with subsequent cystectomy and urostomy presents with abdominal pain with nausea and vomiting.  White count 14.7.  K2.6.  CT abdomen pelvis pending.  Potassium replaced intravenously.  Discussed with Dr. Dayna Barker.  CRITICAL CARE Performed by: Nat Christen Total critical care time: 30 minutes Critical care time was exclusive of separately billable procedures and treating other patients. Critical care was necessary to treat or prevent imminent or life-threatening deterioration. Critical care was time spent personally by me on the following activities: development of treatment plan with patient and/or surrogate as well as nursing, discussions with consultants, evaluation of patient's response to treatment, examination of patient, obtaining history from patient or surrogate, ordering and performing treatments and interventions, ordering and review of laboratory studies,  ordering and review of radiographic studies, pulse oximetry and re-evaluation of patient's condition.  Final Clinical Impressions(s) / ED Diagnoses   Final diagnoses:  Abdominal pain, unspecified abdominal location  Presence of urostomy ([place])  Ventral hernia without obstruction or gangrene  Hypokalemia  SBO (small bowel obstruction) [facility])    ED Discharge Orders    None       Nat Christen, MD 12/17/18 2252    Nat Christen, MD 12/17/18 [phone]

## 2018-12-18 ENCOUNTER — Observation Stay (HOSPITAL_COMMUNITY): Payer: Medicare Other

## 2018-12-18 DIAGNOSIS — K5651 Intestinal adhesions [bands], with partial obstruction: Secondary | ICD-10-CM | POA: Diagnosis present

## 2018-12-18 DIAGNOSIS — Z803 Family history of malignant neoplasm of breast: Secondary | ICD-10-CM | POA: Diagnosis not present

## 2018-12-18 DIAGNOSIS — Z90710 Acquired absence of both cervix and uterus: Secondary | ICD-10-CM | POA: Diagnosis not present

## 2018-12-18 DIAGNOSIS — Z8249 Family history of ischemic heart disease and other diseases of the circulatory system: Secondary | ICD-10-CM | POA: Diagnosis not present

## 2018-12-18 DIAGNOSIS — Z8042 Family history of malignant neoplasm of prostate: Secondary | ICD-10-CM | POA: Diagnosis not present

## 2018-12-18 DIAGNOSIS — K5669 Other partial intestinal obstruction: Secondary | ICD-10-CM | POA: Diagnosis not present

## 2018-12-18 DIAGNOSIS — Z8551 Personal history of malignant neoplasm of bladder: Secondary | ICD-10-CM | POA: Diagnosis not present

## 2018-12-18 DIAGNOSIS — I340 Nonrheumatic mitral (valve) insufficiency: Secondary | ICD-10-CM | POA: Diagnosis present

## 2018-12-18 DIAGNOSIS — Z825 Family history of asthma and other chronic lower respiratory diseases: Secondary | ICD-10-CM | POA: Diagnosis not present

## 2018-12-18 DIAGNOSIS — K56609 Unspecified intestinal obstruction, unspecified as to partial versus complete obstruction: Secondary | ICD-10-CM | POA: Diagnosis not present

## 2018-12-18 DIAGNOSIS — Z888 Allergy status to other drugs, medicaments and biological substances status: Secondary | ICD-10-CM | POA: Diagnosis not present

## 2018-12-18 DIAGNOSIS — Z936 Other artificial openings of urinary tract status: Secondary | ICD-10-CM | POA: Diagnosis not present

## 2018-12-18 DIAGNOSIS — Z1159 Encounter for screening for other viral diseases: Secondary | ICD-10-CM | POA: Diagnosis not present

## 2018-12-18 DIAGNOSIS — R112 Nausea with vomiting, unspecified: Secondary | ICD-10-CM | POA: Diagnosis not present

## 2018-12-18 DIAGNOSIS — E785 Hyperlipidemia, unspecified: Secondary | ICD-10-CM | POA: Diagnosis present

## 2018-12-18 DIAGNOSIS — Z87891 Personal history of nicotine dependence: Secondary | ICD-10-CM | POA: Diagnosis not present

## 2018-12-18 DIAGNOSIS — Z79899 Other long term (current) drug therapy: Secondary | ICD-10-CM | POA: Diagnosis not present

## 2018-12-18 DIAGNOSIS — E1122 Type 2 diabetes mellitus with diabetic chronic kidney disease: Secondary | ICD-10-CM | POA: Diagnosis present

## 2018-12-18 DIAGNOSIS — E876 Hypokalemia: Secondary | ICD-10-CM | POA: Diagnosis present

## 2018-12-18 DIAGNOSIS — N189 Chronic kidney disease, unspecified: Secondary | ICD-10-CM | POA: Diagnosis present

## 2018-12-18 DIAGNOSIS — Z932 Ileostomy status: Secondary | ICD-10-CM | POA: Diagnosis not present

## 2018-12-18 DIAGNOSIS — Z7951 Long term (current) use of inhaled steroids: Secondary | ICD-10-CM | POA: Diagnosis not present

## 2018-12-18 DIAGNOSIS — Z833 Family history of diabetes mellitus: Secondary | ICD-10-CM | POA: Diagnosis not present

## 2018-12-18 DIAGNOSIS — R109 Unspecified abdominal pain: Secondary | ICD-10-CM | POA: Diagnosis not present

## 2018-12-18 DIAGNOSIS — Z906 Acquired absence of other parts of urinary tract: Secondary | ICD-10-CM | POA: Diagnosis not present

## 2018-12-18 DIAGNOSIS — K529 Noninfective gastroenteritis and colitis, unspecified: Secondary | ICD-10-CM | POA: Diagnosis present

## 2018-12-18 DIAGNOSIS — Z7982 Long term (current) use of aspirin: Secondary | ICD-10-CM | POA: Diagnosis not present

## 2018-12-18 DIAGNOSIS — K439 Ventral hernia without obstruction or gangrene: Secondary | ICD-10-CM | POA: Diagnosis not present

## 2018-12-18 DIAGNOSIS — I129 Hypertensive chronic kidney disease with stage 1 through stage 4 chronic kidney disease, or unspecified chronic kidney disease: Secondary | ICD-10-CM | POA: Diagnosis present

## 2018-12-18 LAB — CBC
HCT: 38.5 % (ref 36.0–46.0)
Hemoglobin: 12 g/dL (ref 12.0–15.0)
MCH: 28.8 pg (ref 26.0–34.0)
MCHC: 31.2 g/dL (ref 30.0–36.0)
MCV: 92.3 fL (ref 80.0–100.0)
Platelets: 349 10*3/uL (ref 150–400)
RBC: 4.17 MIL/uL (ref 3.87–5.11)
RDW: 13.4 % (ref 11.5–15.5)
WBC: 12.7 10*3/uL — ABNORMAL HIGH (ref 4.0–10.5)
nRBC: 0 % (ref 0.0–0.2)

## 2018-12-18 LAB — BASIC METABOLIC PANEL
Anion gap: 9 (ref 5–15)
BUN: 18 mg/dL (ref 8–23)
CO2: 29 mmol/L (ref 22–32)
Calcium: 8.6 mg/dL — ABNORMAL LOW (ref 8.9–10.3)
Chloride: 100 mmol/L (ref 98–111)
Creatinine, Ser: 0.67 mg/dL (ref 0.44–1.00)
GFR calc Af Amer: >60 mL/min (ref >60)
GFR calc non Af Amer: >60 mL/min (ref >60)
Glucose, Bld: 165 mg/dL — ABNORMAL HIGH (ref 70–99)
Potassium: 2.8 mmol/L — ABNORMAL LOW (ref 3.5–5.1)
Sodium: 138 mmol/L (ref 135–145)

## 2018-12-18 LAB — CBG MONITORING, ED: Glucose-Capillary: 140 mg/dL — ABNORMAL HIGH (ref 70–99)

## 2018-12-18 LAB — SARS CORONAVIRUS 2 BY RT PCR (HOSPITAL ORDER, PERFORMED IN ~~LOC~~ HOSPITAL LAB): SARS Coronavirus 2: NEGATIVE

## 2018-12-18 LAB — GLUCOSE, CAPILLARY
Glucose-Capillary: 158 mg/dL — ABNORMAL HIGH (ref 70–99)
Glucose-Capillary: 157 mg/dL — ABNORMAL HIGH (ref 70–99)
Glucose-Capillary: 138 mg/dL — ABNORMAL HIGH (ref 70–99)
Glucose-Capillary: 131 mg/dL — ABNORMAL HIGH (ref 70–99)

## 2018-12-18 LAB — MAGNESIUM: Magnesium: 2.3 mg/dL (ref 1.7–2.4)

## 2018-12-18 MED ORDER — SODIUM CHLORIDE 0.9% FLUSH: 3.0000 mL | INTRAVENOUS | Status: DC | PRN

## 2018-12-18 MED ORDER — LORAZEPAM 2 MG/ML IJ SOLN
1.0000 mg | Freq: Four times a day (QID) | INTRAMUSCULAR | Status: DC | PRN
  Administered 2018-12-18 – 2018-12-21 (×9): 1 mg via INTRAVENOUS
  Filled 2018-12-18 (×9): qty 1

## 2018-12-18 MED ORDER — BISACODYL 10 MG RE SUPP
10.0000 mg | Freq: Once | RECTAL | Status: AC
  Administered 2018-12-18: 10 mg via RECTAL
  Filled 2018-12-18: qty 1

## 2018-12-18 MED ORDER — ALBUTEROL SULFATE (2.5 MG/3ML) 0.083% IN NEBU: 2.5000 mg | INHALATION_SOLUTION | RESPIRATORY_TRACT | Status: DC | PRN

## 2018-12-18 MED ORDER — HYDRALAZINE HCL 25 MG PO TABS
100.0000 mg | ORAL_TABLET | Freq: Three times a day (TID) | ORAL | Status: DC
  Administered 2018-12-18 – 2018-12-22 (×14): 100 mg via ORAL
  Filled 2018-12-18 (×14): qty 4

## 2018-12-18 MED ORDER — SODIUM CHLORIDE 0.9 % IV SOLN: 250.0000 mL | INTRAVENOUS | Status: DC | PRN

## 2018-12-18 MED ORDER — LABETALOL HCL 5 MG/ML IV SOLN: 10.0000 mg | INTRAVENOUS | Status: DC | PRN

## 2018-12-18 MED ORDER — POTASSIUM CHLORIDE 10 MEQ/100ML IV SOLN
10.0000 meq | INTRAVENOUS | Status: AC
  Administered 2018-12-18 (×6): 10 meq via INTRAVENOUS
  Filled 2018-12-18 (×6): qty 100

## 2018-12-18 MED ORDER — ACETAMINOPHEN 650 MG RE SUPP: 650.0000 mg | Freq: Four times a day (QID) | RECTAL | Status: DC | PRN

## 2018-12-18 MED ORDER — FAMOTIDINE IN NACL 20-0.9 MG/50ML-% IV SOLN
20.0000 mg | Freq: Two times a day (BID) | INTRAVENOUS | Status: DC
  Administered 2018-12-18 – 2018-12-22 (×10): 20 mg via INTRAVENOUS
  Filled 2018-12-18 (×10): qty 50

## 2018-12-18 MED ORDER — MORPHINE SULFATE (PF) 2 MG/ML IV SOLN
2.0000 mg | INTRAVENOUS | Status: AC | PRN
  Administered 2018-12-18 – 2018-12-20 (×6): 2 mg via INTRAVENOUS
  Filled 2018-12-18 (×6): qty 1

## 2018-12-18 MED ORDER — ONDANSETRON HCL 4 MG PO TABS: 4.0000 mg | ORAL_TABLET | Freq: Four times a day (QID) | ORAL | Status: DC | PRN

## 2018-12-18 MED ORDER — ONDANSETRON HCL 4 MG/2ML IJ SOLN
4.0000 mg | Freq: Four times a day (QID) | INTRAMUSCULAR | Status: DC | PRN
  Administered 2018-12-18 – 2018-12-19 (×2): 4 mg via INTRAVENOUS
  Filled 2018-12-18 (×2): qty 2

## 2018-12-18 MED ORDER — SODIUM CHLORIDE 0.9% FLUSH
3.0000 mL | Freq: Two times a day (BID) | INTRAVENOUS | Status: DC
  Administered 2018-12-18 – 2018-12-22 (×6): 3 mL via INTRAVENOUS

## 2018-12-18 MED ORDER — METOPROLOL TARTRATE 50 MG PO TABS
50.0000 mg | ORAL_TABLET | Freq: Two times a day (BID) | ORAL | Status: DC
  Administered 2018-12-18 – 2018-12-22 (×10): 50 mg via ORAL
  Filled 2018-12-18 (×10): qty 1

## 2018-12-18 MED ORDER — MAGNESIUM SULFATE 2 GM/50ML IV SOLN
2.0000 g | Freq: Once | INTRAVENOUS | Status: AC
  Administered 2018-12-18: 2 g via INTRAVENOUS
  Filled 2018-12-18: qty 50

## 2018-12-18 MED ORDER — PRAVASTATIN SODIUM 40 MG PO TABS
40.0000 mg | ORAL_TABLET | Freq: Every morning | ORAL | Status: DC
  Filled 2018-12-18: qty 1

## 2018-12-18 MED ORDER — ACETAMINOPHEN 325 MG PO TABS: 650.0000 mg | ORAL_TABLET | Freq: Four times a day (QID) | ORAL | Status: DC | PRN

## 2018-12-18 MED ORDER — AMLODIPINE BESYLATE 5 MG PO TABS
10.0000 mg | ORAL_TABLET | Freq: Every day | ORAL | Status: DC
  Administered 2018-12-18 – 2018-12-22 (×5): 10 mg via ORAL
  Filled 2018-12-18 (×6): qty 2

## 2018-12-18 MED ORDER — TRAZODONE HCL 50 MG PO TABS: 50.0000 mg | ORAL_TABLET | Freq: Every evening | ORAL | Status: DC | PRN

## 2018-12-18 MED ORDER — UMECLIDINIUM-VILANTEROL 62.5-25 MCG/INH IN AEPB
1.0000 | INHALATION_SPRAY | Freq: Every day | RESPIRATORY_TRACT | Status: DC
  Administered 2018-12-18 – 2018-12-22 (×5): 1 via RESPIRATORY_TRACT
  Filled 2018-12-18: qty 14

## 2018-12-18 MED ORDER — HEPARIN SODIUM (PORCINE) 5000 UNIT/ML IJ SOLN
5000.0000 [IU] | Freq: Three times a day (TID) | INTRAMUSCULAR | Status: DC
  Administered 2018-12-18 – 2018-12-22 (×13): 5000 [IU] via SUBCUTANEOUS
  Filled 2018-12-18 (×14): qty 1

## 2018-12-18 NOTE — Consult Note (Signed)
Reason for Consult: Small bowel obstruction Referring Physician: Dr. [name] is an 73 y.o. female.  HPI: Patient is a 73 year old black female who presents the emergency room with worsening nausea and vomiting.  She was seen in the emergency room and was found to have multiple dilated loops of small bowel with a possible transition zone in the left lower pelvic region.  She is status post cystectomy with ileal conduit reconstruction in 2013.  She does have a known parastomal hernia.  She states she has never been admitted in the past for bowel obstruction.  She did have an small bowel movement yesterday and one this morning after getting a Dulcolax suppository.  She was admitted last night for further evaluation treatment.  Currently, she states her pain is minimal.  She has passed some gas.  She is not nauseated.  An NG tube is in place.  Past Medical History:  Diagnosis Date  . DDD (degenerative disc disease) CERVICAL AND LUMBAR  . Diabetes mellitus   . Hematuria   . History of bladder cancer 12/25/2012  . Hyperlipidemia   . Hypertension   . IBD (inflammatory bowel disease)   . Prolapse of vaginal vault after hysterectomy 12/25/2012  . PVC's (premature ventricular contractions)    a. Holter 2017: NSR, rare ventricular ectopy; palpitations correlated with NSR.  Marland Kitchen Urgency of urination     Past Surgical History:  Procedure Laterality Date  . ABDOMINAL HYSTERECTOMY    . BREAST BIOPSY  03-20-2011  DR STRECK   BENIGN LEFT BREAST MASS  . BUNIONECTOMY  1980'S  . CYSTOSCOPY/RETROGRADE/URETEROSCOPY  05/29/2012   Procedure: CYSTOSCOPY/RETROGRADE/URETEROSCOPY;  Surgeon: Claybon Jabs, MD;  [facility];  Service: Urology;  Laterality: Right;   right  Retrograde Pyelogram   . HYSTEROSCOPY W/D&C  04/17/2012   Procedure: DILATATION AND CURETTAGE /HYSTEROSCOPY;  Surgeon: Jonnie Kind, MD;  Location: [facility];  Service: Gynecology;  Laterality: N/A;   POLYPECTOMY  . TRANSURETHRAL RESECTION OF BLADDER TUMOR  05/29/2012   Procedure: TRANSURETHRAL RESECTION OF BLADDER TUMOR (TURBT);  Surgeon: Claybon Jabs, MD;  Location[facility];  Service: Urology;  Laterality: N/A;  [place]  . TUBAL LIGATION  1984    Family History  Problem Relation Age of Onset  . Heart disease Mother        Heart attack and stroke age 56  . COPD Father   . Cancer Sister        breast   . Diabetes Brother   . Cancer Brother        prostate  . Heart attack Sister        86  . Heart disease Sister 59       Defibrillator    Social History:  reports that she quit smoking about 30 years ago. Her smoking use included cigarettes. She quit after 10.00 years of use. She has never used smokeless tobacco. She reports that she does not drink alcohol or use drugs.  Allergies:  Allergies  Allergen Reactions  . Lisinopril Swelling  . Ceftin [Cefuroxime Axetil] Rash    Medications: I have reviewed the patient's current medications.  Results for orders placed or performed during the hospital encounter of 12/17/18 (from the past 48 hour(s))  Lipase, blood     Status: None   Collection Time: 12/17/18  8:17 PM  Result Value Ref Range   Lipase 32 11 - 51 U/L    Comment: Performed at [facility], [address] [place], [place] [address]  Comprehensive metabolic panel     Status: Abnormal   Collection Time: 12/17/18  8:17 PM  Result Value Ref Range   Sodium 138 135 - 145 mmol/L   Potassium 2.6 (LL) 3.5 - 5.1 mmol/L    Comment: CRITICAL RESULT CALLED TO, READ BACK BY AND VERIFIED WITH: SAPPELT,J ON 12/17/18 AT 2130 BY LOY,C    Chloride 97 (L) 98 - 111 mmol/L   CO2 28 22 - 32 mmol/L   Glucose, Bld 202 (H) 70 - 99 mg/dL   BUN 24 (H) 8 - 23 mg/dL   Creatinine, Ser 0.92 0.44 - 1.00 mg/dL   Calcium 9.7 8.9 - 10.3 mg/dL   Total Protein 8.0 6.5 - 8.1 g/dL   Albumin 4.2 3.5 - 5.0 g/dL   AST 22 15 - 41 U/L   ALT 20 0 - 44 U/L   Alkaline Phosphatase 58 38  - 126 U/L   Total Bilirubin 0.6 0.3 - 1.2 mg/dL   GFR calc non Af Amer >60 >60 mL/min   GFR calc Af Amer >60 >60 mL/min   Anion gap 13 5 - 15    Comment: Performed at [facility], [address], [place], [place] [address]  CBC     Status: Abnormal   Collection Time: 12/17/18  8:17 PM  Result Value Ref Range   WBC 14.7 (H) 4.0 - 10.5 K/uL   RBC 4.51 3.87 - 5.11 MIL/uL   Hemoglobin 13.1 12.0 - 15.0 g/dL   HCT 40.8 36.0 - 46.0 %   MCV 90.5 80.0 - 100.0 fL   MCH 29.0 26.0 - 34.0 pg   MCHC 32.1 30.0 - 36.0 g/dL   RDW 13.5 11.5 - 15.5 %   Platelets 400 150 - 400 K/uL   nRBC 0.0 0.0 - 0.2 %    Comment: Performed at [facility], [address] [place], [place] [address]  Urinalysis, Routine w reflex microscopic     Status: Abnormal   Collection Time: 12/17/18  9:00 PM  Result Value Ref Range   Color, Urine YELLOW YELLOW   APPearance HAZY (A) CLEAR   Specific Gravity, Urine 1.017 1.005 - 1.030   pH 6.0 5.0 - 8.0   Glucose, UA NEGATIVE NEGATIVE mg/dL   Hgb urine dipstick NEGATIVE NEGATIVE   Bilirubin Urine NEGATIVE NEGATIVE   Ketones, ur NEGATIVE NEGATIVE mg/dL   Protein, ur 100 (A) NEGATIVE mg/dL   Nitrite NEGATIVE NEGATIVE   Leukocytes,Ua SMALL (A) NEGATIVE   RBC / HPF 0-5 0 - 5 RBC/hpf   WBC, UA 21-50 0 - 5 WBC/hpf   Bacteria, UA RARE (A) NONE SEEN   Mucus PRESENT    Hyaline Casts, UA PRESENT     Comment: Performed at [facility], [address] [place], [place] [address]  SARS Coronavirus 2 (CEPHEID - Performed in [place] hospital lab), Hosp Order     Status: None   Collection Time: 12/17/18 11:21 PM  Result Value Ref Range   SARS Coronavirus 2 NEGATIVE NEGATIVE    Comment: (NOTE) If result is NEGATIVE SARS-CoV-2 target nucleic acids are NOT DETECTED. The SARS-CoV-2 RNA is generally detectable in upper and lower  respiratory specimens during the acute phase of infection. The lowest  concentration of SARS-CoV-2 viral copies this assay can detect is 250  copies / mL.  A negative result does not preclude SARS-CoV-2 infection  and should not be used as the sole basis for treatment  or other  patient management decisions.  A negative result may occur with  improper specimen collection / handling, submission of specimen other  than nasopharyngeal swab, presence of viral mutation(s) within the  areas targeted by this assay, and inadequate number of viral copies  (<250 copies / mL). A negative result must be combined with clinical  observations, patient history, and epidemiological information. If result is POSITIVE SARS-CoV-2 target nucleic acids are DETECTED. The SARS-CoV-2 RNA is generally detectable in upper and lower  respiratory specimens dur ing the acute phase of infection.  Positive  results are indicative of active infection with SARS-CoV-2.  Clinical  correlation with patient history and other diagnostic information is  necessary to determine patient infection status.  Positive results do  not rule out bacterial infection or co-infection with other viruses. If result is PRESUMPTIVE POSTIVE SARS-CoV-2 nucleic acids MAY BE PRESENT.   A presumptive positive result was obtained on the submitted specimen  and confirmed on repeat testing.  While 2019 novel coronavirus  (SARS-CoV-2) nucleic acids may be present in the submitted sample  additional confirmatory testing may be necessary for epidemiological  and / or clinical management purposes  to differentiate between  SARS-CoV-2 and other Sarbecovirus currently known to infect humans.  If clinically indicated additional testing with an alternate test  methodology [phone]) is advised. The SARS-CoV-2 RNA is generally  detectable in upper and lower respiratory sp ecimens during the acute  phase of infection. The expected result is Negative. Fact Sheet for Patients:  StrictlyIdeas.no Fact Sheet for Healthcare Providers: BankingDealers.co.za This test is not  yet approved or cleared by the [place] FDA and has been authorized for detection and/or diagnosis of SARS-CoV-2 by FDA under an Emergency Use Authorization (EUA).  This EUA will remain in effect (meaning this test can be used) for the duration of the COVID-19 declaration under Section 564(b)(1) of the Act, 21 U.S.C. section 360bbb-3(b)(1), unless the authorization is terminated or revoked sooner. Performed at [facility], [address], [place], [place] [address]   CBG monitoring, ED     Status: Abnormal   Collection Time: 12/18/18 12:58 AM  Result Value Ref Range   Glucose-Capillary 140 (H) 70 - 99 mg/dL  Basic metabolic panel     Status: Abnormal   Collection Time: 12/18/18  6:28 AM  Result Value Ref Range   Sodium 138 135 - 145 mmol/L   Potassium 2.8 (L) 3.5 - 5.1 mmol/L   Chloride 100 98 - 111 mmol/L   CO2 29 22 - 32 mmol/L   Glucose, Bld 165 (H) 70 - 99 mg/dL   BUN 18 8 - 23 mg/dL   Creatinine, Ser 0.67 0.44 - 1.00 mg/dL   Calcium 8.6 (L) 8.9 - 10.3 mg/dL   GFR calc non Af Amer >60 >60 mL/min   GFR calc Af Amer >60 >60 mL/min   Anion gap 9 5 - 15    Comment: Performed at [facility], [address], [place], [place] [address]  CBC     Status: Abnormal   Collection Time: 12/18/18  6:28 AM  Result Value Ref Range   WBC 12.7 (H) 4.0 - 10.5 K/uL   RBC 4.17 3.87 - 5.11 MIL/uL   Hemoglobin 12.0 12.0 - 15.0 g/dL   HCT 38.5 36.0 - 46.0 %   MCV 92.3 80.0 - 100.0 fL   MCH 28.8 26.0 - 34.0 pg   MCHC 31.2 30.0 - 36.0 g/dL   RDW 13.4 11.5 - 15.5 %  Platelets 349 150 - 400 K/uL   nRBC 0.0 0.0 - 0.2 %    Comment: Performed at [facility], [address] [place], [place] [address]  Magnesium     Status: None   Collection Time: 12/18/18  6:28 AM  Result Value Ref Range   Magnesium 2.3 1.7 - 2.4 mg/dL    Comment: Performed at [facility], [address] [place], [place] [address]  Glucose, capillary     Status: Abnormal   Collection Time: 12/18/18  8:45 AM  Result Value  Ref Range   Glucose-Capillary 158 (H) 70 - 99 mg/dL  Glucose, capillary     Status: Abnormal   Collection Time: 12/18/18 11:07 AM  Result Value Ref Range   Glucose-Capillary 157 (H) 70 - 99 mg/dL    Ct Abdomen Pelvis W Contrast  Result Date: 12/17/2018 CLINICAL DATA:  Abdominal pain with nausea and vomiting EXAM: CT ABDOMEN AND PELVIS WITH CONTRAST TECHNIQUE: Multidetector CT imaging of the abdomen and pelvis was performed using the standard protocol following bolus administration of intravenous contrast. CONTRAST:  178mL OMNIPAQUE IOHEXOL 300 MG/ML  SOLN COMPARISON:  06/10/2012 FINDINGS: Lower chest: Lung bases demonstrate emphysematous disease. No acute consolidation or effusion. Borderline to mild cardiomegaly. Hepatobiliary: Hypodensity at the dome of the liver measuring 18 mm, with increased internal density values. Additional subcentimeter hypodensities in the liver too small to further characterize. No calcified gallstone or biliary dilatation Pancreas: Unremarkable. No pancreatic ductal dilatation or surrounding inflammatory changes. Spleen: Normal in size without focal abnormality. Adrenals/Urinary Tract: Adrenal glands appear within normal limits. Interval cystectomy. Multiple subcentimeter hypodense cortical lesions in the kidneys, too small to further characterize. Multifocal scarring within the left kidney. No hydronephrosis. No hydroureter. Urostomy to the right lower quadrant. Stomach/Bowel: Stomach nonenlarged. Multiple dilated loops of fluid-filled small bowel measuring up to 3.3 cm in the left lower quadrant. Beaked appearance of distal small bowel in the left low pelvis felt consistent with transition point. No colon wall thickening. Right lower quadrant large parastomal hernia containing mesenteric fat and colon. No obstruction. Vascular/Lymphatic: Extensive aortic atherosclerosis. No aneurysm. No significantly enlarged lymph nodes. Reproductive: Status post hysterectomy Other: Negative  for free air or free fluid Musculoskeletal: No acute or suspicious osseous abnormality. IMPRESSION: 1. Multiple dilated loops of fluid-filled small bowel within the left abdomen and pelvis, consistent with mechanical small bowel obstruction, likely due to adhesions. Probable transition point within the low left pelvis just cephalad to the level of left superior pubic ramus. 2. Interval cystectomy and creation of right lower quadrant urinary diversion. Moderate to large right parastomal hernia containing mesenteric fat and colon but no evidence for obstruction or strangulation. 3. Interim finding of multifocal cortical scarring within the left kidney 4. Emphysematous disease 5. Indeterminate hypodensity at the dome of the liver. This is in the region of a previously noted cyst but now appears smaller in size with increased internal density. Electronically Signed   By: Donavan Foil M.D.   On: 12/17/2018 22:35   Dg Abd Portable 1 View  Result Date: 12/18/2018 CLINICAL DATA:  74 year old female with abdominal pain nausea and vomiting. EXAM: PORTABLE ABDOMEN - 1 VIEW COMPARISON:  CT Abdomen and Pelvis yesterday FINDINGS: Portable AP supine view at 0022 hours. Enteric tube has been placed with side hole at the level of the gastric body. Persistent small bowel obstruction gas pattern with gas-filled loops dilated up to 43 millimeters. Stable gas pattern since yesterday. Stable lung bases. No definite pneumoperitoneum on this  supine view. IMPRESSION: 1. Enteric tube placed, side hole at the level of the gastric body. 2. Stable small bowel obstruction gas pattern. Electronically Signed   By: Genevie Ann M.D.   On: 12/18/2018 00:55    ROS:  Pertinent items are noted in HPI.  Blood pressure (!) 157/68, pulse 74, temperature 99.3 F (37.4 C), temperature source Oral, resp. rate 17, height 5\' 5"  (1.651 m), weight 72.4 kg, SpO2 91 %. Physical Exam: Pleasant black female no acute distress Head is normocephalic,  atraumatic Lungs are clear to auscultation with good breath sounds bilaterally Heart examination reveals regular rate and rhythm without S3, S4, murmurs Abdomen is soft with occasional bowel sounds appreciated.  A right lower quadrant ostomy is present.  No rigidity is noted.  CT scan images personally reviewed  Assessment/Plan: Impression: Partial small bowel obstruction most likely secondary to adhesive disease, hypokalemia.  Her abdominal pain seems to have eased.  She has not had much output from her NG tube. Plan: Continue correcting her potassium.  Continue NG tube decompression.  No need for acute surgical invention at this time.  Will follow with you.  April Gomez 12/18/2018, 12:50 PM

## 2018-12-18 NOTE — Progress Notes (Signed)
Subjective: She was admitted last night with small bowel obstruction.  She has multiple other problems including history of bladder cancer and she has had surgery for that so is likely from adhesions.  She is anxious and worried about getting her medications.  She apparently had a suppository this morning and had some bowel movement but still complains of some abdominal pain  Objective: Vital signs in last 24 hours: Temp:  [98.5 F (36.9 C)-99.3 F (37.4 C)] 99.3 F (37.4 C) (05/22 0530) Pulse Rate:  [69-77] 74 (05/22 0530) Resp:  [17-18] 17 (05/22 0530) BP: (133-157)/(51-83) 157/68 (05/22 0530) SpO2:  [90 %-97 %] 90 % (05/22 0530) Weight:  [72.4 kg-74.8 kg] 72.4 kg (05/22 0200) Weight change:  Last BM Date: 12/16/18  Intake/Output from previous day: 05/21 0701 - 05/22 0700 In: 1442.4 [I.V.:543.7; IV Piggyback:898.7] Out: 400 [Urine:400]  PHYSICAL EXAM General appearance: alert, cooperative and mild distress Resp: clear to auscultation bilaterally Cardio: regular rate and rhythm, S1, S2 normal, no murmur, click, rub or gallop GI: She has some epigastric tenderness bowel sounds are sluggish Extremities: extremities normal, atraumatic, no cyanosis or edema  Lab Results:  Results for orders placed or performed during the hospital encounter of 12/17/18 (from the past 48 hour(s))  Lipase, blood     Status: None   Collection Time: 12/17/18  8:17 PM  Result Value Ref Range   Lipase 32 11 - 51 U/L    Comment: Performed at [facility], [address], [place], [place] [address]  Comprehensive metabolic panel     Status: Abnormal   Collection Time: 12/17/18  8:17 PM  Result Value Ref Range   Sodium 138 135 - 145 mmol/L   Potassium 2.6 (LL) 3.5 - 5.1 mmol/L    Comment: CRITICAL RESULT CALLED TO, READ BACK BY AND VERIFIED WITH: SAPPELT,J ON 12/17/18 AT 2130 BY LOY,C    Chloride 97 (L) 98 - 111 mmol/L   CO2 28 22 - 32 mmol/L   Glucose, Bld 202 (H) 70 - 99 mg/dL   BUN 24 (H) 8 - 23  mg/dL   Creatinine, Ser 0.92 0.44 - 1.00 mg/dL   Calcium 9.7 8.9 - 10.3 mg/dL   Total Protein 8.0 6.5 - 8.1 g/dL   Albumin 4.2 3.5 - 5.0 g/dL   AST 22 15 - 41 U/L   ALT 20 0 - 44 U/L   Alkaline Phosphatase 58 38 - 126 U/L   Total Bilirubin 0.6 0.3 - 1.2 mg/dL   GFR calc non Af Amer >60 >60 mL/min   GFR calc Af Amer >60 >60 mL/min   Anion gap 13 5 - 15    Comment: Performed at [facility], [address], [place], [place] [address]  CBC     Status: Abnormal   Collection Time: 12/17/18  8:17 PM  Result Value Ref Range   WBC 14.7 (H) 4.0 - 10.5 K/uL   RBC 4.51 3.87 - 5.11 MIL/uL   Hemoglobin 13.1 12.0 - 15.0 g/dL   HCT 40.8 36.0 - 46.0 %   MCV 90.5 80.0 - 100.0 fL   MCH 29.0 26.0 - 34.0 pg   MCHC 32.1 30.0 - 36.0 g/dL   RDW 13.5 11.5 - 15.5 %   Platelets 400 150 - 400 K/uL   nRBC 0.0 0.0 - 0.2 %    Comment: Performed at [facility], [address] [place], [place] [address]  Urinalysis, Routine w reflex microscopic     Status: Abnormal  Collection Time: 12/17/18  9:00 PM  Result Value Ref Range   Color, Urine YELLOW YELLOW   APPearance HAZY (A) CLEAR   Specific Gravity, Urine 1.017 1.005 - 1.030   pH 6.0 5.0 - 8.0   Glucose, UA NEGATIVE NEGATIVE mg/dL   Hgb urine dipstick NEGATIVE NEGATIVE   Bilirubin Urine NEGATIVE NEGATIVE   Ketones, ur NEGATIVE NEGATIVE mg/dL   Protein, ur 100 (A) NEGATIVE mg/dL   Nitrite NEGATIVE NEGATIVE   Leukocytes,Ua SMALL (A) NEGATIVE   RBC / HPF 0-5 0 - 5 RBC/hpf   WBC, UA 21-50 0 - 5 WBC/hpf   Bacteria, UA RARE (A) NONE SEEN   Mucus PRESENT    Hyaline Casts, UA PRESENT     Comment: Performed at [facility], [address], [place], [place] [address]  SARS Coronavirus 2 (CEPHEID - Performed in [facility] hospital lab), Hosp Order     Status: None   Collection Time: 12/17/18 11:21 PM  Result Value Ref Range   SARS Coronavirus 2 NEGATIVE NEGATIVE    Comment: (NOTE) If result is NEGATIVE SARS-CoV-2 target nucleic acids are NOT  DETECTED. The SARS-CoV-2 RNA is generally detectable in upper and lower  respiratory specimens during the acute phase of infection. The lowest  concentration of SARS-CoV-2 viral copies this assay can detect is 250  copies / mL. A negative result does not preclude SARS-CoV-2 infection  and should not be used as the sole basis for treatment or other  patient management decisions.  A negative result may occur with  improper specimen collection / handling, submission of specimen other  than nasopharyngeal swab, presence of viral mutation(s) within the  areas targeted by this assay, and inadequate number of viral copies  (<250 copies / mL). A negative result must be combined with clinical  observations, patient history, and epidemiological information. If result is POSITIVE SARS-CoV-2 target nucleic acids are DETECTED. The SARS-CoV-2 RNA is generally detectable in upper and lower  respiratory specimens dur ing the acute phase of infection.  Positive  results are indicative of active infection with SARS-CoV-2.  Clinical  correlation with patient history and other diagnostic information is  necessary to determine patient infection status.  Positive results do  not rule out bacterial infection or co-infection with other viruses. If result is PRESUMPTIVE POSTIVE SARS-CoV-2 nucleic acids MAY BE PRESENT.   A presumptive positive result was obtained on the submitted specimen  and confirmed on repeat testing.  While 2019 novel coronavirus  (SARS-CoV-2) nucleic acids may be present in the submitted sample  additional confirmatory testing may be necessary for epidemiological  and / or clinical management purposes  to differentiate between  SARS-CoV-2 and other Sarbecovirus currently known to infect humans.  If clinically indicated additional testing with an alternate test  methodology [phone]) is advised. The SARS-CoV-2 RNA is generally  detectable in upper and lower respiratory sp ecimens during  the acute  phase of infection. The expected result is Negative. Fact Sheet for Patients:  StrictlyIdeas.no Fact Sheet for Healthcare Providers: BankingDealers.co.za This test is not yet approved or cleared by the [place] FDA and has been authorized for detection and/or diagnosis of SARS-CoV-2 by FDA under an Emergency Use Authorization (EUA).  This EUA will remain in effect (meaning this test can be used) for the duration of the COVID-19 declaration under Section 564(b)(1) of the Act, 21 U.S.C. section 360bbb-3(b)(1), unless the authorization is terminated or revoked sooner. Performed at [facility], [address], [place], [place]  [address]   CBG monitoring, ED     Status: Abnormal   Collection Time: 12/18/18 12:58 AM  Result Value Ref Range   Glucose-Capillary 140 (H) 70 - 99 mg/dL  Basic metabolic panel     Status: Abnormal   Collection Time: 12/18/18  6:28 AM  Result Value Ref Range   Sodium 138 135 - 145 mmol/L   Potassium 2.8 (L) 3.5 - 5.1 mmol/L   Chloride 100 98 - 111 mmol/L   CO2 29 22 - 32 mmol/L   Glucose, Bld 165 (H) 70 - 99 mg/dL   BUN 18 8 - 23 mg/dL   Creatinine, Ser 0.67 0.44 - 1.00 mg/dL   Calcium 8.6 (L) 8.9 - 10.3 mg/dL   GFR calc non Af Amer >60 >60 mL/min   GFR calc Af Amer >60 >60 mL/min   Anion gap 9 5 - 15    Comment: Performed at [facility], [address], [place], [place] [address]  CBC     Status: Abnormal   Collection Time: 12/18/18  6:28 AM  Result Value Ref Range   WBC 12.7 (H) 4.0 - 10.5 K/uL   RBC 4.17 3.87 - 5.11 MIL/uL   Hemoglobin 12.0 12.0 - 15.0 g/dL   HCT 38.5 36.0 - 46.0 %   MCV 92.3 80.0 - 100.0 fL   MCH 28.8 26.0 - 34.0 pg   MCHC 31.2 30.0 - 36.0 g/dL   RDW 13.4 11.5 - 15.5 %   Platelets 349 150 - 400 K/uL   nRBC 0.0 0.0 - 0.2 %    Comment: Performed at [facility], [address], [place], [place] [address]  Magnesium     Status: None   Collection Time: 12/18/18  6:28 AM   Result Value Ref Range   Magnesium 2.3 1.7 - 2.4 mg/dL    Comment: Performed at [facility], [address], [place], [place] [address]    ABGS No results for input(s): PHART, PO2ART, TCO2, HCO3 in the last 72 hours.  Invalid input(s): PCO2 CULTURES Recent Results (from the past 240 hour(s))  SARS Coronavirus 2 (CEPHEID - Performed in [facility] hospital lab), Hosp Order     Status: None   Collection Time: 12/17/18 11:21 PM  Result Value Ref Range Status   SARS Coronavirus 2 NEGATIVE NEGATIVE Final    Comment: (NOTE) If result is NEGATIVE SARS-CoV-2 target nucleic acids are NOT DETECTED. The SARS-CoV-2 RNA is generally detectable in upper and lower  respiratory specimens during the acute phase of infection. The lowest  concentration of SARS-CoV-2 viral copies this assay can detect is 250  copies / mL. A negative result does not preclude SARS-CoV-2 infection  and should not be used as the sole basis for treatment or other  patient management decisions.  A negative result may occur with  improper specimen collection / handling, submission of specimen other  than nasopharyngeal swab, presence of viral mutation(s) within the  areas targeted by this assay, and inadequate number of viral copies  (<250 copies / mL). A negative result must be combined with clinical  observations, patient history, and epidemiological information. If result is POSITIVE SARS-CoV-2 target nucleic acids are DETECTED. The SARS-CoV-2 RNA is generally detectable in upper and lower  respiratory specimens dur ing the acute phase of infection.  Positive  results are indicative of active infection with SARS-CoV-2.  Clinical  correlation with patient history and other diagnostic information is  necessary to determine patient infection status.  Positive results  do  not rule out bacterial infection or co-infection with other viruses. If result is PRESUMPTIVE POSTIVE SARS-CoV-2 nucleic acids MAY BE PRESENT.   A  presumptive positive result was obtained on the submitted specimen  and confirmed on repeat testing.  While 2019 novel coronavirus  (SARS-CoV-2) nucleic acids may be present in the submitted sample  additional confirmatory testing may be necessary for epidemiological  and / or clinical management purposes  to differentiate between  SARS-CoV-2 and other Sarbecovirus currently known to infect humans.  If clinically indicated additional testing with an alternate test  methodology [phone]) is advised. The SARS-CoV-2 RNA is generally  detectable in upper and lower respiratory sp ecimens during the acute  phase of infection. The expected result is Negative. Fact Sheet for Patients:  StrictlyIdeas.no Fact Sheet for Healthcare Providers: BankingDealers.co.za This test is not yet approved or cleared by the [place] FDA and has been authorized for detection and/or diagnosis of SARS-CoV-2 by FDA under an Emergency Use Authorization (EUA).  This EUA will remain in effect (meaning this test can be used) for the duration of the COVID-19 declaration under Section 564(b)(1) of the Act, 21 U.S.C. section 360bbb-3(b)(1), unless the authorization is terminated or revoked sooner. Performed at [facility], [address], [place], [place] [address]    Studies/Results: Ct Abdomen Pelvis W Contrast  Result Date: 12/17/2018 CLINICAL DATA:  Abdominal pain with nausea and vomiting EXAM: CT ABDOMEN AND PELVIS WITH CONTRAST TECHNIQUE: Multidetector CT imaging of the abdomen and pelvis was performed using the standard protocol following bolus administration of intravenous contrast. CONTRAST:  122mL OMNIPAQUE IOHEXOL 300 MG/ML  SOLN COMPARISON:  06/10/2012 FINDINGS: Lower chest: Lung bases demonstrate emphysematous disease. No acute consolidation or effusion. Borderline to mild cardiomegaly. Hepatobiliary: Hypodensity at the dome of the liver measuring 18 mm, with  increased internal density values. Additional subcentimeter hypodensities in the liver too small to further characterize. No calcified gallstone or biliary dilatation Pancreas: Unremarkable. No pancreatic ductal dilatation or surrounding inflammatory changes. Spleen: Normal in size without focal abnormality. Adrenals/Urinary Tract: Adrenal glands appear within normal limits. Interval cystectomy. Multiple subcentimeter hypodense cortical lesions in the kidneys, too small to further characterize. Multifocal scarring within the left kidney. No hydronephrosis. No hydroureter. Urostomy to the right lower quadrant. Stomach/Bowel: Stomach nonenlarged. Multiple dilated loops of fluid-filled small bowel measuring up to 3.3 cm in the left lower quadrant. Beaked appearance of distal small bowel in the left low pelvis felt consistent with transition point. No colon wall thickening. Right lower quadrant large parastomal hernia containing mesenteric fat and colon. No obstruction. Vascular/Lymphatic: Extensive aortic atherosclerosis. No aneurysm. No significantly enlarged lymph nodes. Reproductive: Status post hysterectomy Other: Negative for free air or free fluid Musculoskeletal: No acute or suspicious osseous abnormality. IMPRESSION: 1. Multiple dilated loops of fluid-filled small bowel within the left abdomen and pelvis, consistent with mechanical small bowel obstruction, likely due to adhesions. Probable transition point within the low left pelvis just cephalad to the level of left superior pubic ramus. 2. Interval cystectomy and creation of right lower quadrant urinary diversion. Moderate to large right parastomal hernia containing mesenteric fat and colon but no evidence for obstruction or strangulation. 3. Interim finding of multifocal cortical scarring within the left kidney 4. Emphysematous disease 5. Indeterminate hypodensity at the dome of the liver. This is in the region of a previously noted cyst but now appears  smaller in size with increased internal density. Electronically Signed   By: Madie Reno.D.  On: 12/17/2018 22:35   Dg Abd Portable 1 View  Result Date: 12/18/2018 CLINICAL DATA:  73 year old female with abdominal pain nausea and vomiting. EXAM: PORTABLE ABDOMEN - 1 VIEW COMPARISON:  CT Abdomen and Pelvis yesterday FINDINGS: Portable AP supine view at 0022 hours. Enteric tube has been placed with side hole at the level of the gastric body. Persistent small bowel obstruction gas pattern with gas-filled loops dilated up to 43 millimeters. Stable gas pattern since yesterday. Stable lung bases. No definite pneumoperitoneum on this supine view. IMPRESSION: 1. Enteric tube placed, side hole at the level of the gastric body. 2. Stable small bowel obstruction gas pattern. Electronically Signed   By: Genevie Ann M.D.   On: 12/18/2018 00:55    Medications:  Prior to Admission:  Medications Prior to Admission  Medication Sig Dispense Refill Last Dose  . albuterol (PROVENTIL HFA;VENTOLIN HFA) 108 (90 Base) MCG/ACT inhaler Inhale 2 puffs into the lungs every 6 (six) hours as needed for wheezing or shortness of breath.   Taking  . amLODipine (NORVASC) 10 MG tablet Take 10 mg by mouth daily with breakfast.   Taking  . aspirin EC 81 MG tablet Take 81 mg by mouth daily.    Taking  . cetirizine (ZYRTEC) 10 MG tablet Take 10 mg by mouth daily.   Taking  . chlorthalidone (HYGROTON) 25 MG tablet TAKE (1) TABLET BY MOUTH AT BEDTIME. 30 tablet 6 Taking  . hydrALAZINE (APRESOLINE) 100 MG tablet Take 1 tablet (100 mg total) by mouth 3 (three) times daily. 270 tablet 3 Taking  . LORazepam (ATIVAN) 1 MG tablet Take 1 mg by mouth every 8 (eight) hours as needed for anxiety.   Taking  . Mesalamine (ASACOL HD) 800 MG TBEC Take 1 tablet by mouth 2 (two) times daily.   Taking  . metFORMIN (GLUCOPHAGE-XR) 500 MG 24 hr tablet Take 500 mg by mouth every evening.    Taking  . metoprolol tartrate (LOPRESSOR) 50 MG tablet TAKE (1)  TABLET BY MOUTH TWICE DAILY. 60 tablet 6   . Multiple Vitamins-Minerals (CENTRUM SILVER PO) Take 1 tablet by mouth daily.    Taking  . pantoprazole (PROTONIX) 40 MG tablet   11 Taking  . potassium chloride SA (K-DUR,KLOR-CON) 20 MEQ tablet TAKE 2 TABLETS BY MOUTH EACH MORNING AND 1 TABLET IN THE EVENING. 90 tablet 6   . pravastatin (PRAVACHOL) 40 MG tablet Take 40 mg by mouth every morning.    Taking  . umeclidinium-vilanterol (ANORO ELLIPTA) 62.5-25 MCG/INH AEPB Inhale 1 puff into the lungs daily.   Taking   Scheduled: . amLODipine  10 mg Oral Q breakfast  . heparin  5,000 Units Subcutaneous Q8H  . hydrALAZINE  100 mg Oral TID  . insulin aspart  0-5 Units Subcutaneous TID WC  . metoprolol tartrate  50 mg Oral BID  . sodium chloride flush  3 mL Intravenous Once  . sodium chloride flush  3 mL Intravenous Q12H  . umeclidinium-vilanterol  1 puff Inhalation Daily   Continuous: . sodium chloride    . 0.9 % NaCl with KCl 40 mEq / L 100 mL/hr at 12/18/18 0610  . famotidine (PEPCID) IV Stopped (12/18/18 0257)  . potassium chloride     TOI:ZTIWPY chloride, acetaminophen **OR** acetaminophen, albuterol, labetalol, LORazepam, morphine injection, ondansetron **OR** ondansetron (ZOFRAN) IV, sodium chloride flush, traZODone  Assesment: She has small bowel obstruction.  She has history of bladder cancer and has had a cystectomy and creation of an ileostomy so  this may be from adhesions.  She has hypertension and her blood pressure is up a little bit she has IV medication available if needed  She has diabetes on sliding scale  She has COPD and her inhaler has been ordered  She has anxiety and has medication available for that Principal Problem:   SBO (small bowel obstruction) ([place]) Active Problems:   Essential hypertension   IBD (inflammatory bowel disease)   Type 2 diabetes mellitus with chronic kidney disease and hypertension ([place])   History of bladder cancer    Plan: Continue  treatments.  Continue gastric tube for now.  Surgical consultation.    LOS: 0 days   April Gomez 12/18/2018, 8:34 AM

## 2018-12-19 LAB — COMPREHENSIVE METABOLIC PANEL
ALT: 13 U/L (ref 0–44)
AST: 14 U/L — ABNORMAL LOW (ref 15–41)
Albumin: 3.3 g/dL — ABNORMAL LOW (ref 3.5–5.0)
Alkaline Phosphatase: 48 U/L (ref 38–126)
Anion gap: 7 (ref 5–15)
BUN: 15 mg/dL (ref 8–23)
CO2: 26 mmol/L (ref 22–32)
Calcium: 8.3 mg/dL — ABNORMAL LOW (ref 8.9–10.3)
Chloride: 106 mmol/L (ref 98–111)
Creatinine, Ser: 0.7 mg/dL (ref 0.44–1.00)
GFR calc Af Amer: >60 mL/min (ref >60)
GFR calc non Af Amer: >60 mL/min (ref >60)
Glucose, Bld: 137 mg/dL — ABNORMAL HIGH (ref 70–99)
Potassium: 3.6 mmol/L (ref 3.5–5.1)
Sodium: 139 mmol/L (ref 135–145)
Total Bilirubin: 0.5 mg/dL (ref 0.3–1.2)
Total Protein: 6.3 g/dL — ABNORMAL LOW (ref 6.5–8.1)

## 2018-12-19 LAB — GLUCOSE, CAPILLARY
Glucose-Capillary: 125 mg/dL — ABNORMAL HIGH (ref 70–99)
Glucose-Capillary: 145 mg/dL — ABNORMAL HIGH (ref 70–99)
Glucose-Capillary: 149 mg/dL — ABNORMAL HIGH (ref 70–99)
Glucose-Capillary: 149 mg/dL — ABNORMAL HIGH (ref 70–99)

## 2018-12-19 MED ORDER — BISACODYL 10 MG RE SUPP
10.0000 mg | Freq: Two times a day (BID) | RECTAL | Status: DC
  Administered 2018-12-19 – 2018-12-20 (×4): 10 mg via RECTAL
  Filled 2018-12-19 (×4): qty 1

## 2018-12-19 MED ORDER — MORPHINE SULFATE (PF) 4 MG/ML IV SOLN
4.0000 mg | Freq: Once | INTRAVENOUS | Status: AC
  Administered 2018-12-19: 4 mg via INTRAVENOUS
  Filled 2018-12-19: qty 1

## 2018-12-19 MED ORDER — METOCLOPRAMIDE HCL 5 MG/ML IJ SOLN
10.0000 mg | Freq: Once | INTRAMUSCULAR | Status: AC
  Administered 2018-12-19: 10 mg via INTRAVENOUS
  Filled 2018-12-19: qty 2

## 2018-12-19 NOTE — Progress Notes (Signed)
Subjective: She says she feels a little bit better.  She has passed a little bit of gas and she had a bowel movement after a suppository.  She is not having much nausea.  She is not having much pain.  She still has an NG tube in place.  Her potassium is better at 3.6.  Objective: Vital signs in last 24 hours: Temp:  [98.4 F (36.9 C)-99.4 F (37.4 C)] 98.4 F (36.9 C) (05/23 0611) Pulse Rate:  [67-74] 74 (05/23 0611) Resp:  [16-17] 16 (05/23 0611) BP: (138-147)/(64-70) 143/64 (05/23 0611) SpO2:  [90 %-97 %] 90 % (05/23 0751) Weight change:  Last BM Date: 12/18/18  Intake/Output from previous day: 05/22 0701 - 05/23 0700 In: 2253.6 [I.V.:2151.2; IV Piggyback:102.4] Out: 1800 [Urine:1400; Emesis/NG output:400]  PHYSICAL EXAM General appearance: alert, cooperative and mild distress Resp: clear to auscultation bilaterally Cardio: regular rate and rhythm, S1, S2 normal, no murmur, click, rub or gallop GI: Her abdomen is softer.  Bowel sounds still somewhat sluggish. Extremities: extremities normal, atraumatic, no cyanosis or edema  Lab Results:  Results for orders placed or performed during the hospital encounter of 12/17/18 (from the past 48 hour(s))  Lipase, blood     Status: None   Collection Time: 12/17/18  8:17 PM  Result Value Ref Range   Lipase 32 11 - 51 U/L    Comment: Performed at [facility], [address], [place], [place] [address]  Comprehensive metabolic panel     Status: Abnormal   Collection Time: 12/17/18  8:17 PM  Result Value Ref Range   Sodium 138 135 - 145 mmol/L   Potassium 2.6 (LL) 3.5 - 5.1 mmol/L    Comment: CRITICAL RESULT CALLED TO, READ BACK BY AND VERIFIED WITH: SAPPELT,J ON 12/17/18 AT 2130 BY LOY,C    Chloride 97 (L) 98 - 111 mmol/L   CO2 28 22 - 32 mmol/L   Glucose, Bld 202 (H) 70 - 99 mg/dL   BUN 24 (H) 8 - 23 mg/dL   Creatinine, Ser 0.92 0.44 - 1.00 mg/dL   Calcium 9.7 8.9 - 10.3 mg/dL   Total Protein 8.0 6.5 - 8.1 g/dL   Albumin 4.2  3.5 - 5.0 g/dL   AST 22 15 - 41 U/L   ALT 20 0 - 44 U/L   Alkaline Phosphatase 58 38 - 126 U/L   Total Bilirubin 0.6 0.3 - 1.2 mg/dL   GFR calc non Af Amer >60 >60 mL/min   GFR calc Af Amer >60 >60 mL/min   Anion gap 13 5 - 15    Comment: Performed at [facility], [address] [place], [place] [address]  CBC     Status: Abnormal   Collection Time: 12/17/18  8:17 PM  Result Value Ref Range   WBC 14.7 (H) 4.0 - 10.5 K/uL   RBC 4.51 3.87 - 5.11 MIL/uL   Hemoglobin 13.1 12.0 - 15.0 g/dL   HCT 40.8 36.0 - 46.0 %   MCV 90.5 80.0 - 100.0 fL   MCH 29.0 26.0 - 34.0 pg   MCHC 32.1 30.0 - 36.0 g/dL   RDW 13.5 11.5 - 15.5 %   Platelets 400 150 - 400 K/uL   nRBC 0.0 0.0 - 0.2 %    Comment: Performed at [facility], [address], [place], [place] [address]  Urinalysis, Routine w reflex microscopic     Status: Abnormal   Collection Time: 12/17/18  9:00 PM  Result Value Ref Range  Color, Urine YELLOW YELLOW   APPearance HAZY (A) CLEAR   Specific Gravity, Urine 1.017 1.005 - 1.030   pH 6.0 5.0 - 8.0   Glucose, UA NEGATIVE NEGATIVE mg/dL   Hgb urine dipstick NEGATIVE NEGATIVE   Bilirubin Urine NEGATIVE NEGATIVE   Ketones, ur NEGATIVE NEGATIVE mg/dL   Protein, ur 100 (A) NEGATIVE mg/dL   Nitrite NEGATIVE NEGATIVE   Leukocytes,Ua SMALL (A) NEGATIVE   RBC / HPF 0-5 0 - 5 RBC/hpf   WBC, UA 21-50 0 - 5 WBC/hpf   Bacteria, UA RARE (A) NONE SEEN   Mucus PRESENT    Hyaline Casts, UA PRESENT     Comment: Performed at [facility], [address], [place], [place] [address]  SARS Coronavirus 2 (CEPHEID - Performed in [place] hospital lab), Hosp Order     Status: None   Collection Time: 12/17/18 11:21 PM  Result Value Ref Range   SARS Coronavirus 2 NEGATIVE NEGATIVE    Comment: (NOTE) If result is NEGATIVE SARS-CoV-2 target nucleic acids are NOT DETECTED. The SARS-CoV-2 RNA is generally detectable in upper and lower  respiratory specimens during the acute phase of infection. The lowest   concentration of SARS-CoV-2 viral copies this assay can detect is 250  copies / mL. A negative result does not preclude SARS-CoV-2 infection  and should not be used as the sole basis for treatment or other  patient management decisions.  A negative result may occur with  improper specimen collection / handling, submission of specimen other  than nasopharyngeal swab, presence of viral mutation(s) within the  areas targeted by this assay, and inadequate number of viral copies  (<250 copies / mL). A negative result must be combined with clinical  observations, patient history, and epidemiological information. If result is POSITIVE SARS-CoV-2 target nucleic acids are DETECTED. The SARS-CoV-2 RNA is generally detectable in upper and lower  respiratory specimens dur ing the acute phase of infection.  Positive  results are indicative of active infection with SARS-CoV-2.  Clinical  correlation with patient history and other diagnostic information is  necessary to determine patient infection status.  Positive results do  not rule out bacterial infection or co-infection with other viruses. If result is PRESUMPTIVE POSTIVE SARS-CoV-2 nucleic acids MAY BE PRESENT.   A presumptive positive result was obtained on the submitted specimen  and confirmed on repeat testing.  While 2019 novel coronavirus  (SARS-CoV-2) nucleic acids may be present in the submitted sample  additional confirmatory testing may be necessary for epidemiological  and / or clinical management purposes  to differentiate between  SARS-CoV-2 and other Sarbecovirus currently known to infect humans.  If clinically indicated additional testing with an alternate test  methodology [phone]) is advised. The SARS-CoV-2 RNA is generally  detectable in upper and lower respiratory sp ecimens during the acute  phase of infection. The expected result is Negative. Fact Sheet for Patients:  StrictlyIdeas.no Fact Sheet  for Healthcare Providers: BankingDealers.co.za This test is not yet approved or cleared by the [place] FDA and has been authorized for detection and/or diagnosis of SARS-CoV-2 by FDA under an Emergency Use Authorization (EUA).  This EUA will remain in effect (meaning this test can be used) for the duration of the COVID-19 declaration under Section 564(b)(1) of the Act, 21 U.S.C. section 360bbb-3(b)(1), unless the authorization is terminated or revoked sooner. Performed at [facility], [address] [place], [place] [address]   CBG monitoring, ED     Status: Abnormal  Collection Time: 12/18/18 12:58 AM  Result Value Ref Range   Glucose-Capillary 140 (H) 70 - 99 mg/dL  Basic metabolic panel     Status: Abnormal   Collection Time: 12/18/18  6:28 AM  Result Value Ref Range   Sodium 138 135 - 145 mmol/L   Potassium 2.8 (L) 3.5 - 5.1 mmol/L   Chloride 100 98 - 111 mmol/L   CO2 29 22 - 32 mmol/L   Glucose, Bld 165 (H) 70 - 99 mg/dL   BUN 18 8 - 23 mg/dL   Creatinine, Ser 0.67 0.44 - 1.00 mg/dL   Calcium 8.6 (L) 8.9 - 10.3 mg/dL   GFR calc non Af Amer >60 >60 mL/min   GFR calc Af Amer >60 >60 mL/min   Anion gap 9 5 - 15    Comment: Performed at [facility], [address], [place], [place] [address]  CBC     Status: Abnormal   Collection Time: 12/18/18  6:28 AM  Result Value Ref Range   WBC 12.7 (H) 4.0 - 10.5 K/uL   RBC 4.17 3.87 - 5.11 MIL/uL   Hemoglobin 12.0 12.0 - 15.0 g/dL   HCT 38.5 36.0 - 46.0 %   MCV 92.3 80.0 - 100.0 fL   MCH 28.8 26.0 - 34.0 pg   MCHC 31.2 30.0 - 36.0 g/dL   RDW 13.4 11.5 - 15.5 %   Platelets 349 150 - 400 K/uL   nRBC 0.0 0.0 - 0.2 %    Comment: Performed at [facility], [address] [place], [place] [address]  Magnesium     Status: None   Collection Time: 12/18/18  6:28 AM  Result Value Ref Range   Magnesium 2.3 1.7 - 2.4 mg/dL    Comment: Performed at [facility], [address] [place], [place] [address]   Glucose, capillary     Status: Abnormal   Collection Time: 12/18/18  8:45 AM  Result Value Ref Range   Glucose-Capillary 158 (H) 70 - 99 mg/dL  Glucose, capillary     Status: Abnormal   Collection Time: 12/18/18 11:07 AM  Result Value Ref Range   Glucose-Capillary 157 (H) 70 - 99 mg/dL  Glucose, capillary     Status: Abnormal   Collection Time: 12/18/18  4:19 PM  Result Value Ref Range   Glucose-Capillary 138 (H) 70 - 99 mg/dL  Glucose, capillary     Status: Abnormal   Collection Time: 12/18/18 10:18 PM  Result Value Ref Range   Glucose-Capillary 131 (H) 70 - 99 mg/dL  Comprehensive metabolic panel     Status: Abnormal   Collection Time: 12/19/18  6:16 AM  Result Value Ref Range   Sodium 139 135 - 145 mmol/L   Potassium 3.6 3.5 - 5.1 mmol/L    Comment: DELTA CHECK NOTED   Chloride 106 98 - 111 mmol/L   CO2 26 22 - 32 mmol/L   Glucose, Bld 137 (H) 70 - 99 mg/dL   BUN 15 8 - 23 mg/dL   Creatinine, Ser 0.70 0.44 - 1.00 mg/dL   Calcium 8.3 (L) 8.9 - 10.3 mg/dL   Total Protein 6.3 (L) 6.5 - 8.1 g/dL   Albumin 3.3 (L) 3.5 - 5.0 g/dL   AST 14 (L) 15 - 41 U/L   ALT 13 0 - 44 U/L   Alkaline Phosphatase 48 38 - 126 U/L   Total Bilirubin 0.5 0.3 - 1.2 mg/dL   GFR calc non Af Amer >60 >60 mL/min  GFR calc Af Amer >60 >60 mL/min   Anion gap 7 5 - 15    Comment: Performed at [facility], [address] [place], [place] [address]  Glucose, capillary     Status: Abnormal   Collection Time: 12/19/18  7:27 AM  Result Value Ref Range   Glucose-Capillary 125 (H) 70 - 99 mg/dL    ABGS No results for input(s): PHART, PO2ART, TCO2, HCO3 in the last 72 hours.  Invalid input(s): PCO2 CULTURES Recent Results (from the past 240 hour(s))  SARS Coronavirus 2 (CEPHEID - Performed in [place] hospital lab), Hosp Order     Status: None   Collection Time: 12/17/18 11:21 PM  Result Value Ref Range Status   SARS Coronavirus 2 NEGATIVE NEGATIVE Final    Comment: (NOTE) If result is  NEGATIVE SARS-CoV-2 target nucleic acids are NOT DETECTED. The SARS-CoV-2 RNA is generally detectable in upper and lower  respiratory specimens during the acute phase of infection. The lowest  concentration of SARS-CoV-2 viral copies this assay can detect is 250  copies / mL. A negative result does not preclude SARS-CoV-2 infection  and should not be used as the sole basis for treatment or other  patient management decisions.  A negative result may occur with  improper specimen collection / handling, submission of specimen other  than nasopharyngeal swab, presence of viral mutation(s) within the  areas targeted by this assay, and inadequate number of viral copies  (<250 copies / mL). A negative result must be combined with clinical  observations, patient history, and epidemiological information. If result is POSITIVE SARS-CoV-2 target nucleic acids are DETECTED. The SARS-CoV-2 RNA is generally detectable in upper and lower  respiratory specimens dur ing the acute phase of infection.  Positive  results are indicative of active infection with SARS-CoV-2.  Clinical  correlation with patient history and other diagnostic information is  necessary to determine patient infection status.  Positive results do  not rule out bacterial infection or co-infection with other viruses. If result is PRESUMPTIVE POSTIVE SARS-CoV-2 nucleic acids MAY BE PRESENT.   A presumptive positive result was obtained on the submitted specimen  and confirmed on repeat testing.  While 2019 novel coronavirus  (SARS-CoV-2) nucleic acids may be present in the submitted sample  additional confirmatory testing may be necessary for epidemiological  and / or clinical management purposes  to differentiate between  SARS-CoV-2 and other Sarbecovirus currently known to infect humans.  If clinically indicated additional testing with an alternate test  methodology [phone]) is advised. The SARS-CoV-2 RNA is generally  detectable  in upper and lower respiratory sp ecimens during the acute  phase of infection. The expected result is Negative. Fact Sheet for Patients:  StrictlyIdeas.no Fact Sheet for Healthcare Providers: BankingDealers.co.za This test is not yet approved or cleared by the [place] FDA and has been authorized for detection and/or diagnosis of SARS-CoV-2 by FDA under an Emergency Use Authorization (EUA).  This EUA will remain in effect (meaning this test can be used) for the duration of the COVID-19 declaration under Section 564(b)(1) of the Act, 21 U.S.C. section 360bbb-3(b)(1), unless the authorization is terminated or revoked sooner. Performed at [facility], [address] [place], [place] [address]    Studies/Results: Ct Abdomen Pelvis W Contrast  Result Date: 12/17/2018 CLINICAL DATA:  Abdominal pain with nausea and vomiting EXAM: CT ABDOMEN AND PELVIS WITH CONTRAST TECHNIQUE: Multidetector CT imaging of the abdomen and pelvis was performed using the standard protocol following bolus  administration of intravenous contrast. CONTRAST:  175mL OMNIPAQUE IOHEXOL 300 MG/ML  SOLN COMPARISON:  06/10/2012 FINDINGS: Lower chest: Lung bases demonstrate emphysematous disease. No acute consolidation or effusion. Borderline to mild cardiomegaly. Hepatobiliary: Hypodensity at the dome of the liver measuring 18 mm, with increased internal density values. Additional subcentimeter hypodensities in the liver too small to further characterize. No calcified gallstone or biliary dilatation Pancreas: Unremarkable. No pancreatic ductal dilatation or surrounding inflammatory changes. Spleen: Normal in size without focal abnormality. Adrenals/Urinary Tract: Adrenal glands appear within normal limits. Interval cystectomy. Multiple subcentimeter hypodense cortical lesions in the kidneys, too small to further characterize. Multifocal scarring within the left kidney. No  hydronephrosis. No hydroureter. Urostomy to the right lower quadrant. Stomach/Bowel: Stomach nonenlarged. Multiple dilated loops of fluid-filled small bowel measuring up to 3.3 cm in the left lower quadrant. Beaked appearance of distal small bowel in the left low pelvis felt consistent with transition point. No colon wall thickening. Right lower quadrant large parastomal hernia containing mesenteric fat and colon. No obstruction. Vascular/Lymphatic: Extensive aortic atherosclerosis. No aneurysm. No significantly enlarged lymph nodes. Reproductive: Status post hysterectomy Other: Negative for free air or free fluid Musculoskeletal: No acute or suspicious osseous abnormality. IMPRESSION: 1. Multiple dilated loops of fluid-filled small bowel within the left abdomen and pelvis, consistent with mechanical small bowel obstruction, likely due to adhesions. Probable transition point within the low left pelvis just cephalad to the level of left superior pubic ramus. 2. Interval cystectomy and creation of right lower quadrant urinary diversion. Moderate to large right parastomal hernia containing mesenteric fat and colon but no evidence for obstruction or strangulation. 3. Interim finding of multifocal cortical scarring within the left kidney 4. Emphysematous disease 5. Indeterminate hypodensity at the dome of the liver. This is in the region of a previously noted cyst but now appears smaller in size with increased internal density. Electronically Signed   By: Donavan Foil M.D.   On: 12/17/2018 22:35   Dg Abd Portable 1 View  Result Date: 12/18/2018 CLINICAL DATA:  73 year old female with abdominal pain nausea and vomiting. EXAM: PORTABLE ABDOMEN - 1 VIEW COMPARISON:  CT Abdomen and Pelvis yesterday FINDINGS: Portable AP supine view at 0022 hours. Enteric tube has been placed with side hole at the level of the gastric body. Persistent small bowel obstruction gas pattern with gas-filled loops dilated up to 43 millimeters.  Stable gas pattern since yesterday. Stable lung bases. No definite pneumoperitoneum on this supine view. IMPRESSION: 1. Enteric tube placed, side hole at the level of the gastric body. 2. Stable small bowel obstruction gas pattern. Electronically Signed   By: Genevie Ann M.D.   On: 12/18/2018 00:55    Medications:  Scheduled: . amLODipine  10 mg Oral Q breakfast  . heparin  5,000 Units Subcutaneous Q8H  . hydrALAZINE  100 mg Oral TID  . insulin aspart  0-5 Units Subcutaneous TID WC  . metoprolol tartrate  50 mg Oral BID  . sodium chloride flush  3 mL Intravenous Once  . sodium chloride flush  3 mL Intravenous Q12H  . umeclidinium-vilanterol  1 puff Inhalation Daily   Continuous: . sodium chloride    . 0.9 % NaCl with KCl 40 mEq / L 100 mL/hr (12/19/18 0535)  . famotidine (PEPCID) IV 20 mg (12/19/18 0837)   LHT:DSKAJG chloride, acetaminophen **OR** acetaminophen, albuterol, labetalol, LORazepam, morphine injection, ondansetron **OR** ondansetron (ZOFRAN) IV, sodium chloride flush, traZODone  Assesment: She has small bowel obstruction.  She is doing better.  She has no complaints this morning except she wants to drink.  No abdominal pain. Principal Problem:   SBO (small bowel obstruction) (HCC) Active Problems:   Essential hypertension   IBD (inflammatory bowel disease)   Type 2 diabetes mellitus with chronic kidney disease and hypertension ([place])   History of bladder cancer    Plan: Discussed with Dr. Arnoldo Morale at bedside.  Plan is to leave NG in place today potential removal tomorrow.    LOS: 1 day   Alonza Bogus 12/19/2018, 9:14 AM

## 2018-12-19 NOTE — Progress Notes (Signed)
Subjective: Patient denies any abdominal pain.  Has had a small bowel movement.  Complains of a dry mouth.  Objective: Vital signs in last 24 hours: Temp:  [98.4 F (36.9 C)-99.4 F (37.4 C)] 98.4 F (36.9 C) (05/23 0611) Pulse Rate:  [67-74] 74 (05/23 0611) Resp:  [16-17] 16 (05/23 0611) BP: (138-147)/(64-70) 143/64 (05/23 0611) SpO2:  [90 %-97 %] 90 % (05/23 0751) Last BM Date: 12/18/18  Intake/Output from previous day: 05/22 0701 - 05/23 0700 In: 2253.6 [I.V.:2151.2; IV Piggyback:102.4] Out: 1800 [Urine:1400; Emesis/NG output:400] Intake/Output this shift: No intake/output data recorded.  General appearance: alert, cooperative and no distress GI: Soft, nontender, nondistended.  Ileostomy conduit in place right lower quadrant.  Lab Results:  Recent Labs    12/17/18 2017 12/18/18 0628  WBC 14.7* 12.7*  HGB 13.1 12.0  HCT 40.8 38.5  PLT 400 349   BMET Recent Labs    12/18/18 0628 12/19/18 0616  NA 138 139  K 2.8* 3.6  CL 100 106  CO2 29 26  GLUCOSE 165* 137*  BUN 18 15  CREATININE 0.67 0.70  CALCIUM 8.6* 8.3*   PT/INR No results for input(s): LABPROT, INR in the last 72 hours.  Studies/Results: Ct Abdomen Pelvis W Contrast  Result Date: 12/17/2018 CLINICAL DATA:  Abdominal pain with nausea and vomiting EXAM: CT ABDOMEN AND PELVIS WITH CONTRAST TECHNIQUE: Multidetector CT imaging of the abdomen and pelvis was performed using the standard protocol following bolus administration of intravenous contrast. CONTRAST:  11mL OMNIPAQUE IOHEXOL 300 MG/ML  SOLN COMPARISON:  06/10/2012 FINDINGS: Lower chest: Lung bases demonstrate emphysematous disease. No acute consolidation or effusion. Borderline to mild cardiomegaly. Hepatobiliary: Hypodensity at the dome of the liver measuring 18 mm, with increased internal density values. Additional subcentimeter hypodensities in the liver too small to further characterize. No calcified gallstone or biliary dilatation Pancreas:  Unremarkable. No pancreatic ductal dilatation or surrounding inflammatory changes. Spleen: Normal in size without focal abnormality. Adrenals/Urinary Tract: Adrenal glands appear within normal limits. Interval cystectomy. Multiple subcentimeter hypodense cortical lesions in the kidneys, too small to further characterize. Multifocal scarring within the left kidney. No hydronephrosis. No hydroureter. Urostomy to the right lower quadrant. Stomach/Bowel: Stomach nonenlarged. Multiple dilated loops of fluid-filled small bowel measuring up to 3.3 cm in the left lower quadrant. Beaked appearance of distal small bowel in the left low pelvis felt consistent with transition point. No colon wall thickening. Right lower quadrant large parastomal hernia containing mesenteric fat and colon. No obstruction. Vascular/Lymphatic: Extensive aortic atherosclerosis. No aneurysm. No significantly enlarged lymph nodes. Reproductive: Status post hysterectomy Other: Negative for free air or free fluid Musculoskeletal: No acute or suspicious osseous abnormality. IMPRESSION: 1. Multiple dilated loops of fluid-filled small bowel within the left abdomen and pelvis, consistent with mechanical small bowel obstruction, likely due to adhesions. Probable transition point within the low left pelvis just cephalad to the level of left superior pubic ramus. 2. Interval cystectomy and creation of right lower quadrant urinary diversion. Moderate to large right parastomal hernia containing mesenteric fat and colon but no evidence for obstruction or strangulation. 3. Interim finding of multifocal cortical scarring within the left kidney 4. Emphysematous disease 5. Indeterminate hypodensity at the dome of the liver. This is in the region of a previously noted cyst but now appears smaller in size with increased internal density. Electronically Signed   By: Donavan Foil M.D.   On: 12/17/2018 22:35   Dg Abd Portable 1 View  Result Date: 12/18/2018 CLINICAL  DATA:  73 year old female with abdominal pain nausea and vomiting. EXAM: PORTABLE ABDOMEN - 1 VIEW COMPARISON:  CT Abdomen and Pelvis yesterday FINDINGS: Portable AP supine view at 0022 hours. Enteric tube has been placed with side hole at the level of the gastric body. Persistent small bowel obstruction gas pattern with gas-filled loops dilated up to 43 millimeters. Stable gas pattern since yesterday. Stable lung bases. No definite pneumoperitoneum on this supine view. IMPRESSION: 1. Enteric tube placed, side hole at the level of the gastric body. 2. Stable small bowel obstruction gas pattern. Electronically Signed   By: Genevie Ann M.D.   On: 12/18/2018 00:55    Anti-infectives: Anti-infectives (From admission, onward)   None      Assessment/Plan: Impression: Partial small bowel obstruction, improving.  Hypokalemia resolving. Plan: We will continue NG tube decompression for 24 more hours.  Will reassess in the morning.  Discussed with Dr. Luan Pulling.  LOS: 1 day    Aviva Signs 12/19/2018

## 2018-12-20 LAB — GLUCOSE, CAPILLARY
Glucose-Capillary: 169 mg/dL — ABNORMAL HIGH (ref 70–99)
Glucose-Capillary: 160 mg/dL — ABNORMAL HIGH (ref 70–99)
Glucose-Capillary: 157 mg/dL — ABNORMAL HIGH (ref 70–99)
Glucose-Capillary: 142 mg/dL — ABNORMAL HIGH (ref 70–99)

## 2018-12-20 MED ORDER — MILK AND MOLASSES ENEMA
1.0000 | Freq: Once | RECTAL | Status: AC
  Administered 2018-12-20: 240 mL via RECTAL

## 2018-12-20 NOTE — Progress Notes (Signed)
Gave patient second milk and molasses enema. Produced a small amount of stool.

## 2018-12-20 NOTE — Progress Notes (Signed)
Subjective: She says she did not feel well last night.  She had more abdominal pain and she had a lot of feeling of gas.  She is now been able to pass in a significant amount of gas and she did have 2 small bowel movements with suppositories but no spontaneous bowel movements.  Objective: Vital signs in last 24 hours: Temp:  [98.6 F (37 C)-99.2 F (37.3 C)] 99.2 F (37.3 C) (05/24 0556) Pulse Rate:  [73-83] 78 (05/24 0556) Resp:  [16-18] 16 (05/24 0556) BP: (137-160)/(63-70) 145/70 (05/24 0556) SpO2:  [92 %-96 %] 96 % (05/24 0811) Weight:  [78.8 kg] 78.8 kg (05/24 0556) Weight change:  Last BM Date: 12/19/18  Intake/Output from previous day: 05/23 0701 - 05/24 0700 In: 200 [NG/GT:200] Out: 1200 [Urine:1200]  PHYSICAL EXAM General appearance: alert and moderate distress Resp: clear to auscultation bilaterally Cardio: regular rate and rhythm, S1, S2 normal, no murmur, click, rub or gallop GI: She is more distended than yesterday.  She still has hypoactive bowel sounds minimal tenderness Extremities: extremities normal, atraumatic, no cyanosis or edema  Lab Results:  Results for orders placed or performed during the hospital encounter of 12/17/18 (from the past 48 hour(s))  Glucose, capillary     Status: Abnormal   Collection Time: 12/18/18 11:07 AM  Result Value Ref Range   Glucose-Capillary 157 (H) 70 - 99 mg/dL  Glucose, capillary     Status: Abnormal   Collection Time: 12/18/18  4:19 PM  Result Value Ref Range   Glucose-Capillary 138 (H) 70 - 99 mg/dL  Glucose, capillary     Status: Abnormal   Collection Time: 12/18/18 10:18 PM  Result Value Ref Range   Glucose-Capillary 131 (H) 70 - 99 mg/dL  Comprehensive metabolic panel     Status: Abnormal   Collection Time: 12/19/18  6:16 AM  Result Value Ref Range   Sodium 139 135 - 145 mmol/L   Potassium 3.6 3.5 - 5.1 mmol/L    Comment: DELTA CHECK NOTED   Chloride 106 98 - 111 mmol/L   CO2 26 22 - 32 mmol/L   Glucose, Bld  137 (H) 70 - 99 mg/dL   BUN 15 8 - 23 mg/dL   Creatinine, Ser 0.70 0.44 - 1.00 mg/dL   Calcium 8.3 (L) 8.9 - 10.3 mg/dL   Total Protein 6.3 (L) 6.5 - 8.1 g/dL   Albumin 3.3 (L) 3.5 - 5.0 g/dL   AST 14 (L) 15 - 41 U/L   ALT 13 0 - 44 U/L   Alkaline Phosphatase 48 38 - 126 U/L   Total Bilirubin 0.5 0.3 - 1.2 mg/dL   GFR calc non Af Amer >60 >60 mL/min   GFR calc Af Amer >60 >60 mL/min   Anion gap 7 5 - 15    Comment: Performed at [facility], [address], [place], [place] [address]  Glucose, capillary     Status: Abnormal   Collection Time: 12/19/18  7:27 AM  Result Value Ref Range   Glucose-Capillary 125 (H) 70 - 99 mg/dL  Glucose, capillary     Status: Abnormal   Collection Time: 12/19/18 11:13 AM  Result Value Ref Range   Glucose-Capillary 145 (H) 70 - 99 mg/dL  Glucose, capillary     Status: Abnormal   Collection Time: 12/19/18  4:26 PM  Result Value Ref Range   Glucose-Capillary 149 (H) 70 - 99 mg/dL  Glucose, capillary     Status: Abnormal   Collection Time: 12/19/18  9:55 PM  Result Value Ref Range   Glucose-Capillary 149 (H) 70 - 99 mg/dL  Glucose, capillary     Status: Abnormal   Collection Time: 12/20/18  7:28 AM  Result Value Ref Range   Glucose-Capillary 169 (H) 70 - 99 mg/dL    ABGS No results for input(s): PHART, PO2ART, TCO2, HCO3 in the last 72 hours.  Invalid input(s): PCO2 CULTURES Recent Results (from the past 240 hour(s))  SARS Coronavirus 2 (CEPHEID - Performed in [place] hospital lab), Hosp Order     Status: None   Collection Time: 12/17/18 11:21 PM  Result Value Ref Range Status   SARS Coronavirus 2 NEGATIVE NEGATIVE Final    Comment: (NOTE) If result is NEGATIVE SARS-CoV-2 target nucleic acids are NOT DETECTED. The SARS-CoV-2 RNA is generally detectable in upper and lower  respiratory specimens during the acute phase of infection. The lowest  concentration of SARS-CoV-2 viral copies this assay can detect is 250  copies / mL. A negative  result does not preclude SARS-CoV-2 infection  and should not be used as the sole basis for treatment or other  patient management decisions.  A negative result may occur with  improper specimen collection / handling, submission of specimen other  than nasopharyngeal swab, presence of viral mutation(s) within the  areas targeted by this assay, and inadequate number of viral copies  (<250 copies / mL). A negative result must be combined with clinical  observations, patient history, and epidemiological information. If result is POSITIVE SARS-CoV-2 target nucleic acids are DETECTED. The SARS-CoV-2 RNA is generally detectable in upper and lower  respiratory specimens dur ing the acute phase of infection.  Positive  results are indicative of active infection with SARS-CoV-2.  Clinical  correlation with patient history and other diagnostic information is  necessary to determine patient infection status.  Positive results do  not rule out bacterial infection or co-infection with other viruses. If result is PRESUMPTIVE POSTIVE SARS-CoV-2 nucleic acids MAY BE PRESENT.   A presumptive positive result was obtained on the submitted specimen  and confirmed on repeat testing.  While 2019 novel coronavirus  (SARS-CoV-2) nucleic acids may be present in the submitted sample  additional confirmatory testing may be necessary for epidemiological  and / or clinical management purposes  to differentiate between  SARS-CoV-2 and other Sarbecovirus currently known to infect humans.  If clinically indicated additional testing with an alternate test  methodology [phone]) is advised. The SARS-CoV-2 RNA is generally  detectable in upper and lower respiratory sp ecimens during the acute  phase of infection. The expected result is Negative. Fact Sheet for Patients:  StrictlyIdeas.no Fact Sheet for Healthcare Providers: BankingDealers.co.za This test is not yet  approved or cleared by the [place] FDA and has been authorized for detection and/or diagnosis of SARS-CoV-2 by FDA under an Emergency Use Authorization (EUA).  This EUA will remain in effect (meaning this test can be used) for the duration of the COVID-19 declaration under Section 564(b)(1) of the Act, 21 U.S.C. section 360bbb-3(b)(1), unless the authorization is terminated or revoked sooner. Performed at [facility], [address], [place], [place] [address]    Studies/Results: No results found.  Medications:  Prior to Admission:  Medications Prior to Admission  Medication Sig Dispense Refill Last Dose  . albuterol (PROVENTIL HFA;VENTOLIN HFA) 108 (90 Base) MCG/ACT inhaler Inhale 2 puffs into the lungs every 6 (six) hours as needed for wheezing or shortness of breath.   Past Month at Unknown  time  . amLODipine (NORVASC) 10 MG tablet Take 10 mg by mouth daily with breakfast.   12/15/2018  . aspirin EC 81 MG tablet Take 81 mg by mouth daily.    12/15/2018  . cetirizine (ZYRTEC) 10 MG tablet Take 10 mg by mouth daily.   12/15/2018  . chlorthalidone (HYGROTON) 25 MG tablet TAKE (1) TABLET BY MOUTH AT BEDTIME. 30 tablet 6 12/15/2018  . hydrALAZINE (APRESOLINE) 100 MG tablet Take 1 tablet (100 mg total) by mouth 3 (three) times daily. 270 tablet 3 12/15/2018  . LORazepam (ATIVAN) 1 MG tablet Take 1 mg by mouth every 8 (eight) hours as needed for anxiety.   12/15/2018  . Mesalamine (ASACOL HD) 800 MG TBEC Take 1 tablet by mouth 2 (two) times daily.   12/15/2018  . metFORMIN (GLUCOPHAGE-XR) 500 MG 24 hr tablet Take 500 mg by mouth every evening.    12/15/2018  . metoprolol tartrate (LOPRESSOR) 50 MG tablet TAKE (1) TABLET BY MOUTH TWICE DAILY. 60 tablet 6 12/17/2018 at 1700  . Multiple Vitamins-Minerals (CENTRUM SILVER PO) Take 1 tablet by mouth daily.    12/15/2018  . pantoprazole (PROTONIX) 40 MG tablet Take 40 mg by mouth daily.   11 12/15/2018  . potassium chloride SA (K-DUR,KLOR-CON) 20 MEQ  tablet TAKE 2 TABLETS BY MOUTH EACH MORNING AND 1 TABLET IN THE EVENING. 90 tablet 6 12/15/2018  . pravastatin (PRAVACHOL) 40 MG tablet Take 40 mg by mouth every morning.    12/15/2018  . umeclidinium-vilanterol (ANORO ELLIPTA) 62.5-25 MCG/INH AEPB Inhale 1 puff into the lungs daily.   12/15/2018   Scheduled: . amLODipine  10 mg Oral Q breakfast  . bisacodyl  10 mg Rectal BID  . heparin  5,000 Units Subcutaneous Q8H  . hydrALAZINE  100 mg Oral TID  . insulin aspart  0-5 Units Subcutaneous TID WC  . metoprolol tartrate  50 mg Oral BID  . sodium chloride flush  3 mL Intravenous Once  . sodium chloride flush  3 mL Intravenous Q12H  . umeclidinium-vilanterol  1 puff Inhalation Daily   Continuous: . sodium chloride    . 0.9 % NaCl with KCl 40 mEq / L 100 mL/hr (12/20/18 0441)  . famotidine (PEPCID) IV 20 mg (12/19/18 2230)   ENI:DPOEUM chloride, acetaminophen **OR** acetaminophen, albuterol, labetalol, LORazepam, morphine injection, ondansetron **OR** ondansetron (ZOFRAN) IV, sodium chloride flush, traZODone  Assesment: She was admitted with small bowel obstruction.  She had been improving but had a bad night last night.  She seems to have more distention.  She has COPD and she is on Anoro which is doing okay  She has history of bladder cancer and had ileostomy and her surgery for the bladder cancer may be the source of her small bowel obstruction  She has diabetes which is being treated Principal Problem:   SBO (small bowel obstruction) ([place]) Active Problems:   Essential hypertension   IBD (inflammatory bowel disease)   Type 2 diabetes mellitus with chronic kidney disease and hypertension ([place])   History of bladder cancer    Plan: Continue current treatments.  She had not been requiring pain medication before but will provide her pain meds now.  Discussed with Dr. Arnoldo Morale    LOS: 2 days   April Gomez 12/20/2018, 9:03 AM

## 2018-12-20 NOTE — Progress Notes (Signed)
  Subjective: Patient having a lot of gas pain.  Minimal results with Dulcolax suppository.  Objective: Vital signs in last 24 hours: Temp:  [98.6 F (37 C)-99.2 F (37.3 C)] 99.2 F (37.3 C) (05/24 0556) Pulse Rate:  [73-83] 78 (05/24 0556) Resp:  [16-18] 16 (05/24 0556) BP: (137-160)/(63-70) 145/70 (05/24 0556) SpO2:  [92 %-96 %] 96 % (05/24 0811) Weight:  [78.8 kg] 78.8 kg (05/24 0556) Last BM Date: 12/19/18  Intake/Output from previous day: 05/23 0701 - 05/24 0700 In: 200 [NG/GT:200] Out: 1200 [Urine:1200] Intake/Output this shift: No intake/output data recorded.  General appearance: alert, cooperative and fatigued GI: Soft but distended.  No point tenderness noted.  Occasional bowel sounds appreciated.  Lab Results:  Recent Labs    12/17/18 2017 12/18/18 0628  WBC 14.7* 12.7*  HGB 13.1 12.0  HCT 40.8 38.5  PLT 400 349   BMET Recent Labs    12/18/18 0628 12/19/18 0616  NA 138 139  K 2.8* 3.6  CL 100 106  CO2 29 26  GLUCOSE 165* 137*  BUN 18 15  CREATININE 0.67 0.70  CALCIUM 8.6* 8.3*   PT/INR No results for input(s): LABPROT, INR in the last 72 hours.  Studies/Results: No results found.  Anti-infectives: Anti-infectives (From admission, onward)   None      Assessment/Plan: Impression: Small bowel obstruction Plan: Continue NG tube decompression.  Will try enema today for bowel stimulation.  If not improved in next 24 hours, will repeat CT scan with oral contrast via the NG tube.  Discussed with Dr. Luan Pulling.  LOS: 2 days    Aviva Signs 12/20/2018

## 2018-12-21 LAB — CBC WITH DIFFERENTIAL/PLATELET
Abs Immature Granulocytes: 0.04 10*3/uL (ref 0.00–0.07)
Basophils Absolute: 0 10*3/uL (ref 0.0–0.1)
Basophils Relative: 0 %
Eosinophils Absolute: 0 10*3/uL (ref 0.0–0.5)
Eosinophils Relative: 0 %
HCT: 38.5 % (ref 36.0–46.0)
Hemoglobin: 12.2 g/dL (ref 12.0–15.0)
Immature Granulocytes: 0 %
Lymphocytes Relative: 14 %
Lymphs Abs: 1.3 10*3/uL (ref 0.7–4.0)
MCH: 29.4 pg (ref 26.0–34.0)
MCHC: 31.7 g/dL (ref 30.0–36.0)
MCV: 92.8 fL (ref 80.0–100.0)
Monocytes Absolute: 1.3 10*3/uL — ABNORMAL HIGH (ref 0.1–1.0)
Monocytes Relative: 15 %
Neutro Abs: 6.4 10*3/uL (ref 1.7–7.7)
Neutrophils Relative %: 71 %
Platelets: 344 10*3/uL (ref 150–400)
RBC: 4.15 MIL/uL (ref 3.87–5.11)
RDW: 13.2 % (ref 11.5–15.5)
WBC: 9.1 10*3/uL (ref 4.0–10.5)
nRBC: 0 % (ref 0.0–0.2)

## 2018-12-21 LAB — BASIC METABOLIC PANEL
Anion gap: 11 (ref 5–15)
BUN: 14 mg/dL (ref 8–23)
CO2: 22 mmol/L (ref 22–32)
Calcium: 8.2 mg/dL — ABNORMAL LOW (ref 8.9–10.3)
Chloride: 106 mmol/L (ref 98–111)
Creatinine, Ser: 0.6 mg/dL (ref 0.44–1.00)
GFR calc Af Amer: >60 mL/min (ref >60)
GFR calc non Af Amer: >60 mL/min (ref >60)
Glucose, Bld: 160 mg/dL — ABNORMAL HIGH (ref 70–99)
Potassium: 3.5 mmol/L (ref 3.5–5.1)
Sodium: 139 mmol/L (ref 135–145)

## 2018-12-21 LAB — GLUCOSE, CAPILLARY
Glucose-Capillary: 133 mg/dL — ABNORMAL HIGH (ref 70–99)
Glucose-Capillary: 166 mg/dL — ABNORMAL HIGH (ref 70–99)
Glucose-Capillary: 148 mg/dL — ABNORMAL HIGH (ref 70–99)
Glucose-Capillary: 131 mg/dL — ABNORMAL HIGH (ref 70–99)

## 2018-12-21 MED ORDER — POLYETHYLENE GLYCOL 3350 17 G PO PACK
17.0000 g | PACK | Freq: Every day | ORAL | Status: DC
  Administered 2018-12-21: 09:00:00 17 g via ORAL
  Filled 2018-12-21 (×2): qty 1

## 2018-12-21 NOTE — Progress Notes (Signed)
Subjective: She says she feels much better.  She had many bowel movements over the last 12 hours.  No abdominal pain.  No feeling of discomfort or bloating.  Dr. Arnoldo Morale has already seen her and removed her nasogastric tube  Objective: Vital signs in last 24 hours: Temp:  [98.6 F (37 C)-99.6 F (37.6 C)] 98.8 F (37.1 C) (05/25 0530) Pulse Rate:  [81-97] 84 (05/25 0530) Resp:  [16-18] 18 (05/25 0530) BP: (141-164)/(72-80) 162/72 (05/25 0530) SpO2:  [94 %-96 %] 94 % (05/25 0750) Weight change:  Last BM Date: 12/20/18  Intake/Output from previous day: 05/24 0701 - 05/25 0700 In: 0  Out: 2150 [Urine:2150]  PHYSICAL EXAM General appearance: alert, cooperative and no distress Resp: clear to auscultation bilaterally Cardio: regular rate and rhythm, S1, S2 normal, no murmur, click, rub or gallop GI: soft, non-tender; bowel sounds normal; no masses,  no organomegaly Extremities: extremities normal, atraumatic, no cyanosis or edema  Lab Results:  Results for orders placed or performed during the hospital encounter of 12/17/18 (from the past 48 hour(s))  Glucose, capillary     Status: Abnormal   Collection Time: 12/19/18 11:13 AM  Result Value Ref Range   Glucose-Capillary 145 (H) 70 - 99 mg/dL  Glucose, capillary     Status: Abnormal   Collection Time: 12/19/18  4:26 PM  Result Value Ref Range   Glucose-Capillary 149 (H) 70 - 99 mg/dL  Glucose, capillary     Status: Abnormal   Collection Time: 12/19/18  9:55 PM  Result Value Ref Range   Glucose-Capillary 149 (H) 70 - 99 mg/dL  Glucose, capillary     Status: Abnormal   Collection Time: 12/20/18  7:28 AM  Result Value Ref Range   Glucose-Capillary 169 (H) 70 - 99 mg/dL  Glucose, capillary     Status: Abnormal   Collection Time: 12/20/18 11:04 AM  Result Value Ref Range   Glucose-Capillary 160 (H) 70 - 99 mg/dL  Glucose, capillary     Status: Abnormal   Collection Time: 12/20/18  4:19 PM  Result Value Ref Range   Glucose-Capillary 157 (H) 70 - 99 mg/dL  Glucose, capillary     Status: Abnormal   Collection Time: 12/20/18  9:44 PM  Result Value Ref Range   Glucose-Capillary 142 (H) 70 - 99 mg/dL  Basic metabolic panel     Status: Abnormal   Collection Time: 12/21/18  6:23 AM  Result Value Ref Range   Sodium 139 135 - 145 mmol/L   Potassium 3.5 3.5 - 5.1 mmol/L   Chloride 106 98 - 111 mmol/L   CO2 22 22 - 32 mmol/L   Glucose, Bld 160 (H) 70 - 99 mg/dL   BUN 14 8 - 23 mg/dL   Creatinine, Ser 0.60 0.44 - 1.00 mg/dL   Calcium 8.2 (L) 8.9 - 10.3 mg/dL   GFR calc non Af Amer >60 >60 mL/min   GFR calc Af Amer >60 >60 mL/min   Anion gap 11 5 - 15    Comment: Performed at [facility], [address] [place], [place] [address]  CBC with Differential/Platelet     Status: Abnormal   Collection Time: 12/21/18  6:23 AM  Result Value Ref Range   WBC 9.1 4.0 - 10.5 K/uL   RBC 4.15 3.87 - 5.11 MIL/uL   Hemoglobin 12.2 12.0 - 15.0 g/dL   HCT 38.5 36.0 - 46.0 %   MCV 92.8 80.0 - 100.0 fL   MCH 29.4 26.0 -  34.0 pg   MCHC 31.7 30.0 - 36.0 g/dL   RDW 13.2 11.5 - 15.5 %   Platelets 344 150 - 400 K/uL   nRBC 0.0 0.0 - 0.2 %   Neutrophils Relative % 71 %   Neutro Abs 6.4 1.7 - 7.7 K/uL   Lymphocytes Relative 14 %   Lymphs Abs 1.3 0.7 - 4.0 K/uL   Monocytes Relative 15 %   Monocytes Absolute 1.3 (H) 0.1 - 1.0 K/uL   Eosinophils Relative 0 %   Eosinophils Absolute 0.0 0.0 - 0.5 K/uL   Basophils Relative 0 %   Basophils Absolute 0.0 0.0 - 0.1 K/uL   Immature Granulocytes 0 %   Abs Immature Granulocytes 0.04 0.00 - 0.07 K/uL    Comment: Performed at [facility], [address] [place], [place] [address]  Glucose, capillary     Status: Abnormal   Collection Time: 12/21/18  7:21 AM  Result Value Ref Range   Glucose-Capillary 133 (H) 70 - 99 mg/dL    ABGS No results for input(s): PHART, PO2ART, TCO2, HCO3 in the last 72 hours.  Invalid input(s): PCO2 CULTURES Recent Results (from the past 240  hour(s))  SARS Coronavirus 2 (CEPHEID - Performed in [place] hospital lab), Hosp Order     Status: None   Collection Time: 12/17/18 11:21 PM  Result Value Ref Range Status   SARS Coronavirus 2 NEGATIVE NEGATIVE Final    Comment: (NOTE) If result is NEGATIVE SARS-CoV-2 target nucleic acids are NOT DETECTED. The SARS-CoV-2 RNA is generally detectable in upper and lower  respiratory specimens during the acute phase of infection. The lowest  concentration of SARS-CoV-2 viral copies this assay can detect is 250  copies / mL. A negative result does not preclude SARS-CoV-2 infection  and should not be used as the sole basis for treatment or other  patient management decisions.  A negative result may occur with  improper specimen collection / handling, submission of specimen other  than nasopharyngeal swab, presence of viral mutation(s) within the  areas targeted by this assay, and inadequate number of viral copies  (<250 copies / mL). A negative result must be combined with clinical  observations, patient history, and epidemiological information. If result is POSITIVE SARS-CoV-2 target nucleic acids are DETECTED. The SARS-CoV-2 RNA is generally detectable in upper and lower  respiratory specimens dur ing the acute phase of infection.  Positive  results are indicative of active infection with SARS-CoV-2.  Clinical  correlation with patient history and other diagnostic information is  necessary to determine patient infection status.  Positive results do  not rule out bacterial infection or co-infection with other viruses. If result is PRESUMPTIVE POSTIVE SARS-CoV-2 nucleic acids MAY BE PRESENT.   A presumptive positive result was obtained on the submitted specimen  and confirmed on repeat testing.  While 2019 novel coronavirus  (SARS-CoV-2) nucleic acids may be present in the submitted sample  additional confirmatory testing may be necessary for epidemiological  and / or clinical  management purposes  to differentiate between  SARS-CoV-2 and other Sarbecovirus currently known to infect humans.  If clinically indicated additional testing with an alternate test  methodology [phone]) is advised. The SARS-CoV-2 RNA is generally  detectable in upper and lower respiratory sp ecimens during the acute  phase of infection. The expected result is Negative. Fact Sheet for Patients:  StrictlyIdeas.no Fact Sheet for Healthcare Providers: BankingDealers.co.za This test is not yet approved or cleared by the [place] FDA and  has been authorized for detection and/or diagnosis of SARS-CoV-2 by FDA under an Emergency Use Authorization (EUA).  This EUA will remain in effect (meaning this test can be used) for the duration of the COVID-19 declaration under Section 564(b)(1) of the Act, 21 U.S.C. section 360bbb-3(b)(1), unless the authorization is terminated or revoked sooner. Performed at [facility], [address] [place], [place] [address]    Studies/Results: No results found.  Medications:  Prior to Admission:  Medications Prior to Admission  Medication Sig Dispense Refill Last Dose  . albuterol (PROVENTIL HFA;VENTOLIN HFA) 108 (90 Base) MCG/ACT inhaler Inhale 2 puffs into the lungs every 6 (six) hours as needed for wheezing or shortness of breath.   Past Month at Unknown time  . amLODipine (NORVASC) 10 MG tablet Take 10 mg by mouth daily with breakfast.   12/15/2018  . aspirin EC 81 MG tablet Take 81 mg by mouth daily.    12/15/2018  . cetirizine (ZYRTEC) 10 MG tablet Take 10 mg by mouth daily.   12/15/2018  . chlorthalidone (HYGROTON) 25 MG tablet TAKE (1) TABLET BY MOUTH AT BEDTIME. 30 tablet 6 12/15/2018  . hydrALAZINE (APRESOLINE) 100 MG tablet Take 1 tablet (100 mg total) by mouth 3 (three) times daily. 270 tablet 3 12/15/2018  . LORazepam (ATIVAN) 1 MG tablet Take 1 mg by mouth every 8 (eight) hours as needed for anxiety.    12/15/2018  . Mesalamine (ASACOL HD) 800 MG TBEC Take 1 tablet by mouth 2 (two) times daily.   12/15/2018  . metFORMIN (GLUCOPHAGE-XR) 500 MG 24 hr tablet Take 500 mg by mouth every evening.    12/15/2018  . metoprolol tartrate (LOPRESSOR) 50 MG tablet TAKE (1) TABLET BY MOUTH TWICE DAILY. 60 tablet 6 12/17/2018 at 1700  . Multiple Vitamins-Minerals (CENTRUM SILVER PO) Take 1 tablet by mouth daily.    12/15/2018  . pantoprazole (PROTONIX) 40 MG tablet Take 40 mg by mouth daily.   11 12/15/2018  . potassium chloride SA (K-DUR,KLOR-CON) 20 MEQ tablet TAKE 2 TABLETS BY MOUTH EACH MORNING AND 1 TABLET IN THE EVENING. 90 tablet 6 12/15/2018  . pravastatin (PRAVACHOL) 40 MG tablet Take 40 mg by mouth every morning.    12/15/2018  . umeclidinium-vilanterol (ANORO ELLIPTA) 62.5-25 MCG/INH AEPB Inhale 1 puff into the lungs daily.   12/15/2018   Scheduled: . amLODipine  10 mg Oral Q breakfast  . heparin  5,000 Units Subcutaneous Q8H  . hydrALAZINE  100 mg Oral TID  . insulin aspart  0-5 Units Subcutaneous TID WC  . metoprolol tartrate  50 mg Oral BID  . polyethylene glycol  17 g Oral Daily  . sodium chloride flush  3 mL Intravenous Once  . sodium chloride flush  3 mL Intravenous Q12H  . umeclidinium-vilanterol  1 puff Inhalation Daily   Continuous: . sodium chloride    . 0.9 % NaCl with KCl 40 mEq / L 100 mL/hr (12/21/18 0344)  . famotidine (PEPCID) IV 20 mg (12/20/18 2222)   BSJ:GGEZMO chloride, acetaminophen **OR** acetaminophen, albuterol, labetalol, LORazepam, morphine injection, ondansetron **OR** ondansetron (ZOFRAN) IV, sodium chloride flush, traZODone  Assesment: She was admitted with small bowel obstruction likely secondary to adhesions from her previous surgery for bladder cancer.  She seemed to have released the obstruction yesterday.  She has history of bladder cancer  She has diabetes on sliding scale  She has hypertension which is well controlled Principal Problem:   SBO (small bowel  obstruction) ([place]) Active Problems:  Essential hypertension   IBD (inflammatory bowel disease)   Type 2 diabetes mellitus with chronic kidney disease and hypertension ([place])   History of bladder cancer    Plan: Continue treatments.  Start clear liquids and advance diet.  Probable discharge tomorrow assuming she does well    LOS: 3 days   Alonza Bogus 12/21/2018, 8:26 AM

## 2018-12-21 NOTE — Care Management Important Message (Signed)
Important Message  Patient Details  Name: April Gomez MRN: [id number] Date of Birth: 08/25/1945   Medicare Important Message Given:  Yes    Tommy Medal 12/21/2018, 12:01 PM

## 2018-12-21 NOTE — Progress Notes (Signed)
  Subjective: Patient has been having multiple bowel movements overnight.  Feels tired but her abdominal pain has resolved.  Objective: Vital signs in last 24 hours: Temp:  [98.6 F (37 C)-99.6 F (37.6 C)] 98.8 F (37.1 C) (05/25 0530) Pulse Rate:  [81-97] 84 (05/25 0530) Resp:  [16-18] 18 (05/25 0530) BP: (141-164)/(72-80) 162/72 (05/25 0530) SpO2:  [94 %-96 %] 94 % (05/25 0750) Last BM Date: 12/20/18  Intake/Output from previous day: 05/24 0701 - 05/25 0700 In: 0  Out: 2150 [Urine:2150] Intake/Output this shift: No intake/output data recorded.  General appearance: alert, cooperative and fatigued GI: soft, non-tender; bowel sounds normal; no masses,  no organomegaly  Lab Results:  Recent Labs    12/21/18 0623  WBC 9.1  HGB 12.2  HCT 38.5  PLT 344   BMET Recent Labs    12/19/18 0616 12/21/18 0623  NA 139 139  K 3.6 3.5  CL 106 106  CO2 26 22  GLUCOSE 137* 160*  BUN 15 14  CREATININE 0.70 0.60  CALCIUM 8.3* 8.2*   PT/INR No results for input(s): LABPROT, INR in the last 72 hours.  Studies/Results: No results found.  Anti-infectives: Anti-infectives (From admission, onward)   None      Assessment/Plan: Impression: Small bowel obstruction, resolved Plan: Will remove NG tube and advance diet as tolerated.  LOS: 3 days    Aviva Signs 12/21/2018

## 2018-12-22 LAB — GLUCOSE, CAPILLARY
Glucose-Capillary: 114 mg/dL — ABNORMAL HIGH (ref 70–99)
Glucose-Capillary: 139 mg/dL — ABNORMAL HIGH (ref 70–99)
Glucose-Capillary: 161 mg/dL — ABNORMAL HIGH (ref 70–99)

## 2018-12-22 NOTE — Discharge Summary (Signed)
Physician Discharge Summary  Patient ID: PERL FOLMAR MRN: [id number] DOB/AGE: Aug 27, 1945 73 y.o. 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   amLODipine 10 MG tablet Commonly known as:  NORVASC Take 10 mg by mouth daily with breakfast.   Anoro Ellipta 62.5-25 MCG/INH Aepb Generic drug:  umeclidinium-vilanterol Inhale 1 puff into the lungs daily.   Asacol HD 800 MG Tbec Generic drug:  Mesalamine Take 1 tablet by mouth 2 (two) times daily.   aspirin EC 81 MG tablet Take 81 mg by mouth daily.   CENTRUM SILVER PO Take 1 tablet by mouth daily.   cetirizine 10 MG tablet Commonly known as:  ZYRTEC Take 10 mg by mouth daily.   chlorthalidone 25 MG tablet Commonly known as:  HYGROTON TAKE (1) TABLET BY MOUTH AT BEDTIME.   hydrALAZINE 100 MG tablet Commonly known as:  APRESOLINE Take 1 tablet (100 mg total) by mouth 3 (three) times daily.   LORazepam 1 MG tablet Commonly known as:  ATIVAN Take 1 mg by mouth every 8 (eight) hours as needed for anxiety.   metFORMIN 500 MG 24 hr tablet Commonly known as:  GLUCOPHAGE-XR Take 500 mg by mouth every evening.   metoprolol tartrate 50 MG tablet Commonly known as:  LOPRESSOR TAKE (1) TABLET BY MOUTH TWICE DAILY.   pantoprazole 40 MG tablet Commonly known  as:  PROTONIX Take 40 mg by mouth daily.   potassium chloride SA 20 MEQ tablet Commonly known as:  K-DUR TAKE 2 TABLETS BY MOUTH EACH MORNING AND 1 TABLET IN THE EVENING.   pravastatin 40 MG tablet Commonly known as:  PRAVACHOL Take 40 mg by mouth every morning.       Discharged Condition: Improved    Consults: Surgery, Dr. Arnoldo Morale  Significant Diagnostic Studies: Ct Abdomen Pelvis W Contrast  Result Date: 12/17/2018 CLINICAL DATA:  Abdominal pain with nausea and vomiting EXAM: CT ABDOMEN AND PELVIS WITH CONTRAST TECHNIQUE: Multidetector CT imaging of the abdomen and pelvis was performed using the standard protocol following bolus administration of intravenous contrast. CONTRAST:  158mL OMNIPAQUE IOHEXOL 300 MG/ML  SOLN COMPARISON:  06/10/2012 FINDINGS: Lower chest: Lung bases demonstrate emphysematous disease. No acute consolidation or effusion. Borderline to mild cardiomegaly. Hepatobiliary: Hypodensity at the dome of the liver measuring 18 mm, with increased internal density values. Additional subcentimeter hypodensities in the liver too small to further characterize. No calcified gallstone or biliary dilatation Pancreas: Unremarkable. No pancreatic ductal dilatation or surrounding inflammatory changes. Spleen: Normal in size without focal abnormality. Adrenals/Urinary Tract: Adrenal glands appear within normal limits. Interval cystectomy. Multiple subcentimeter hypodense cortical lesions in the kidneys, too small to further characterize. Multifocal scarring within the left kidney. No hydronephrosis. No hydroureter. Urostomy to the right lower quadrant. Stomach/Bowel: Stomach nonenlarged. Multiple dilated loops of fluid-filled small  bowel measuring up to 3.3 cm in the left lower quadrant. Beaked appearance of distal small bowel in the left low pelvis felt consistent with transition point. No colon wall thickening. Right lower quadrant large parastomal hernia containing mesenteric fat and  colon. No obstruction. Vascular/Lymphatic: Extensive aortic atherosclerosis. No aneurysm. No significantly enlarged lymph nodes. Reproductive: Status post hysterectomy Other: Negative for free air or free fluid Musculoskeletal: No acute or suspicious osseous abnormality. IMPRESSION: 1. Multiple dilated loops of fluid-filled small bowel within the left abdomen and pelvis, consistent with mechanical small bowel obstruction, likely due to adhesions. Probable transition point within the low left pelvis just cephalad to the level of left superior pubic ramus. 2. Interval cystectomy and creation of right lower quadrant urinary diversion. Moderate to large right parastomal hernia containing mesenteric fat and colon but no evidence for obstruction or strangulation. 3. Interim finding of multifocal cortical scarring within the left kidney 4. Emphysematous disease 5. Indeterminate hypodensity at the dome of the liver. This is in the region of a previously noted cyst but now appears smaller in size with increased internal density. Electronically Signed   By: Donavan Foil M.D.   On: 12/17/2018 22:35   Dg Abd Portable 1 View  Result Date: 12/18/2018 CLINICAL DATA:  73 year old female with abdominal pain nausea and vomiting. EXAM: PORTABLE ABDOMEN - 1 VIEW COMPARISON:  CT Abdomen and Pelvis yesterday FINDINGS: Portable AP supine view at 0022 hours. Enteric tube has been placed with side hole at the level of the gastric body. Persistent small bowel obstruction gas pattern with gas-filled loops dilated up to 43 millimeters. Stable gas pattern since yesterday. Stable lung bases. No definite pneumoperitoneum on this supine view. IMPRESSION: 1. Enteric tube placed, side hole at the level of the gastric body. 2. Stable small bowel obstruction gas pattern. Electronically Signed   By: Genevie Ann M.D.   On: 12/18/2018 00:55    Lab Results: Basic Metabolic Panel: Recent Labs    12/21/18 0623  NA 139  K 3.5  CL 106  CO2 22   GLUCOSE 160*  BUN 14  CREATININE 0.60  CALCIUM 8.2*   Liver Function Tests: No results for input(s): AST, ALT, ALKPHOS, BILITOT, PROT, ALBUMIN in the last 72 hours.   CBC: Recent Labs    12/21/18 0623  WBC 9.1  NEUTROABS 6.4  HGB 12.2  HCT 38.5  MCV 92.8  PLT 344    Recent Results (from the past 240 hour(s))  SARS Coronavirus 2 (CEPHEID - Performed in [place] hospital lab), Hosp Order     Status: None   Collection Time: 12/17/18 11:21 PM  Result Value Ref Range Status   SARS Coronavirus 2 NEGATIVE NEGATIVE Final    Comment: (NOTE) If result is NEGATIVE SARS-CoV-2 target nucleic acids are NOT DETECTED. The SARS-CoV-2 RNA is generally detectable in upper and lower  respiratory specimens during the acute phase of infection. The lowest  concentration of SARS-CoV-2 viral copies this assay can detect is 250  copies / mL. A negative result does not preclude SARS-CoV-2 infection  and should not be used as the sole basis for treatment or other  patient management decisions.  A negative result may occur with  improper specimen collection / handling, submission of specimen other  than nasopharyngeal swab, presence of viral mutation(s) within the  areas targeted by this assay, and inadequate number of viral copies  (<250 copies / mL). A negative result must be combined with clinical  observations, patient history,  and epidemiological information. If result is POSITIVE SARS-CoV-2 target nucleic acids are DETECTED. The SARS-CoV-2 RNA is generally detectable in upper and lower  respiratory specimens dur ing the acute phase of infection.  Positive  results are indicative of active infection with SARS-CoV-2.  Clinical  correlation with patient history and other diagnostic information is  necessary to determine patient infection status.  Positive results do  not rule out bacterial infection or co-infection with other viruses. If result is PRESUMPTIVE POSTIVE SARS-CoV-2 nucleic  acids MAY BE PRESENT.   A presumptive positive result was obtained on the submitted specimen  and confirmed on repeat testing.  While 2019 novel coronavirus  (SARS-CoV-2) nucleic acids may be present in the submitted sample  additional confirmatory testing may be necessary for epidemiological  and / or clinical management purposes  to differentiate between  SARS-CoV-2 and other Sarbecovirus currently known to infect humans.  If clinically indicated additional testing with an alternate test  methodology [phone]) is advised. The SARS-CoV-2 RNA is generally  detectable in upper and lower respiratory sp ecimens during the acute  phase of infection. The expected result is Negative. Fact Sheet for Patients:  StrictlyIdeas.no Fact Sheet for Healthcare Providers: BankingDealers.co.za This test is not yet approved or cleared by the [place] FDA and has been authorized for detection and/or diagnosis of SARS-CoV-2 by FDA under an Emergency Use Authorization (EUA).  This EUA will remain in effect (meaning this test can be used) for the duration of the COVID-19 declaration under Section 564(b)(1) of the Act, 21 U.S.C. section 360bbb-3(b)(1), unless the authorization is terminated or revoked sooner. Performed at [facility], [address], [place], [place] [address]      Hospital Course: This is a 73 year old who came to the emergency department because of abdominal pain and distention and nausea and vomiting.  She was unable to have a bowel movement.  CT in the emergency department showed that she had a small bowel obstruction.  This was felt to likely be related to her previous cystectomy and ileostomy creation with adhesions.  She was treated conservatively with nasogastric tube and released her obstruction on the 25th.  She is able to tolerate soft diet and was discharged home.  Discharge Exam: Blood pressure (!) 147/73, pulse 81, temperature  98.9 F (37.2 C), temperature source Oral, resp. rate 20, height 5\' 5"  (1.651 m), weight 78.8 kg, SpO2 96 %. She is awake and alert.  Abdomen is soft.  Chest is clear.  Disposition: Home      Signed: Alonza Bogus   12/22/2018, 8:27 AM

## 2018-12-22 NOTE — Plan of Care (Signed)

## 2018-12-22 NOTE — Progress Notes (Signed)
Subjective: She says she feels better.  She has had numerous bowel movements.  She took liquids yesterday without any trouble.  No abdominal pain.  Objective: Vital signs in last 24 hours: Temp:  [97.3 F (36.3 C)-98.9 F (37.2 C)] 98.9 F (37.2 C) (05/26 0616) Pulse Rate:  [81-85] 81 (05/26 0616) Resp:  [17-20] 20 (05/26 0616) BP: (147-148)/(62-73) 147/73 (05/26 0616) SpO2:  [94 %-96 %] 96 % (05/26 0746) Weight change:  Last BM Date: 12/21/18  Intake/Output from previous day: 05/25 0701 - 05/26 0700 In: 7132 [P.O.:480; I.V.:6352; IV Piggyback:300] Out: 1300 [Urine:1300]  PHYSICAL EXAM General appearance: alert, cooperative and no distress Resp: clear to auscultation bilaterally Cardio: regular rate and rhythm, S1, S2 normal, no murmur, click, rub or gallop GI: soft, non-tender; bowel sounds normal; no masses,  no organomegaly Extremities: extremities normal, atraumatic, no cyanosis or edema  Lab Results:  Results for orders placed or performed during the hospital encounter of 12/17/18 (from the past 48 hour(s))  Glucose, capillary     Status: Abnormal   Collection Time: 12/20/18 11:04 AM  Result Value Ref Range   Glucose-Capillary 160 (H) 70 - 99 mg/dL  Glucose, capillary     Status: Abnormal   Collection Time: 12/20/18  4:19 PM  Result Value Ref Range   Glucose-Capillary 157 (H) 70 - 99 mg/dL  Glucose, capillary     Status: Abnormal   Collection Time: 12/20/18  9:44 PM  Result Value Ref Range   Glucose-Capillary 142 (H) 70 - 99 mg/dL  Basic metabolic panel     Status: Abnormal   Collection Time: 12/21/18  6:23 AM  Result Value Ref Range   Sodium 139 135 - 145 mmol/L   Potassium 3.5 3.5 - 5.1 mmol/L   Chloride 106 98 - 111 mmol/L   CO2 22 22 - 32 mmol/L   Glucose, Bld 160 (H) 70 - 99 mg/dL   BUN 14 8 - 23 mg/dL   Creatinine, Ser 0.60 0.44 - 1.00 mg/dL   Calcium 8.2 (L) 8.9 - 10.3 mg/dL   GFR calc non Af Amer >60 >60 mL/min   GFR calc Af Amer >60 >60 mL/min    Anion gap 11 5 - 15    Comment: Performed at [facility], [address], [place], [place] [address]  CBC with Differential/Platelet     Status: Abnormal   Collection Time: 12/21/18  6:23 AM  Result Value Ref Range   WBC 9.1 4.0 - 10.5 K/uL   RBC 4.15 3.87 - 5.11 MIL/uL   Hemoglobin 12.2 12.0 - 15.0 g/dL   HCT 38.5 36.0 - 46.0 %   MCV 92.8 80.0 - 100.0 fL   MCH 29.4 26.0 - 34.0 pg   MCHC 31.7 30.0 - 36.0 g/dL   RDW 13.2 11.5 - 15.5 %   Platelets 344 150 - 400 K/uL   nRBC 0.0 0.0 - 0.2 %   Neutrophils Relative % 71 %   Neutro Abs 6.4 1.7 - 7.7 K/uL   Lymphocytes Relative 14 %   Lymphs Abs 1.3 0.7 - 4.0 K/uL   Monocytes Relative 15 %   Monocytes Absolute 1.3 (H) 0.1 - 1.0 K/uL   Eosinophils Relative 0 %   Eosinophils Absolute 0.0 0.0 - 0.5 K/uL   Basophils Relative 0 %   Basophils Absolute 0.0 0.0 - 0.1 K/uL   Immature Granulocytes 0 %   Abs Immature Granulocytes 0.04 0.00 - 0.07 K/uL    Comment: Performed at [facility]  [address] [place],  [address]  Glucose, capillary     Status: Abnormal   Collection Time: 12/21/18  7:21 AM  Result Value Ref Range   Glucose-Capillary 133 (H) 70 - 99 mg/dL  Glucose, capillary     Status: Abnormal   Collection Time: 12/21/18 11:18 AM  Result Value Ref Range   Glucose-Capillary 166 (H) 70 - 99 mg/dL  Glucose, capillary     Status: Abnormal   Collection Time: 12/21/18  4:10 PM  Result Value Ref Range   Glucose-Capillary 148 (H) 70 - 99 mg/dL  Glucose, capillary     Status: Abnormal   Collection Time: 12/21/18  9:04 PM  Result Value Ref Range   Glucose-Capillary 131 (H) 70 - 99 mg/dL  Glucose, capillary     Status: Abnormal   Collection Time: 12/22/18  7:48 AM  Result Value Ref Range   Glucose-Capillary 114 (H) 70 - 99 mg/dL    ABGS No results for input(s): PHART, PO2ART, TCO2, HCO3 in the last 72 hours.  Invalid input(s): PCO2 CULTURES Recent Results (from the past 240 hour(s))  SARS Coronavirus 2 (CEPHEID -  Performed in [place] hospital lab), Hosp Order     Status: None   Collection Time: 12/17/18 11:21 PM  Result Value Ref Range Status   SARS Coronavirus 2 NEGATIVE NEGATIVE Final    Comment: (NOTE) If result is NEGATIVE SARS-CoV-2 target nucleic acids are NOT DETECTED. The SARS-CoV-2 RNA is generally detectable in upper and lower  respiratory specimens during the acute phase of infection. The lowest  concentration of SARS-CoV-2 viral copies this assay can detect is 250  copies / mL. A negative result does not preclude SARS-CoV-2 infection  and should not be used as the sole basis for treatment or other  patient management decisions.  A negative result may occur with  improper specimen collection / handling, submission of specimen other  than nasopharyngeal swab, presence of viral mutation(s) within the  areas targeted by this assay, and inadequate number of viral copies  (<250 copies / mL). A negative result must be combined with clinical  observations, patient history, and epidemiological information. If result is POSITIVE SARS-CoV-2 target nucleic acids are DETECTED. The SARS-CoV-2 RNA is generally detectable in upper and lower  respiratory specimens dur ing the acute phase of infection.  Positive  results are indicative of active infection with SARS-CoV-2.  Clinical  correlation with patient history and other diagnostic information is  necessary to determine patient infection status.  Positive results do  not rule out bacterial infection or co-infection with other viruses. If result is PRESUMPTIVE POSTIVE SARS-CoV-2 nucleic acids MAY BE PRESENT.   A presumptive positive result was obtained on the submitted specimen  and confirmed on repeat testing.  While 2019 novel coronavirus  (SARS-CoV-2) nucleic acids may be present in the submitted sample  additional confirmatory testing may be necessary for epidemiological  and / or clinical management purposes  to differentiate between   SARS-CoV-2 and other Sarbecovirus currently known to infect humans.  If clinically indicated additional testing with an alternate test  methodology [phone]) is advised. The SARS-CoV-2 RNA is generally  detectable in upper and lower respiratory sp ecimens during the acute  phase of infection. The expected result is Negative. Fact Sheet for Patients:  StrictlyIdeas.no Fact Sheet for Healthcare Providers: BankingDealers.co.za This test is not yet approved or cleared by the [place] FDA and has been authorized for detection and/or diagnosis of SARS-CoV-2 by FDA under  an Emergency Use Authorization (EUA).  This EUA will remain in effect (meaning this test can be used) for the duration of the COVID-19 declaration under Section 564(b)(1) of the Act, 21 U.S.C. section 360bbb-3(b)(1), unless the authorization is terminated or revoked sooner. Performed at [facility], [address] [place], [place] [address]    Studies/Results: No results found.  Medications:  Prior to Admission:  Medications Prior to Admission  Medication Sig Dispense Refill Last Dose  . albuterol (PROVENTIL HFA;VENTOLIN HFA) 108 (90 Base) MCG/ACT inhaler Inhale 2 puffs into the lungs every 6 (six) hours as needed for wheezing or shortness of breath.   Past Month at Unknown time  . amLODipine (NORVASC) 10 MG tablet Take 10 mg by mouth daily with breakfast.   12/15/2018  . aspirin EC 81 MG tablet Take 81 mg by mouth daily.    12/15/2018  . cetirizine (ZYRTEC) 10 MG tablet Take 10 mg by mouth daily.   12/15/2018  . chlorthalidone (HYGROTON) 25 MG tablet TAKE (1) TABLET BY MOUTH AT BEDTIME. 30 tablet 6 12/15/2018  . hydrALAZINE (APRESOLINE) 100 MG tablet Take 1 tablet (100 mg total) by mouth 3 (three) times daily. 270 tablet 3 12/15/2018  . LORazepam (ATIVAN) 1 MG tablet Take 1 mg by mouth every 8 (eight) hours as needed for anxiety.   12/15/2018  . Mesalamine (ASACOL HD) 800 MG  TBEC Take 1 tablet by mouth 2 (two) times daily.   12/15/2018  . metFORMIN (GLUCOPHAGE-XR) 500 MG 24 hr tablet Take 500 mg by mouth every evening.    12/15/2018  . metoprolol tartrate (LOPRESSOR) 50 MG tablet TAKE (1) TABLET BY MOUTH TWICE DAILY. 60 tablet 6 12/17/2018 at 1700  . Multiple Vitamins-Minerals (CENTRUM SILVER PO) Take 1 tablet by mouth daily.    12/15/2018  . pantoprazole (PROTONIX) 40 MG tablet Take 40 mg by mouth daily.   11 12/15/2018  . potassium chloride SA (K-DUR,KLOR-CON) 20 MEQ tablet TAKE 2 TABLETS BY MOUTH EACH MORNING AND 1 TABLET IN THE EVENING. 90 tablet 6 12/15/2018  . pravastatin (PRAVACHOL) 40 MG tablet Take 40 mg by mouth every morning.    12/15/2018  . umeclidinium-vilanterol (ANORO ELLIPTA) 62.5-25 MCG/INH AEPB Inhale 1 puff into the lungs daily.   12/15/2018   Scheduled: . amLODipine  10 mg Oral Q breakfast  . heparin  5,000 Units Subcutaneous Q8H  . hydrALAZINE  100 mg Oral TID  . insulin aspart  0-5 Units Subcutaneous TID WC  . metoprolol tartrate  50 mg Oral BID  . polyethylene glycol  17 g Oral Daily  . sodium chloride flush  3 mL Intravenous Once  . sodium chloride flush  3 mL Intravenous Q12H  . umeclidinium-vilanterol  1 puff Inhalation Daily   Continuous: . sodium chloride    . 0.9 % NaCl with KCl 40 mEq / L 100 mL/hr at 12/22/18 0500  . famotidine (PEPCID) IV Stopped (12/21/18 2159)   NAT:FTDDUK chloride, acetaminophen **OR** acetaminophen, albuterol, labetalol, LORazepam, ondansetron **OR** ondansetron (ZOFRAN) IV, sodium chloride flush, traZODone  Assesment: She was admitted with small bowel obstruction.  She seems to have released the obstruction yesterday morning.  She has had numerous bowel movements.  Her NG tube is been removed.  She has no new complaints.  She has tolerated liquids  She has history of bladder cancer and had a cystectomy and ileostomy creation in the she probably had adhesions from that  She has hypertension which is pretty well  controlled  He  has COPD mostly chronic bronchitis and that is stable  She has diabetes on sliding scale but I am going to put her on a soft non-diabetic diet to see if she can tolerate it Principal Problem:   SBO (small bowel obstruction) ([facility]) Active Problems:   Essential hypertension   IBD (inflammatory bowel disease)   Type 2 diabetes mellitus with chronic kidney disease and hypertension ([place])   History of bladder cancer    Plan: Advance diet.  Probable discharge later today    LOS: 4 days   Alonza Bogus 12/22/2018, 8:23 AM

## 2018-12-22 NOTE — Progress Notes (Signed)
  Subjective: Patient tolerating liquid diet well.  Has had multiple bowel movements over the past 24 hours.  Denies any abdominal pain.  Objective: Vital signs in last 24 hours: Temp:  [97.3 F (36.3 C)-98.9 F (37.2 C)] 98.9 F (37.2 C) (05/26 0616) Pulse Rate:  [81-85] 81 (05/26 0616) Resp:  [17-20] 20 (05/26 0616) BP: (147-148)/(62-73) 147/73 (05/26 0616) SpO2:  [94 %-96 %] 96 % (05/26 0746) Last BM Date: 12/21/18  Intake/Output from previous day: 05/25 0701 - 05/26 0700 In: 7132 [P.O.:480; I.V.:6352; IV Piggyback:300] Out: 1300 [Urine:1300] Intake/Output this shift: No intake/output data recorded.  General appearance: alert, cooperative and no distress GI: soft, non-tender; bowel sounds normal; no masses,  no organomegaly  Lab Results:  Recent Labs    12/21/18 0623  WBC 9.1  HGB 12.2  HCT 38.5  PLT 344   BMET Recent Labs    12/21/18 0623  NA 139  K 3.5  CL 106  CO2 22  GLUCOSE 160*  BUN 14  CREATININE 0.60  CALCIUM 8.2*   PT/INR No results for input(s): LABPROT, INR in the last 72 hours.  Studies/Results: No results found.  Anti-infectives: Anti-infectives (From admission, onward)   None      Assessment/Plan: Impression: Small bowel obstruction, resolved Plan: If she tolerates regular diet, okay for discharge.  LOS: 4 days    Aviva Signs 12/22/2018

## 2019-01-04 DIAGNOSIS — K5669 Other partial intestinal obstruction: Secondary | ICD-10-CM | POA: Diagnosis not present

## 2019-01-04 DIAGNOSIS — E119 Type 2 diabetes mellitus without complications: Secondary | ICD-10-CM | POA: Diagnosis not present

## 2019-01-04 DIAGNOSIS — K219 Gastro-esophageal reflux disease without esophagitis: Secondary | ICD-10-CM | POA: Diagnosis not present

## 2019-01-04 DIAGNOSIS — I10 Essential (primary) hypertension: Secondary | ICD-10-CM | POA: Diagnosis not present

## 2019-01-11 ENCOUNTER — Other Ambulatory Visit (HOSPITAL_COMMUNITY): Payer: Self-pay | Admitting: Pulmonary Disease

## 2019-01-11 DIAGNOSIS — Z1231 Encounter for screening mammogram for malignant neoplasm of breast: Secondary | ICD-10-CM

## 2019-01-15 ENCOUNTER — Other Ambulatory Visit: Payer: Self-pay | Admitting: Cardiology

## 2019-02-22 ENCOUNTER — Ambulatory Visit (HOSPITAL_COMMUNITY)
Admission: RE | Admit: 2019-02-22 | Discharge: 2019-02-22 | Disposition: A | Discharge status: Home / Self Care | Payer: Medicare Other | Source: Ambulatory Visit | Attending: Pulmonary Disease | Admitting: Pulmonary Disease

## 2019-02-22 ENCOUNTER — Other Ambulatory Visit: Payer: Self-pay

## 2019-02-22 DIAGNOSIS — Z1231 Encounter for screening mammogram for malignant neoplasm of breast: Secondary | ICD-10-CM

## 2019-03-04 DIAGNOSIS — J301 Allergic rhinitis due to pollen: Secondary | ICD-10-CM | POA: Diagnosis not present

## 2019-03-04 DIAGNOSIS — J410 Simple chronic bronchitis: Secondary | ICD-10-CM | POA: Diagnosis not present

## 2019-03-04 DIAGNOSIS — I10 Essential (primary) hypertension: Secondary | ICD-10-CM | POA: Diagnosis not present

## 2019-03-04 DIAGNOSIS — E119 Type 2 diabetes mellitus without complications: Secondary | ICD-10-CM | POA: Diagnosis not present

## 2019-03-11 ENCOUNTER — Telehealth: Payer: Self-pay | Admitting: Cardiology

## 2019-03-11 NOTE — Telephone Encounter (Signed)

## 2019-03-17 ENCOUNTER — Other Ambulatory Visit: Payer: Self-pay | Admitting: Cardiology

## 2019-04-08 DIAGNOSIS — Z936 Other artificial openings of urinary tract status: Secondary | ICD-10-CM | POA: Diagnosis not present

## 2019-04-08 DIAGNOSIS — C679 Malignant neoplasm of bladder, unspecified: Secondary | ICD-10-CM | POA: Diagnosis not present

## 2019-04-08 DIAGNOSIS — Z8551 Personal history of malignant neoplasm of bladder: Secondary | ICD-10-CM | POA: Diagnosis not present

## 2019-04-22 ENCOUNTER — Encounter: Payer: Self-pay | Admitting: Cardiology

## 2019-04-22 ENCOUNTER — Telehealth (INDEPENDENT_AMBULATORY_CARE_PROVIDER_SITE_OTHER): Payer: Medicare Other | Admitting: Cardiology

## 2019-04-22 VITALS — BP 128/70 | HR 77 | Ht 65.5 in | Wt 162.0 lb

## 2019-04-22 DIAGNOSIS — R002 Palpitations: Secondary | ICD-10-CM

## 2019-04-22 DIAGNOSIS — I10 Essential (primary) hypertension: Secondary | ICD-10-CM | POA: Diagnosis not present

## 2019-04-22 DIAGNOSIS — E782 Mixed hyperlipidemia: Secondary | ICD-10-CM

## 2019-04-22 NOTE — Progress Notes (Signed)
Virtual Visit via Telephone Note   This visit type was conducted due to national recommendations for restrictions regarding the COVID-19 Pandemic (e.g. social distancing) in an effort to limit this patient's exposure and mitigate transmission in our community.  Due to her co-morbid illnesses, this patient is at least at moderate risk for complications without adequate follow up.  This format is felt to be most appropriate for this patient at this time.  The patient did not have access to video technology/had technical difficulties with video requiring transitioning to audio format only (telephone).  All issues noted in this document were discussed and addressed.  No physical exam could be performed with this format.  Please refer to the patient's chart for her  consent to telehealth for [facility].   Date:  04/22/2019   ID:  April Gomez, DOB Feb 24, 1946, MRN [id number]  Patient Location: Home Provider Location: Home  PCP:  Sinda Du, MD  Cardiologist:  Carlyle Dolly, MD  Electrophysiologist:  None   Evaluation Performed:  Follow-Up Visit  Chief Complaint:  Follow up visit  History of Present Illness:    April Gomez is a 73 y.o. female seen today for follow up of the following medical problem.s  1. HTN - intolerant to ACE-I due to tongue swelling  - compliant with meds  2. Palpitations - 02/2016 holter no arrhythmias  09/2017 monitor with benign ectopy.    - no recent palpitation.   3. Hyperlipidemia - labs followed pcp - compliant with statin    The patient does not have symptoms concerning for COVID-19 infection (fever, chills, cough, or new shortness of breath).    Past Medical History:  Diagnosis Date  . DDD (degenerative disc disease) CERVICAL AND LUMBAR  . Diabetes mellitus   . Hematuria   . History of bladder cancer 12/25/2012  . Hyperlipidemia   . Hypertension   . IBD (inflammatory bowel disease)   . Prolapse of vaginal vault after  hysterectomy 12/25/2012  . PVC's (premature ventricular contractions)    a. Holter 2017: NSR, rare ventricular ectopy; palpitations correlated with NSR.  Marland Kitchen Urgency of urination    Past Surgical History:  Procedure Laterality Date  . ABDOMINAL HYSTERECTOMY    . BREAST BIOPSY  03-20-2011  DR [facility]   BENIGN LEFT BREAST MASS  . BUNIONECTOMY  1980'S  . CYSTOSCOPY/RETROGRADE/URETEROSCOPY  05/29/2012   Procedure: CYSTOSCOPY/RETROGRADE/URETEROSCOPY;  Surgeon: Claybon Jabs, MD;  [facility]: [facility];  Service: Urology;  Laterality: Right;   right  Retrograde Pyelogram   . HYSTEROSCOPY W/D&C  04/17/2012   Procedure: DILATATION AND CURETTAGE /HYSTEROSCOPY;  Surgeon: Jonnie Kind, MD;  Location: [facility];  Service: Gynecology;  Laterality: N/A;  POLYPECTOMY  . TRANSURETHRAL RESECTION OF BLADDER TUMOR  05/29/2012   Procedure: TRANSURETHRAL RESECTION OF BLADDER TUMOR (TURBT);  Surgeon: Claybon Jabs, MD;  Location: [facility];  Service: Urology;  Laterality: N/A;  [place]  . TUBAL LIGATION  1984     No outpatient medications have been marked as taking for the 04/22/19 encounter (Appointment) with Arnoldo Lenis, MD.     Allergies:   Lisinopril and Ceftin [cefuroxime axetil]   Social History   Tobacco Use  . Smoking status: Former Smoker    Years: 10.00    Types: Cigarettes    Quit date: 05/26/1988    Years since quitting: 30.9  . Smokeless tobacco: Never Used  Substance Use Topics  . Alcohol use: No  . Drug  use: No     Family Hx: The patient's family history includes COPD in her father; Cancer in her brother and sister; Diabetes in her brother; Heart attack in her sister; Heart disease in her mother; Heart disease (age of onset: 54) in her sister.  ROS:   Please see the history of present illness.     All other systems reviewed and are negative.   Prior CV studies:   The following studies were reviewed today:  02/2016 holter  Rhythm throughout  study is normal sinus rhythm  No supraventricular ectopy, rare ventricular ectopy.  Min HR 61, Max HR 98, Avg HR 72  Reported symptoms of palpitations correlated with normal sinus rhythm  12/2015 Carotid US IMPRESSION: 1. Mild bilateral carotid bifurcation and proximal ICA plaque, resulting in less than 50% diameter stenosis. The exam does not exclude plaque ulceration or embolization. Continued surveillance recommended. 2. Antegrade bilateral vertebral arterial flow.  Labs/Other Tests and Data Reviewed:    EKG:  No ECG reviewed.  Recent Labs: 12/18/2018: Magnesium 2.3 12/19/2018: ALT 13 12/21/2018: BUN 14; Creatinine, Ser 0.60; Hemoglobin 12.2; Platelets 344; Potassium 3.5; Sodium 139   Recent Lipid Panel No results found for: CHOL, TRIG, HDL, CHOLHDL, LDLCALC, LDLDIRECT  Wt Readings from Last 3 Encounters:  12/20/18 173 lb 11.6 oz (78.8 kg)  08/24/18 165 lb (74.8 kg)  03/02/18 170 lb (77.1 kg)     Objective:    Vital Signs:   Today's Vitals   04/22/19 0811  BP: 128/70  Pulse: 77  Weight: 162 lb (73.5 kg)  Height: 5' 5.5" (1.664 m)   Body mass index is 26.55 kg/m.  Normal affect. Normal speech pattern and tone. Comfortable, no apparent distress. No audible signs of SOB or wheezing.   ASSESSMENT & PLAN:    1. HTN -at goal, continue current meds   2. Palpitaitons -no recent symptoms, continue beta blocker  3. Hyperlipidemia - request pcp labs, continue statin   F/u 1 year  COVID-19 Education: The signs and symptoms of COVID-19 were discussed with the patient and how to seek care for testing (follow up with PCP or arrange E-visit).  The importance of social distancing was discussed today.  Time:   Today, I have spent 15 minutes with the patient with telehealth technology discussing the above problems.     Medication Adjustments/Labs and Tests Ordered: Current medicines are reviewed at length with the patient today.  Concerns regarding medicines  are outlined above.   Tests Ordered: No orders of the defined types were placed in this encounter.   Medication Changes: No orders of the defined types were placed in this encounter.   Follow Up:  In Person in 1 year(s)  Signed, Carlyle Dolly, MD  04/22/2019 7:33 AM    [place]

## 2019-04-22 NOTE — Patient Instructions (Signed)

## 2019-04-27 ENCOUNTER — Other Ambulatory Visit: Payer: Self-pay | Admitting: Physician Assistant

## 2019-06-03 DIAGNOSIS — E119 Type 2 diabetes mellitus without complications: Secondary | ICD-10-CM | POA: Diagnosis not present

## 2019-06-03 DIAGNOSIS — J410 Simple chronic bronchitis: Secondary | ICD-10-CM | POA: Diagnosis not present

## 2019-06-03 DIAGNOSIS — I10 Essential (primary) hypertension: Secondary | ICD-10-CM | POA: Diagnosis not present

## 2019-06-03 DIAGNOSIS — F419 Anxiety disorder, unspecified: Secondary | ICD-10-CM | POA: Diagnosis not present

## 2019-06-17 ENCOUNTER — Other Ambulatory Visit: Payer: Self-pay | Admitting: Cardiology

## 2019-07-08 DIAGNOSIS — I10 Essential (primary) hypertension: Secondary | ICD-10-CM | POA: Diagnosis not present

## 2019-07-08 DIAGNOSIS — Z0189 Encounter for other specified special examinations: Secondary | ICD-10-CM | POA: Diagnosis not present

## 2019-07-08 DIAGNOSIS — E785 Hyperlipidemia, unspecified: Secondary | ICD-10-CM | POA: Diagnosis not present

## 2019-07-08 DIAGNOSIS — J449 Chronic obstructive pulmonary disease, unspecified: Secondary | ICD-10-CM | POA: Diagnosis not present

## 2019-08-05 DIAGNOSIS — I10 Essential (primary) hypertension: Secondary | ICD-10-CM | POA: Diagnosis not present

## 2019-08-05 DIAGNOSIS — E785 Hyperlipidemia, unspecified: Secondary | ICD-10-CM | POA: Diagnosis not present

## 2019-08-05 DIAGNOSIS — J449 Chronic obstructive pulmonary disease, unspecified: Secondary | ICD-10-CM | POA: Diagnosis not present

## 2019-08-05 DIAGNOSIS — Z1329 Encounter for screening for other suspected endocrine disorder: Secondary | ICD-10-CM | POA: Diagnosis not present

## 2019-09-14 ENCOUNTER — Other Ambulatory Visit: Payer: Self-pay | Admitting: Cardiology

## 2019-09-24 ENCOUNTER — Other Ambulatory Visit: Payer: Self-pay | Admitting: Cardiology

## 2019-10-15 ENCOUNTER — Other Ambulatory Visit: Payer: Self-pay | Admitting: Cardiology

## 2019-11-06 ENCOUNTER — Ambulatory Visit: Payer: Medicare Other | Attending: Internal Medicine

## 2019-11-06 DIAGNOSIS — Z23 Encounter for immunization: Secondary | ICD-10-CM

## 2019-11-06 NOTE — Progress Notes (Signed)
   Covid-19 Vaccination Clinic  Name:  April Gomez    MRN: [id number] DOB: July 22, 1946  11/06/2019  [name] was observed post Covid-19 immunization for 15 minutes without incident. She was provided with Vaccine Information Sheet and instruction to access the V-Safe system.   [name] was instructed to call 911 with any severe reactions post vaccine: Marland Kitchen Difficulty breathing  . Swelling of face and throat  . A fast heartbeat  . A bad rash all over body  . Dizziness and weakness   Immunizations Administered    Name Date Dose VIS Date Route   Moderna COVID-19 Vaccine 11/06/2019  9:43 AM 0.5 mL 06/29/2019 Intramuscular   Manufacturer: Moderna   Lot: [id number]   [id number]

## 2019-11-11 DIAGNOSIS — I10 Essential (primary) hypertension: Secondary | ICD-10-CM | POA: Diagnosis not present

## 2019-11-11 DIAGNOSIS — E782 Mixed hyperlipidemia: Secondary | ICD-10-CM | POA: Diagnosis not present

## 2019-11-11 DIAGNOSIS — E785 Hyperlipidemia, unspecified: Secondary | ICD-10-CM | POA: Diagnosis not present

## 2019-11-11 DIAGNOSIS — J449 Chronic obstructive pulmonary disease, unspecified: Secondary | ICD-10-CM | POA: Diagnosis not present

## 2019-11-11 DIAGNOSIS — Z0189 Encounter for other specified special examinations: Secondary | ICD-10-CM | POA: Diagnosis not present

## 2019-11-11 DIAGNOSIS — E1065 Type 1 diabetes mellitus with hyperglycemia: Secondary | ICD-10-CM | POA: Diagnosis not present

## 2019-11-18 DIAGNOSIS — J449 Chronic obstructive pulmonary disease, unspecified: Secondary | ICD-10-CM | POA: Diagnosis not present

## 2019-11-18 DIAGNOSIS — F411 Generalized anxiety disorder: Secondary | ICD-10-CM | POA: Diagnosis not present

## 2019-11-18 DIAGNOSIS — E1165 Type 2 diabetes mellitus with hyperglycemia: Secondary | ICD-10-CM | POA: Diagnosis not present

## 2019-11-18 DIAGNOSIS — E785 Hyperlipidemia, unspecified: Secondary | ICD-10-CM | POA: Diagnosis not present

## 2019-11-18 DIAGNOSIS — E782 Mixed hyperlipidemia: Secondary | ICD-10-CM | POA: Diagnosis not present

## 2019-11-18 DIAGNOSIS — I10 Essential (primary) hypertension: Secondary | ICD-10-CM | POA: Diagnosis not present

## 2019-11-18 DIAGNOSIS — Z0001 Encounter for general adult medical examination with abnormal findings: Secondary | ICD-10-CM | POA: Diagnosis not present

## 2019-12-04 ENCOUNTER — Ambulatory Visit: Payer: Medicare Other | Attending: Internal Medicine

## 2019-12-04 DIAGNOSIS — Z23 Encounter for immunization: Secondary | ICD-10-CM

## 2019-12-04 NOTE — Progress Notes (Signed)
   Covid-19 Vaccination Clinic  Name:  GARDA JUHNKE    MRN: [id number] DOB: 03/23/1946  12/04/2019  Ms. Reimer was observed post Covid-19 immunization for 15 minutes without incident. She was provided with Vaccine Information Sheet and instruction to access the V-Safe system.   Ms. Rusaw was instructed to call 911 with any severe reactions post vaccine: Marland Kitchen Difficulty breathing  . Swelling of face and throat  . A fast heartbeat  . A bad rash all over body  . Dizziness and weakness   Immunizations Administered    Name Date Dose VIS Date Route   Moderna COVID-19 Vaccine 12/04/2019  9:22 AM 0.5 mL 06/2019 Intramuscular   Manufacturer: Moderna   Lot: [id number]   [place]:[id number]

## 2019-12-16 ENCOUNTER — Other Ambulatory Visit: Payer: Self-pay | Admitting: Cardiology

## 2020-01-18 ENCOUNTER — Other Ambulatory Visit: Payer: Self-pay

## 2020-01-18 ENCOUNTER — Other Ambulatory Visit (HOSPITAL_COMMUNITY): Payer: Self-pay | Admitting: Internal Medicine

## 2020-01-18 DIAGNOSIS — Z1231 Encounter for screening mammogram for malignant neoplasm of breast: Secondary | ICD-10-CM

## 2020-01-18 MED ORDER — PRAVASTATIN SODIUM 40 MG PO TABS: 40.0000 mg | ORAL_TABLET | Freq: Every morning | ORAL | 6 refills | Status: DC

## 2020-01-18 MED ORDER — POTASSIUM CHLORIDE CRYS ER 20 MEQ PO TBCR: EXTENDED_RELEASE_TABLET | ORAL | 3 refills | Status: DC

## 2020-01-18 MED ORDER — HYDRALAZINE HCL 100 MG PO TABS: 100.0000 mg | ORAL_TABLET | Freq: Three times a day (TID) | ORAL | 1 refills | Status: DC

## 2020-01-18 MED ORDER — AMLODIPINE BESYLATE 10 MG PO TABS: 10.0000 mg | ORAL_TABLET | Freq: Every day | ORAL | 6 refills | Status: DC

## 2020-01-18 MED ORDER — CHLORTHALIDONE 25 MG PO TABS: ORAL_TABLET | ORAL | 6 refills | Status: DC

## 2020-01-18 MED ORDER — METOPROLOL TARTRATE 50 MG PO TABS: ORAL_TABLET | ORAL | 3 refills | Status: DC

## 2020-01-18 NOTE — Telephone Encounter (Signed)
Refilled cardiac meds, due for f/u in sept 2021

## 2020-02-24 ENCOUNTER — Ambulatory Visit (HOSPITAL_COMMUNITY)
Admission: RE | Admit: 2020-02-24 | Discharge: 2020-02-24 | Disposition: A | Discharge status: Home / Self Care | Payer: Medicare Other | Source: Ambulatory Visit | Attending: Internal Medicine | Admitting: Internal Medicine

## 2020-02-24 ENCOUNTER — Other Ambulatory Visit: Payer: Self-pay

## 2020-02-24 DIAGNOSIS — E785 Hyperlipidemia, unspecified: Secondary | ICD-10-CM | POA: Diagnosis not present

## 2020-02-24 DIAGNOSIS — Z1231 Encounter for screening mammogram for malignant neoplasm of breast: Secondary | ICD-10-CM | POA: Insufficient documentation

## 2020-02-24 DIAGNOSIS — I10 Essential (primary) hypertension: Secondary | ICD-10-CM | POA: Diagnosis not present

## 2020-02-24 DIAGNOSIS — E1165 Type 2 diabetes mellitus with hyperglycemia: Secondary | ICD-10-CM | POA: Diagnosis not present

## 2020-02-24 DIAGNOSIS — E782 Mixed hyperlipidemia: Secondary | ICD-10-CM | POA: Diagnosis not present

## 2020-02-24 DIAGNOSIS — J449 Chronic obstructive pulmonary disease, unspecified: Secondary | ICD-10-CM | POA: Diagnosis not present

## 2020-03-02 DIAGNOSIS — J449 Chronic obstructive pulmonary disease, unspecified: Secondary | ICD-10-CM | POA: Diagnosis not present

## 2020-03-02 DIAGNOSIS — I10 Essential (primary) hypertension: Secondary | ICD-10-CM | POA: Diagnosis not present

## 2020-03-02 DIAGNOSIS — E785 Hyperlipidemia, unspecified: Secondary | ICD-10-CM | POA: Diagnosis not present

## 2020-03-02 DIAGNOSIS — E782 Mixed hyperlipidemia: Secondary | ICD-10-CM | POA: Diagnosis not present

## 2020-03-02 DIAGNOSIS — E1065 Type 1 diabetes mellitus with hyperglycemia: Secondary | ICD-10-CM | POA: Diagnosis not present

## 2020-04-07 DIAGNOSIS — C678 Malignant neoplasm of overlapping sites of bladder: Secondary | ICD-10-CM | POA: Diagnosis not present

## 2020-04-07 DIAGNOSIS — K435 Parastomal hernia without obstruction or  gangrene: Secondary | ICD-10-CM | POA: Diagnosis not present

## 2020-04-07 DIAGNOSIS — K469 Unspecified abdominal hernia without obstruction or gangrene: Secondary | ICD-10-CM | POA: Diagnosis not present

## 2020-04-07 DIAGNOSIS — C679 Malignant neoplasm of bladder, unspecified: Secondary | ICD-10-CM | POA: Diagnosis not present

## 2020-05-03 ENCOUNTER — Encounter: Payer: Self-pay | Admitting: Student

## 2020-05-03 ENCOUNTER — Ambulatory Visit (INDEPENDENT_AMBULATORY_CARE_PROVIDER_SITE_OTHER): Payer: Medicare Other | Admitting: Student

## 2020-05-03 ENCOUNTER — Other Ambulatory Visit: Payer: Self-pay

## 2020-05-03 ENCOUNTER — Ambulatory Visit: Payer: Medicare Other | Admitting: Cardiology

## 2020-05-03 ENCOUNTER — Encounter: Payer: Self-pay | Admitting: *Deleted

## 2020-05-03 VITALS — BP 132/68 | HR 72 | Ht 65.5 in | Wt 157.0 lb

## 2020-05-03 DIAGNOSIS — I10 Essential (primary) hypertension: Secondary | ICD-10-CM | POA: Diagnosis not present

## 2020-05-03 DIAGNOSIS — E782 Mixed hyperlipidemia: Secondary | ICD-10-CM

## 2020-05-03 DIAGNOSIS — R002 Palpitations: Secondary | ICD-10-CM

## 2020-05-03 MED ORDER — POTASSIUM CHLORIDE CRYS ER 20 MEQ PO TBCR: EXTENDED_RELEASE_TABLET | ORAL | 11 refills | Status: DC

## 2020-05-03 MED ORDER — PRAVASTATIN SODIUM 40 MG PO TABS: 40.0000 mg | ORAL_TABLET | Freq: Every morning | ORAL | 11 refills | Status: DC

## 2020-05-03 MED ORDER — AMLODIPINE BESYLATE 10 MG PO TABS: 10.0000 mg | ORAL_TABLET | Freq: Every day | ORAL | 11 refills | Status: DC

## 2020-05-03 MED ORDER — HYDRALAZINE HCL 100 MG PO TABS: 100.0000 mg | ORAL_TABLET | Freq: Three times a day (TID) | ORAL | 11 refills | Status: DC

## 2020-05-03 MED ORDER — METOPROLOL TARTRATE 50 MG PO TABS: 50.0000 mg | ORAL_TABLET | Freq: Two times a day (BID) | ORAL | 11 refills | Status: DC

## 2020-05-03 MED ORDER — CHLORTHALIDONE 25 MG PO TABS: 25.0000 mg | ORAL_TABLET | Freq: Every day | ORAL | 11 refills | Status: DC

## 2020-05-03 NOTE — Progress Notes (Signed)
Cardiology Office Note    Date:  05/03/2020   ID:  April Gomez, DOB Jan 02, 1946, MRN [id number]  PCP:  Celene Squibb, MD  Cardiologist: Carlyle Dolly, MD    Chief Complaint  Patient presents with   Follow-up    Annual Visit    History of Present Illness:    April Gomez is a 74 y.o. female with past medical history of HTN, HLD, Type 2 DM, IBD and palpitations (PVC's by prior monitor) who presents to the office today for annual follow-up.   She most recently had a telehealth visit with Dr. Harl Bowie in 03/2019 and denied any recent chest pain or palpitations at the time of her visit. Vitals had been well controlled and she was continued on her current medication regimen and informed to follow-up in 1 year.  In talking the patient today, she reports overall doing well from a cardiac perspective since her last visit. She has been walking at a local track several days a week for 30-minute intervals and denies any recent chest pain or dyspnea on exertion with this. No recent orthopnea, PND or lower extremity edema. She denies any recurrent palpitations resembling her prior symptoms. Reports good compliance with her current medication regimen.   Past Medical History:  Diagnosis Date   DDD (degenerative disc disease) CERVICAL AND LUMBAR   Diabetes mellitus    Hematuria    History of bladder cancer 12/25/2012   Hyperlipidemia    Hypertension    IBD (inflammatory bowel disease)    Prolapse of vaginal vault after hysterectomy 12/25/2012   PVC's (premature ventricular contractions)    a. Holter 2017: NSR, rare ventricular ectopy; palpitations correlated with NSR.   Urgency of urination     Past Surgical History:  Procedure Laterality Date   ABDOMINAL HYSTERECTOMY     BREAST BIOPSY  03-20-2011  DR [facility]   BENIGN LEFT BREAST MASS   BUNIONECTOMY  1980'S   CYSTOSCOPY/RETROGRADE/URETEROSCOPY  05/29/2012   Procedure: CYSTOSCOPY/RETROGRADE/URETEROSCOPY;  Surgeon: Claybon Jabs, MD;  Location[facility];  Service: Urology;  Laterality: Right;   right  Retrograde Pyelogram    HYSTEROSCOPY WITH D & C  04/17/2012   Procedure: DILATATION AND CURETTAGE /HYSTEROSCOPY;  Surgeon: Jonnie Kind, MD;  Location: [facility];  Service: Gynecology;  Laterality: N/A;  POLYPECTOMY   TRANSURETHRAL RESECTION OF BLADDER TUMOR  05/29/2012   Procedure: TRANSURETHRAL RESECTION OF BLADDER TUMOR (TURBT);  Surgeon: Claybon Jabs, MD;  [facility];  Service: Urology;  Laterality: N/A;  [place]   TUBAL LIGATION  1984    Current Medications: Outpatient Medications Prior to Visit  Medication Sig Dispense Refill   albuterol (PROVENTIL HFA;VENTOLIN HFA) 108 (90 Base) MCG/ACT inhaler Inhale 2 puffs into the lungs every 6 (six) hours as needed for wheezing or shortness of breath.     aspirin EC 81 MG tablet Take 81 mg by mouth daily.      cetirizine (ZYRTEC) 10 MG tablet Take 10 mg by mouth daily.     Cholecalciferol (VITAMIN D3) 1.25 MG (50000 UT) CAPS Take by mouth.     LORazepam (ATIVAN) 1 MG tablet Take 1 mg by mouth every 8 (eight) hours as needed for anxiety.     Mesalamine (ASACOL HD) 800 MG TBEC Take 1 tablet by mouth 2 (two) times daily.     metFORMIN (GLUCOPHAGE-XR) 500 MG 24 hr tablet Take 500 mg by mouth every evening.  Multiple Vitamins-Minerals (CENTRUM SILVER PO) Take 1 tablet by mouth daily.      pantoprazole (PROTONIX) 40 MG tablet Take 40 mg by mouth daily.   11   umeclidinium-vilanterol (ANORO ELLIPTA) 62.5-25 MCG/INH AEPB Inhale 1 puff into the lungs daily.     amLODipine (NORVASC) 10 MG tablet Take 1 tablet (10 mg total) by mouth daily with breakfast. 30 tablet 6   chlorthalidone (HYGROTON) 25 MG tablet I tablet daily at bedtime 30 tablet 6   hydrALAZINE (APRESOLINE) 100 MG tablet Take 1 tablet (100 mg total) by mouth 3 (three) times daily. 270 tablet 1   metoprolol tartrate (LOPRESSOR) 50 MG tablet TAKE (1)  TABLET BY MOUTH TWICE DAILY. 180 tablet 3   potassium chloride SA (KLOR-CON) 20 MEQ tablet TAKE 2 TABLETS BY MOUTH EACH MORNING AND 1 TABLET IN THE EVENING. 270 tablet 3   pravastatin (PRAVACHOL) 40 MG tablet Take 1 tablet (40 mg total) by mouth every morning. 30 tablet 6   Facility-Administered Medications Prior to Visit  Medication Dose Route Frequency Provider Last Rate Last Admin   mitomycin (MUTAMYCIN) chemo injection 40 mg  40 mg Bladder Instillation Once Kathie Rhodes, MD         Allergies:   Lisinopril and Ceftin [cefuroxime axetil]   Social History   Socioeconomic History   Marital status: Married    Spouse name: Not on file   Number of children: Not on file   Years of education: Not on file   Highest education level: Not on file  Occupational History   Not on file  Tobacco Use   Smoking status: Former Smoker    Years: 10.00    Types: Cigarettes    Quit date: 05/26/1988    Years since quitting: 31.9   Smokeless tobacco: Never Used  Vaping Use   Vaping Use: Never used  Substance and Sexual Activity   Alcohol use: No   Drug use: No   Sexual activity: Never  Other Topics Concern   Not on file  Social History Narrative   Not on file   Social Determinants of Health   Financial Resource Strain:    Difficulty of Paying Living Expenses: Not on file  Food Insecurity:    Worried About Charity fundraiser in the Last Year: Not on file   [employer] of Food in the Last Year: Not on file  Transportation Needs:    Lack of Transportation (Medical): Not on file   Lack of Transportation (Non-Medical): Not on file  Physical Activity:    Days of Exercise per Week: Not on file   Minutes of Exercise per Session: Not on file  Stress:    Feeling of Stress : Not on file  Social Connections:    Frequency of Communication with Friends and Family: Not on file   Frequency of Social Gatherings with Friends and Family: Not on file   Attends Religious  Services: Not on file   Active Member of Clubs or Organizations: Not on file   Attends Archivist Meetings: Not on file   Marital Status: Not on file     Family History:  The patient's family history includes COPD in her father; Cancer in her brother and sister; Diabetes in her brother; Heart attack in her sister; Heart disease in her mother; Heart disease (age of onset: 64) in her sister.   Review of Systems:   Please see the history of present illness.     General:  No chills, fever, night sweats or weight changes.  Cardiovascular:  No chest pain, dyspnea on exertion, edema, orthopnea, palpitations, paroxysmal nocturnal dyspnea. Dermatological: No rash, lesions/masses Respiratory: No cough, dyspnea Urologic: No hematuria, dysuria Abdominal:   No nausea, vomiting, diarrhea, bright red blood per rectum, melena, or hematemesis Neurologic:  No visual changes, wkns, changes in mental status. All other systems reviewed and are otherwise negative except as noted above.   Physical Exam:    VS:  BP 132/68    Pulse 72    Ht 5' 5.5" (1.664 m)    Wt 157 lb (71.2 kg)    SpO2 95%    BMI 25.73 kg/m    General: Well developed, well nourished,female appearing in no acute distress. Head: Normocephalic, atraumatic. Neck: No carotid bruits. JVD not elevated.  Lungs: Respirations regular and unlabored, without wheezes or rales.  Heart: Regular rate and rhythm. No S3 or S4.  No murmur, no rubs, or gallops appreciated. Abdomen: Appears non-distended. No obvious abdominal masses. Msk:  Strength and tone appear normal for age. No obvious joint deformities or effusions. Extremities: No clubbing or cyanosis. No lower extremity edema.  Distal pedal pulses are 2+ bilaterally. Neuro: Alert and oriented X 3. Moves all extremities spontaneously. No focal deficits noted. Psych:  Responds to questions appropriately with a normal affect. Skin: No rashes or lesions noted  Wt Readings from Last 3  Encounters:  05/03/20 157 lb (71.2 kg)  04/22/19 162 lb (73.5 kg)  12/20/18 173 lb 11.6 oz (78.8 kg)     Studies/Labs Reviewed:   EKG:  EKG is ordered today. The ekg ordered today demonstrates NSR, HR 68 with nonspecific ST abnormality along lateral leads which is similar to prior tracings. No acute changes.   Recent Labs: No results found for requested labs within last 8760 hours.   Lipid Panel No results found for: CHOL, TRIG, HDL, CHOLHDL, VLDL, LDLCALC, LDLDIRECT  Additional studies/ records that were reviewed today include:   Holter monitor: 09/2017  Min HR 63, Max HR 101, Avg HR 77. Primary rhythm is sinus  No supraventricular ectopy  Rare ventricular ectopy, all in the form of isolated PVCs and couplets  Reported symptoms correlate with sinus rhythm with occasional PVCs  No significant sustained arrhythmias   Echocardiogram: 09/2017 Study Conclusions   - Left ventricle: The cavity size was normal. Wall thickness was  increased in a pattern of moderate LVH. Systolic function was  normal. The estimated ejection fraction was in the range of 60%  to 65%. Wall motion was normal; there were no regional wall  motion abnormalities. Doppler parameters are consistent with  abnormal left ventricular relaxation (grade 1 diastolic  dysfunction).  - Aortic valve: Valve area (VTI): 2.23 cm^2. Valve area (Vmax):  1.82 cm^2. Valve area (Vmean): 2 cm^2.  - Left atrium: The atrium was moderately dilated.  - Right atrium: The atrium was mildly dilated.  - Atrial septum: No defect or patent foramen ovale was identified.  - Pulmonary arteries: Systolic pressure was mildly increased. PA  peak pressure: 37 mm Hg (S).  - Technically adequate study.   Assessment:    1. Palpitations   2. Essential hypertension   3. Mixed hyperlipidemia      Plan:   In order of problems listed above:  1. Palpitations - She did have PVC's by prior monitoring as outlined above  but denies any recent symptoms. She does not consume caffeine or alcohol.  Will continue with current medical therapy with  Lopressor 50 mg twice daily.    2. HTN - BP was initially elevated at 144/78, rechecked and improved to 132/68. This has overall been well controlled at home. Continue current medication regimen with Amlodipine 10 mg daily, Chlorthalidone 25 mg daily, Hydralazine 100 mg 3 times daily and Lopressor 50 mg twice daily. Will request a copy of most recent labs from her PCP.   3. HLD - Followed by her PCP. I will request a copy of recent records. She remains on Pravastatin 40 mg daily.   Medication Adjustments/Labs and Tests Ordered: Current medicines are reviewed at length with the patient today.  Concerns regarding medicines are outlined above.  Medication changes, Labs and Tests ordered today are listed in the Patient Instructions below. Patient Instructions  Medication Instructions:  Your physician recommends that you continue on your current medications as directed. Please refer to the Current Medication list given to you today.  *If you need a refill on your cardiac medications before your next appointment, please call your pharmacy*   Lab Work: NONE   If you have labs (blood work) drawn today and your tests are completely normal, you will receive your results only by:  [place] (if you have MyChart) OR  A paper copy in the mail If you have any lab test that is abnormal or we need to change your treatment, we will call you to review the results.   Testing/Procedures: NONE    Follow-Up: At [facility], you and your health needs are our priority.  As part of our continuing mission to provide you with exceptional heart care, we have created designated Provider Care Teams.  These Care Teams include your primary Cardiologist (physician) and Advanced Practice Providers (APPs -  Physician Assistants and Nurse Practitioners) who all work together to provide  you with the care you need, when you need it.  We recommend signing up for the patient portal called "MyChart".  Sign up information is provided on this After Visit Summary.  MyChart is used to connect with patients for Virtual Visits (Telemedicine).  Patients are able to view lab/test results, encounter notes, upcoming appointments, etc.  Non-urgent messages can be sent to your provider as well.   To learn more about what you can do with MyChart, go to [email or web address].    Your next appointment:   1 year(s)  The format for your next appointment:   In Person  Provider:   Carlyle Dolly, MD or Bernerd Pho, PA-C   Other Instructions Thank you for choosing [facility]!       Signed, Erma Heritage, PA-C  05/03/2020 5:17 PM    [place] [address] [place], [place] [address] Phone: [phone] Fax: [phone]

## 2020-05-03 NOTE — Patient Instructions (Signed)
Medication Instructions:  Your physician recommends that you continue on your current medications as directed. Please refer to the Current Medication list given to you today.  *If you need a refill on your cardiac medications before your next appointment, please call your pharmacy*   Lab Work: NONE   If you have labs (blood work) drawn today and your tests are completely normal, you will receive your results only by: MyChart Message (if you have MyChart) OR A paper copy in the mail If you have any lab test that is abnormal or we need to change your treatment, we will call you to review the results.   Testing/Procedures: NONE    Follow-Up: At [facility], you and your health needs are our priority.  As part of our continuing mission to provide you with exceptional heart care, we have created designated Provider Care Teams.  These Care Teams include your primary Cardiologist (physician) and Advanced Practice Providers (APPs -  Physician Assistants and Nurse Practitioners) who all work together to provide you with the care you need, when you need it.  We recommend signing up for the patient portal called "MyChart".  Sign up information is provided on this After Visit Summary.  MyChart is used to connect with patients for Virtual Visits (Telemedicine).  Patients are able to view lab/test results, encounter notes, upcoming appointments, etc.  Non-urgent messages can be sent to your provider as well.   To learn more about what you can do with MyChart, go to [email or web address].    Your next appointment:   1 year(s)  The format for your next appointment:   In Person  Provider:   Jonathan Branch, MD or Brittany Strader, PA-C    Other Instructions Thank you for choosing [facility]!    

## 2020-05-17 IMAGING — MG DIGITAL SCREENING BILATERAL MAMMOGRAM WITH TOMO AND CAD
8 series · 9 of 24 positions shown · non-contrast
Comparison: Previous exam(s).

CLINICAL DATA: Screening.

EXAM:
DIGITAL SCREENING BILATERAL MAMMOGRAM WITH TOMO AND CAD

[L MLO synth-2D]
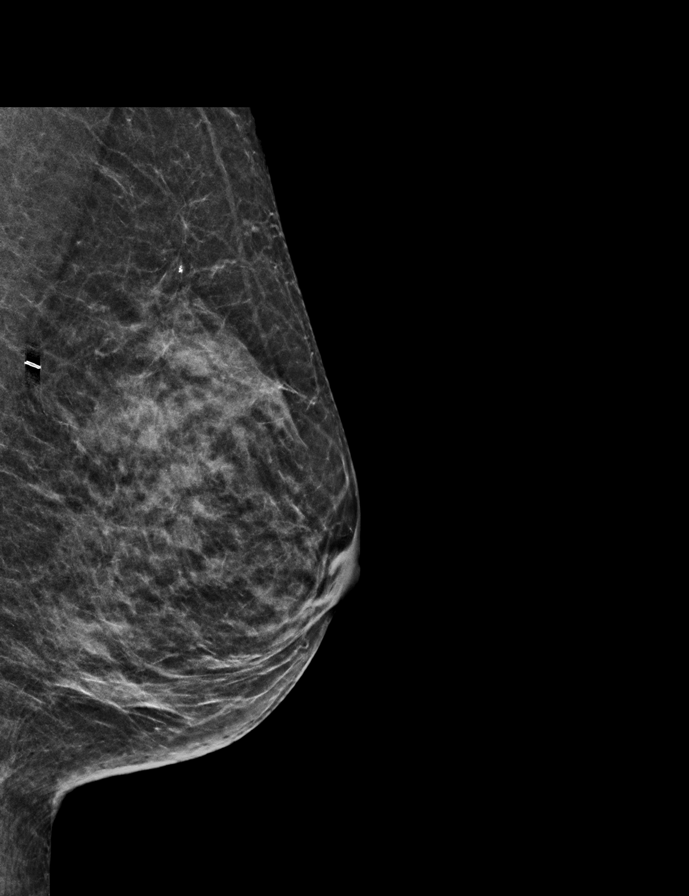

[R CC synth-2D]
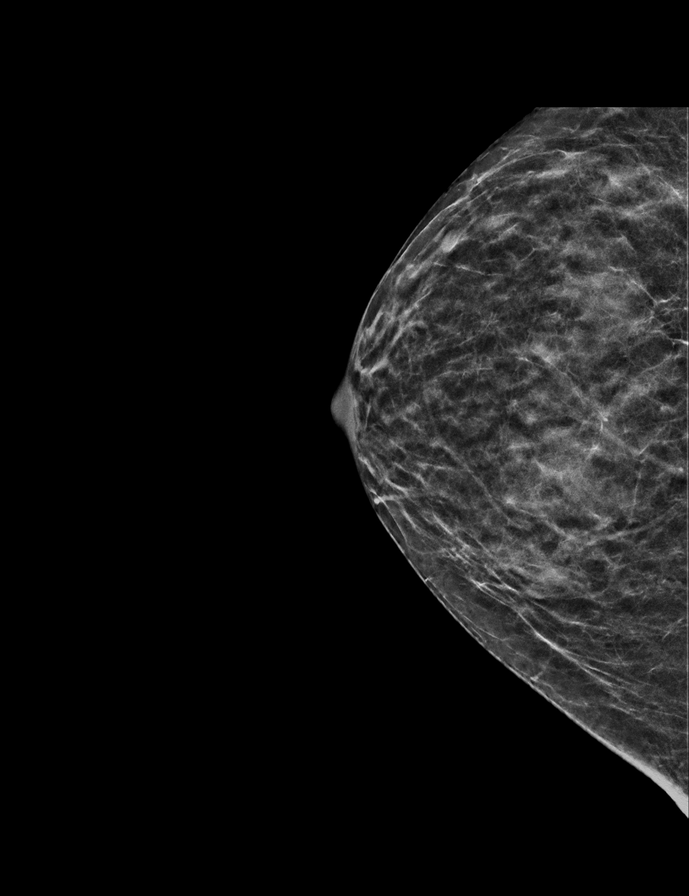

[L CC synth-2D]
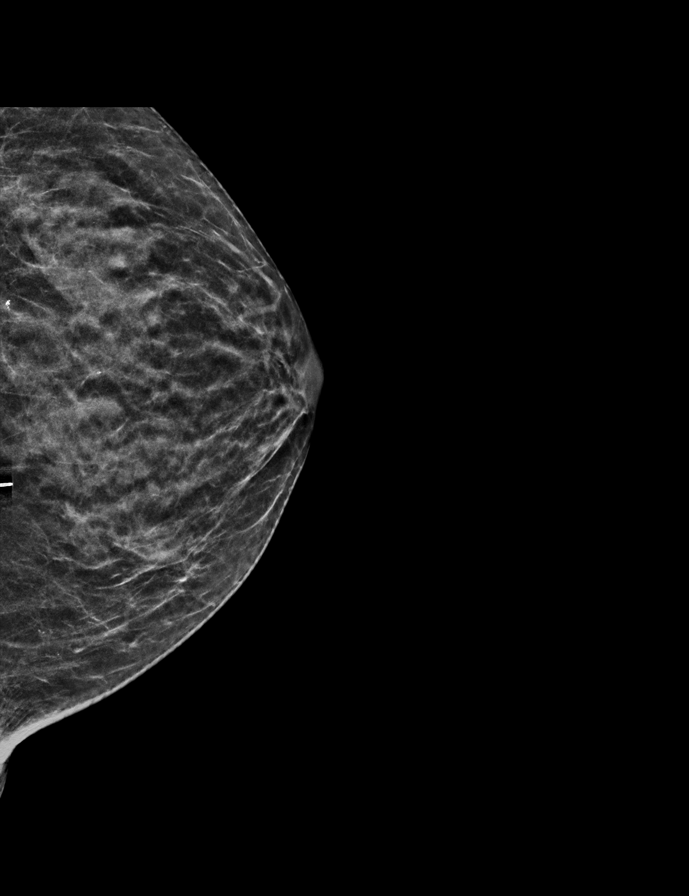

[R MLO synth-2D]
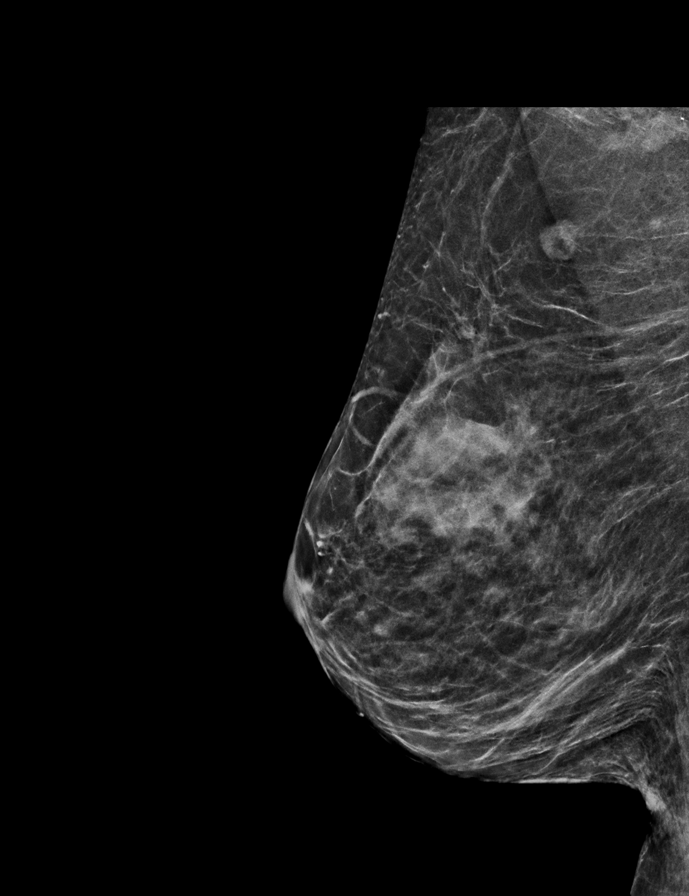

[R CC tomo · 2 of 42 frames shown]
[frame 14/42]
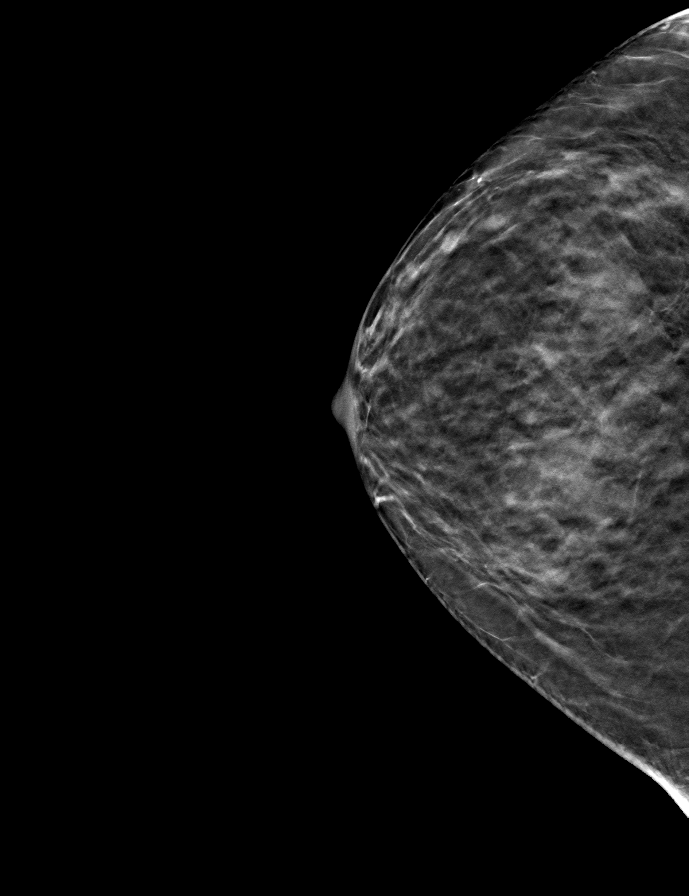
[frame 21/42]
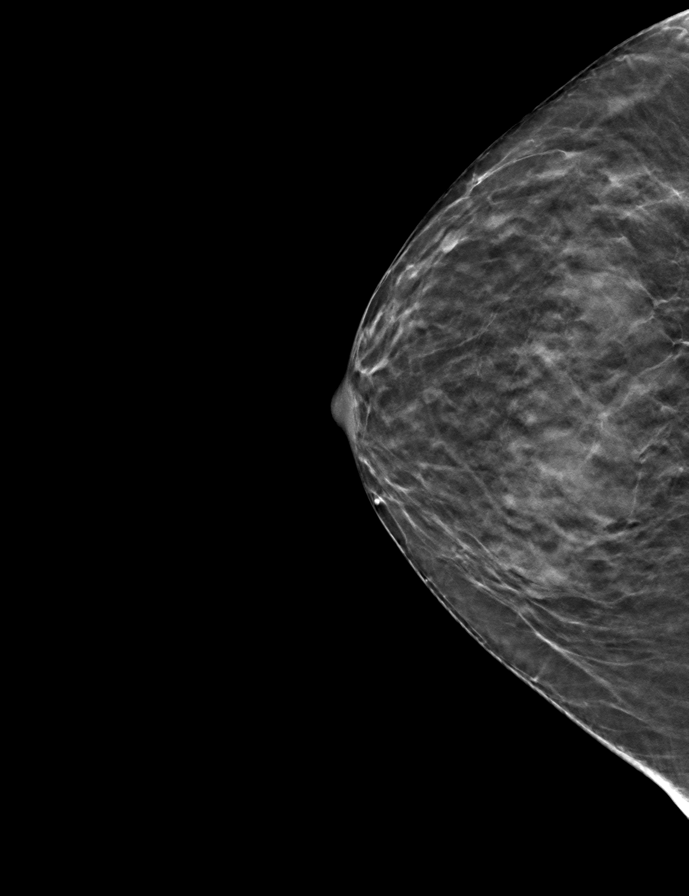

[L CC tomo · tomo slice 23/46.0]
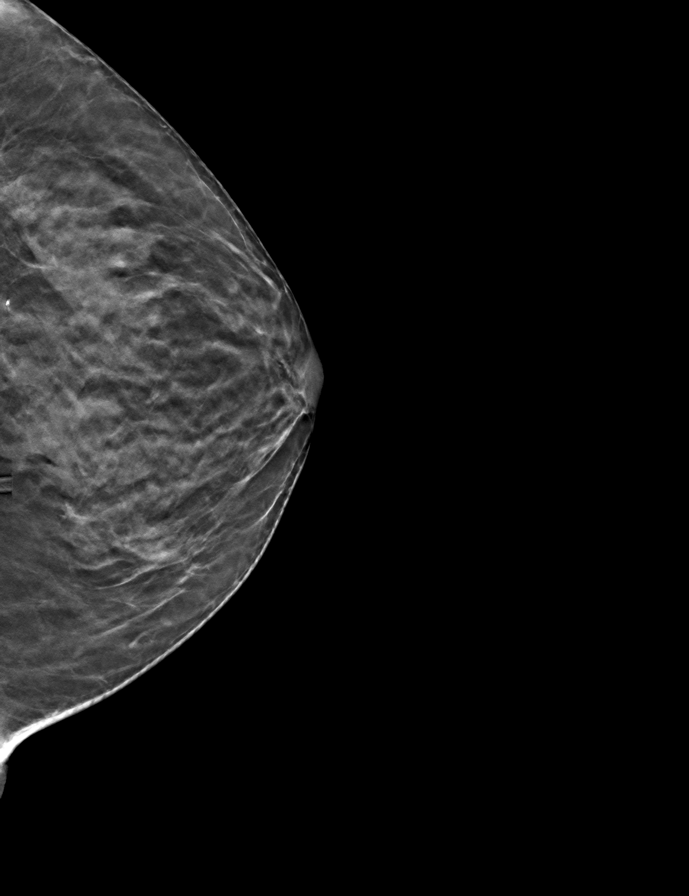

[L MLO tomo · tomo slice 24/47.0]
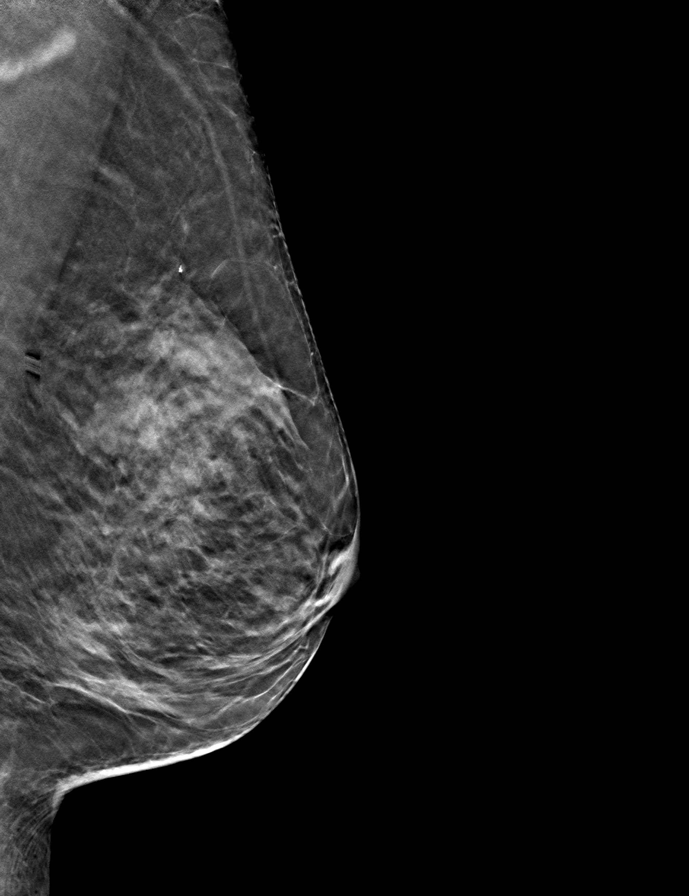

[R MLO tomo · tomo slice 27/52.0]
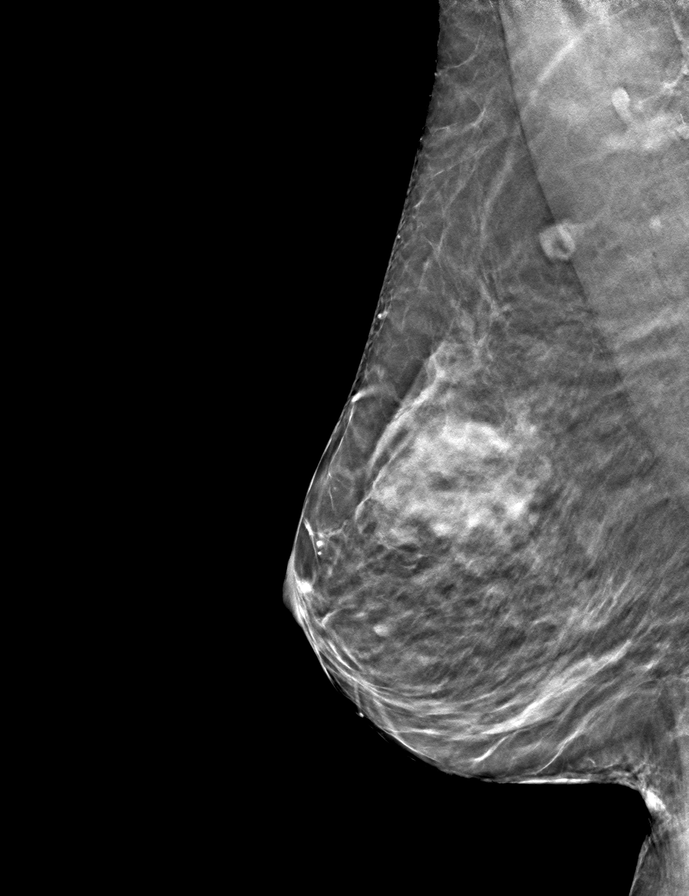

[9 of 24 positions shown; findings below may reference images not displayed]

ACR Breast Density Category c: The breast tissue is heterogeneously
dense, which may obscure small masses.
FINDINGS: There are no findings suspicious for malignancy. Images were
processed with CAD.
IMPRESSION: No mammographic evidence of malignancy. A result letter of this
screening mammogram will be mailed directly to the patient.

RECOMMENDATION:
Screening mammogram in one year. (Code:[id number])

BI-RADS CATEGORY  1: Negative.

## 2020-06-12 DIAGNOSIS — E785 Hyperlipidemia, unspecified: Secondary | ICD-10-CM | POA: Diagnosis not present

## 2020-06-12 DIAGNOSIS — J449 Chronic obstructive pulmonary disease, unspecified: Secondary | ICD-10-CM | POA: Diagnosis not present

## 2020-06-12 DIAGNOSIS — Z0001 Encounter for general adult medical examination with abnormal findings: Secondary | ICD-10-CM | POA: Diagnosis not present

## 2020-06-12 DIAGNOSIS — I10 Essential (primary) hypertension: Secondary | ICD-10-CM | POA: Diagnosis not present

## 2020-06-12 DIAGNOSIS — E1065 Type 1 diabetes mellitus with hyperglycemia: Secondary | ICD-10-CM | POA: Diagnosis not present

## 2020-06-12 DIAGNOSIS — E782 Mixed hyperlipidemia: Secondary | ICD-10-CM | POA: Diagnosis not present

## 2020-06-12 DIAGNOSIS — F411 Generalized anxiety disorder: Secondary | ICD-10-CM | POA: Diagnosis not present

## 2020-06-12 DIAGNOSIS — Z0189 Encounter for other specified special examinations: Secondary | ICD-10-CM | POA: Diagnosis not present

## 2020-06-12 DIAGNOSIS — Z712 Person consulting for explanation of examination or test findings: Secondary | ICD-10-CM | POA: Diagnosis not present

## 2020-06-12 DIAGNOSIS — E1165 Type 2 diabetes mellitus with hyperglycemia: Secondary | ICD-10-CM | POA: Diagnosis not present

## 2020-06-15 DIAGNOSIS — E785 Hyperlipidemia, unspecified: Secondary | ICD-10-CM | POA: Diagnosis not present

## 2020-06-15 DIAGNOSIS — E782 Mixed hyperlipidemia: Secondary | ICD-10-CM | POA: Diagnosis not present

## 2020-06-15 DIAGNOSIS — E1165 Type 2 diabetes mellitus with hyperglycemia: Secondary | ICD-10-CM | POA: Diagnosis not present

## 2020-06-15 DIAGNOSIS — F411 Generalized anxiety disorder: Secondary | ICD-10-CM | POA: Diagnosis not present

## 2020-06-15 DIAGNOSIS — Z23 Encounter for immunization: Secondary | ICD-10-CM | POA: Diagnosis not present

## 2020-06-15 DIAGNOSIS — E1065 Type 1 diabetes mellitus with hyperglycemia: Secondary | ICD-10-CM | POA: Diagnosis not present

## 2020-06-15 DIAGNOSIS — I10 Essential (primary) hypertension: Secondary | ICD-10-CM | POA: Diagnosis not present

## 2020-06-15 DIAGNOSIS — J449 Chronic obstructive pulmonary disease, unspecified: Secondary | ICD-10-CM | POA: Diagnosis not present

## 2020-09-21 DIAGNOSIS — I10 Essential (primary) hypertension: Secondary | ICD-10-CM | POA: Diagnosis not present

## 2020-09-21 DIAGNOSIS — E1165 Type 2 diabetes mellitus with hyperglycemia: Secondary | ICD-10-CM | POA: Diagnosis not present

## 2020-09-21 DIAGNOSIS — E782 Mixed hyperlipidemia: Secondary | ICD-10-CM | POA: Diagnosis not present

## 2020-09-21 DIAGNOSIS — F411 Generalized anxiety disorder: Secondary | ICD-10-CM | POA: Diagnosis not present

## 2020-09-21 DIAGNOSIS — E1065 Type 1 diabetes mellitus with hyperglycemia: Secondary | ICD-10-CM | POA: Diagnosis not present

## 2020-09-21 DIAGNOSIS — Z0189 Encounter for other specified special examinations: Secondary | ICD-10-CM | POA: Diagnosis not present

## 2020-09-21 DIAGNOSIS — Z0001 Encounter for general adult medical examination with abnormal findings: Secondary | ICD-10-CM | POA: Diagnosis not present

## 2020-09-21 DIAGNOSIS — J449 Chronic obstructive pulmonary disease, unspecified: Secondary | ICD-10-CM | POA: Diagnosis not present

## 2020-09-21 DIAGNOSIS — E785 Hyperlipidemia, unspecified: Secondary | ICD-10-CM | POA: Diagnosis not present

## 2020-09-21 DIAGNOSIS — Z712 Person consulting for explanation of examination or test findings: Secondary | ICD-10-CM | POA: Diagnosis not present

## 2020-09-28 DIAGNOSIS — E1165 Type 2 diabetes mellitus with hyperglycemia: Secondary | ICD-10-CM | POA: Diagnosis not present

## 2020-09-28 DIAGNOSIS — J019 Acute sinusitis, unspecified: Secondary | ICD-10-CM | POA: Diagnosis not present

## 2020-09-28 DIAGNOSIS — I10 Essential (primary) hypertension: Secondary | ICD-10-CM | POA: Diagnosis not present

## 2020-09-28 DIAGNOSIS — F411 Generalized anxiety disorder: Secondary | ICD-10-CM | POA: Diagnosis not present

## 2020-09-28 DIAGNOSIS — E785 Hyperlipidemia, unspecified: Secondary | ICD-10-CM | POA: Diagnosis not present

## 2020-09-28 DIAGNOSIS — Z712 Person consulting for explanation of examination or test findings: Secondary | ICD-10-CM | POA: Diagnosis not present

## 2020-09-28 DIAGNOSIS — J449 Chronic obstructive pulmonary disease, unspecified: Secondary | ICD-10-CM | POA: Diagnosis not present

## 2020-09-28 DIAGNOSIS — E782 Mixed hyperlipidemia: Secondary | ICD-10-CM | POA: Diagnosis not present

## 2020-12-28 DIAGNOSIS — Z23 Encounter for immunization: Secondary | ICD-10-CM | POA: Diagnosis not present

## 2021-01-02 DIAGNOSIS — E1165 Type 2 diabetes mellitus with hyperglycemia: Secondary | ICD-10-CM | POA: Diagnosis not present

## 2021-01-02 DIAGNOSIS — I10 Essential (primary) hypertension: Secondary | ICD-10-CM | POA: Diagnosis not present

## 2021-01-04 DIAGNOSIS — R809 Proteinuria, unspecified: Secondary | ICD-10-CM | POA: Diagnosis not present

## 2021-01-04 DIAGNOSIS — J449 Chronic obstructive pulmonary disease, unspecified: Secondary | ICD-10-CM | POA: Diagnosis not present

## 2021-01-04 DIAGNOSIS — E785 Hyperlipidemia, unspecified: Secondary | ICD-10-CM | POA: Diagnosis not present

## 2021-01-04 DIAGNOSIS — E1165 Type 2 diabetes mellitus with hyperglycemia: Secondary | ICD-10-CM | POA: Diagnosis not present

## 2021-01-04 DIAGNOSIS — I10 Essential (primary) hypertension: Secondary | ICD-10-CM | POA: Diagnosis not present

## 2021-01-04 DIAGNOSIS — F419 Anxiety disorder, unspecified: Secondary | ICD-10-CM | POA: Diagnosis not present

## 2021-01-04 DIAGNOSIS — K5190 Ulcerative colitis, unspecified, without complications: Secondary | ICD-10-CM | POA: Diagnosis not present

## 2021-01-04 DIAGNOSIS — Z85528 Personal history of other malignant neoplasm of kidney: Secondary | ICD-10-CM | POA: Diagnosis not present

## 2021-01-17 ENCOUNTER — Other Ambulatory Visit (HOSPITAL_COMMUNITY): Payer: Self-pay | Admitting: Internal Medicine

## 2021-01-17 DIAGNOSIS — Z1231 Encounter for screening mammogram for malignant neoplasm of breast: Secondary | ICD-10-CM

## 2021-02-26 ENCOUNTER — Other Ambulatory Visit: Payer: Self-pay

## 2021-02-26 ENCOUNTER — Ambulatory Visit (HOSPITAL_COMMUNITY)
Admission: RE | Admit: 2021-02-26 | Discharge: 2021-02-26 | Disposition: A | Discharge status: Home / Self Care | Payer: Medicare Other | Source: Ambulatory Visit | Attending: Internal Medicine | Admitting: Internal Medicine

## 2021-02-26 DIAGNOSIS — Z1231 Encounter for screening mammogram for malignant neoplasm of breast: Secondary | ICD-10-CM | POA: Insufficient documentation

## 2021-04-09 ENCOUNTER — Other Ambulatory Visit: Payer: Self-pay | Admitting: Student

## 2021-04-09 NOTE — Telephone Encounter (Signed)
This is a [place] pt.  °

## 2021-04-10 DIAGNOSIS — Z888 Allergy status to other drugs, medicaments and biological substances status: Secondary | ICD-10-CM | POA: Diagnosis not present

## 2021-04-10 DIAGNOSIS — Z8551 Personal history of malignant neoplasm of bladder: Secondary | ICD-10-CM | POA: Diagnosis not present

## 2021-04-10 DIAGNOSIS — C679 Malignant neoplasm of bladder, unspecified: Secondary | ICD-10-CM | POA: Diagnosis not present

## 2021-04-10 DIAGNOSIS — N1339 Other hydronephrosis: Secondary | ICD-10-CM | POA: Diagnosis not present

## 2021-04-10 DIAGNOSIS — Z906 Acquired absence of other parts of urinary tract: Secondary | ICD-10-CM | POA: Diagnosis not present

## 2021-04-10 DIAGNOSIS — N281 Cyst of kidney, acquired: Secondary | ICD-10-CM | POA: Diagnosis not present

## 2021-04-17 ENCOUNTER — Other Ambulatory Visit: Payer: Self-pay | Admitting: Student

## 2021-04-23 ENCOUNTER — Other Ambulatory Visit: Payer: Self-pay | Admitting: Student

## 2021-05-07 DIAGNOSIS — I10 Essential (primary) hypertension: Secondary | ICD-10-CM | POA: Diagnosis not present

## 2021-05-07 DIAGNOSIS — E1165 Type 2 diabetes mellitus with hyperglycemia: Secondary | ICD-10-CM | POA: Diagnosis not present

## 2021-05-08 ENCOUNTER — Other Ambulatory Visit: Payer: Self-pay | Admitting: Student

## 2021-05-08 NOTE — Telephone Encounter (Signed)
This is a  pt.  °

## 2021-05-15 DIAGNOSIS — E1165 Type 2 diabetes mellitus with hyperglycemia: Secondary | ICD-10-CM | POA: Diagnosis not present

## 2021-05-15 DIAGNOSIS — Z0001 Encounter for general adult medical examination with abnormal findings: Secondary | ICD-10-CM | POA: Diagnosis not present

## 2021-05-15 DIAGNOSIS — L500 Allergic urticaria: Secondary | ICD-10-CM | POA: Diagnosis not present

## 2021-05-15 DIAGNOSIS — F419 Anxiety disorder, unspecified: Secondary | ICD-10-CM | POA: Diagnosis not present

## 2021-05-15 DIAGNOSIS — E785 Hyperlipidemia, unspecified: Secondary | ICD-10-CM | POA: Diagnosis not present

## 2021-05-15 DIAGNOSIS — R809 Proteinuria, unspecified: Secondary | ICD-10-CM | POA: Diagnosis not present

## 2021-05-15 DIAGNOSIS — I10 Essential (primary) hypertension: Secondary | ICD-10-CM | POA: Diagnosis not present

## 2021-05-16 ENCOUNTER — Telehealth: Payer: Self-pay | Admitting: Cardiology

## 2021-05-16 ENCOUNTER — Other Ambulatory Visit: Payer: Self-pay | Admitting: Student

## 2021-05-16 MED ORDER — METOPROLOL TARTRATE 50 MG PO TABS: ORAL_TABLET | ORAL | 2 refills | Status: DC

## 2021-05-16 MED ORDER — POTASSIUM CHLORIDE CRYS ER 20 MEQ PO TBCR: EXTENDED_RELEASE_TABLET | ORAL | 2 refills | Status: DC

## 2021-05-16 NOTE — Telephone Encounter (Signed)
Please send refills on pt's medications- she's scheduled to see B. Strader   Please send to [place]

## 2021-05-16 NOTE — Telephone Encounter (Signed)
Complete

## 2021-06-07 ENCOUNTER — Ambulatory Visit (INDEPENDENT_AMBULATORY_CARE_PROVIDER_SITE_OTHER): Payer: Medicare Other | Admitting: Student

## 2021-06-07 ENCOUNTER — Encounter: Payer: Self-pay | Admitting: Student

## 2021-06-07 VITALS — BP 142/66 | HR 72 | Ht 65.0 in | Wt 165.8 lb

## 2021-06-07 DIAGNOSIS — R002 Palpitations: Secondary | ICD-10-CM

## 2021-06-07 DIAGNOSIS — I10 Essential (primary) hypertension: Secondary | ICD-10-CM | POA: Diagnosis not present

## 2021-06-07 DIAGNOSIS — E782 Mixed hyperlipidemia: Secondary | ICD-10-CM | POA: Diagnosis not present

## 2021-06-07 MED ORDER — PRAVASTATIN SODIUM 40 MG PO TABS: 40.0000 mg | ORAL_TABLET | Freq: Every day | ORAL | 11 refills | Status: DC

## 2021-06-07 MED ORDER — CHLORTHALIDONE 25 MG PO TABS: 25.0000 mg | ORAL_TABLET | Freq: Every day | ORAL | 11 refills | Status: DC

## 2021-06-07 MED ORDER — POTASSIUM CHLORIDE CRYS ER 20 MEQ PO TBCR: EXTENDED_RELEASE_TABLET | ORAL | 11 refills | Status: DC

## 2021-06-07 MED ORDER — HYDRALAZINE HCL 100 MG PO TABS: 100.0000 mg | ORAL_TABLET | Freq: Three times a day (TID) | ORAL | 11 refills | Status: DC

## 2021-06-07 MED ORDER — METOPROLOL TARTRATE 50 MG PO TABS: ORAL_TABLET | ORAL | 11 refills | Status: DC

## 2021-06-07 MED ORDER — AMLODIPINE BESYLATE 10 MG PO TABS: 10.0000 mg | ORAL_TABLET | Freq: Every day | ORAL | 11 refills | Status: DC

## 2021-06-07 NOTE — Progress Notes (Addendum)
Cardiology Office Note    Date:  06/07/2021   ID:  April Gomez, DOB [birth year], MRN [id number]  PCP:  Celene Squibb, MD  Cardiologist: Carlyle Dolly, MD    Chief Complaint  Patient presents with   Follow-up    Annual Visit    History of Present Illness:    April Gomez is a 75 y.o. female with past medical history of HTN, HLD, Type 2 DM, IBD and palpitations (PVC's by prior monitor) who presents to the office today for annual follow-up.  She was examined by myself in 04/2020 and was walking for 30 minutes a day without any anginal symptoms and her palpitations had overall been well controlled. She was continued on her current cardiac medications including Lopressor 50 mg twice daily, Amlodipine 10 mg daily, Chlorthalidone 25 mg daily, Hydralazine 100 mg 3 times daily and Pravastatin 40 mg daily.  In talking with the patient today, she reports having a dental extraction last week and has been having pain at the site but this is starting to improve. Mostly still consuming soft foods. From a cardiac perspective, she has been doing well. Denies any recent chest pain or worsening dyspnea on exertion. Uses her Anoro Ellipta daily and Albutrol as needed but has not used in over 2 months. Stays active in working in her yard. No recent orthopnea, PND or pitting edema. No recurrent palpitations since she reduced her caffeine intake.   Past Medical History:  Diagnosis Date   DDD (degenerative disc disease) CERVICAL AND LUMBAR   Diabetes mellitus    Hematuria    History of bladder cancer 12/25/2012   Hyperlipidemia    Hypertension    IBD (inflammatory bowel disease)    Prolapse of vaginal vault after hysterectomy 12/25/2012   PVC's (premature ventricular contractions)    a. Holter 2017: NSR, rare ventricular ectopy; palpitations correlated with NSR.   Urgency of urination     Past Surgical History:  Procedure Laterality Date   ABDOMINAL HYSTERECTOMY     BREAST BIOPSY   03-20-2011  [facility]   BENIGN LEFT BREAST MASS   BUNIONECTOMY  1980'S   CYSTOSCOPY/RETROGRADE/URETEROSCOPY  05/29/2012   Procedure: CYSTOSCOPY/RETROGRADE/URETEROSCOPY;  Surgeon: Claybon Jabs, MD;  Location: [facility];  Service: Urology;  Laterality: Right;   right  Retrograde Pyelogram    HYSTEROSCOPY WITH D & C  04/17/2012   Procedure: DILATATION AND CURETTAGE /HYSTEROSCOPY;  Surgeon: Jonnie Kind, MD;  Location: [facility];  Service: Gynecology;  Laterality: N/A;  POLYPECTOMY   TRANSURETHRAL RESECTION OF BLADDER TUMOR  05/29/2012   Procedure: TRANSURETHRAL RESECTION OF BLADDER TUMOR (TURBT);  Surgeon: Claybon Jabs, MD;  Location: [facility];  Service: Urology;  Laterality: N/A;  [place]   TUBAL LIGATION  1984    Current Medications: Outpatient Medications Prior to Visit  Medication Sig Dispense Refill   albuterol (PROVENTIL HFA;VENTOLIN HFA) 108 (90 Base) MCG/ACT inhaler Inhale 2 puffs into the lungs every 6 (six) hours as needed for wheezing or shortness of breath.     aspirin EC 81 MG tablet Take 81 mg by mouth daily.      cetirizine (ZYRTEC) 10 MG tablet Take 10 mg by mouth daily.     Cholecalciferol (VITAMIN D3) 1.25 MG (50000 UT) CAPS Take by mouth.     LORazepam (ATIVAN) 1 MG tablet Take 1 mg by mouth every 8 (eight) hours as needed for anxiety.     Mesalamine 800 MG TBEC  Take 1 tablet by mouth 2 (two) times daily.     metFORMIN (GLUCOPHAGE-XR) 500 MG 24 hr tablet Take 500 mg by mouth every evening.      Multiple Vitamins-Minerals (CENTRUM SILVER PO) Take 1 tablet by mouth daily.      pantoprazole (PROTONIX) 40 MG tablet Take 40 mg by mouth daily.   11   umeclidinium-vilanterol (ANORO ELLIPTA) 62.5-25 MCG/INH AEPB Inhale 1 puff into the lungs daily.     amLODipine (NORVASC) 10 MG tablet TAKE 1 TABLET BY MOUTH ONCE DAILY WITH BREAKFAST 30 tablet 1   chlorthalidone (HYGROTON) 25 MG tablet TAKE (1) TABLET BY MOUTH AT BEDTIME. 30 tablet 4   hydrALAZINE  (APRESOLINE) 100 MG tablet TAKE ONE TABLET BY MOUTH THREE TIMES DAILY. 90 tablet 0   metoprolol tartrate (LOPRESSOR) 50 MG tablet TAKE (1) TABLET BY MOUTH TWICE DAILY. 60 tablet 2   potassium chloride SA (KLOR-CON) 20 MEQ tablet TAKE 2 TABLETS BY MOUTH EACH MORNING AND 1 TABLET IN THE EVENING. 90 tablet 2   pravastatin (PRAVACHOL) 40 MG tablet TAKE 1 TABLET BY MOUTH ONCE DAILY. 30 tablet 4   Facility-Administered Medications Prior to Visit  Medication Dose Route Frequency Provider Last Rate Last Admin   mitomycin (MUTAMYCIN) chemo injection 40 mg  40 mg Bladder Instillation Once Kathie Rhodes, MD         Allergies:   Lisinopril and Ceftin [cefuroxime axetil]   Social History   Socioeconomic History   Marital status: Married    Spouse name: Not on file   Number of children: Not on file   Years of education: Not on file   Highest education level: Not on file  Occupational History   Not on file  Tobacco Use   Smoking status: Former    Years: 10.00    Types: Cigarettes    Quit date: 05/26/1988    Years since quitting: 33.0   Smokeless tobacco: Never  Vaping Use   Vaping Use: Never used  Substance and Sexual Activity   Alcohol use: No   Drug use: No   Sexual activity: Never  Other Topics Concern   Not on file  Social History Narrative   Not on file   Social Determinants of Health   Financial Resource Strain: Not on file  Food Insecurity: Not on file  Transportation Needs: Not on file  Physical Activity: Not on file  Stress: Not on file  Social Connections: Not on file     Family History:  The patient's family history includes COPD in her father; Cancer in her brother and sister; Diabetes in her brother; Heart attack in her sister; Heart disease in her mother; Heart disease (age of onset: 71) in her sister.   Review of Systems:    Please see the history of present illness.     All other systems reviewed and are otherwise negative except as noted above.   Physical  Exam:    VS:  BP (!) 142/66   Pulse 72   Ht 5\' 5"  (1.651 m)   Wt 165 lb 12.8 oz (75.2 kg)   SpO2 94%   BMI 27.59 kg/m    General: Well developed, well nourished,female appearing in no acute distress. Head: Normocephalic, atraumatic. Neck: No carotid bruits. JVD not elevated.  Lungs: Respirations regular and unlabored, without wheezes or rales.  Heart: Regular rate and rhythm. No S3 or S4.  No murmur, no rubs, or gallops appreciated. Abdomen: Appears non-distended. No obvious abdominal masses. Msk:  Strength and tone appear normal for age. No obvious joint deformities or effusions. Extremities: No clubbing or cyanosis. No pitting edema.  Distal pedal pulses are 2+ bilaterally. Neuro: Alert and oriented X 3. Moves all extremities spontaneously. No focal deficits noted. Psych:  Responds to questions appropriately with a normal affect. Skin: No rashes or lesions noted  Wt Readings from Last 3 Encounters:  06/07/21 165 lb 12.8 oz (75.2 kg)  05/03/20 157 lb (71.2 kg)  04/22/19 162 lb (73.5 kg)     Studies/Labs Reviewed:   EKG:  EKG is ordered today.  The ekg ordered today demonstrates NSR, HR 70 with no acute ST changes when compared to prior tracings.   Recent Labs: No results found for requested labs within last 8760 hours.   Lipid Panel No results found for: CHOL, TRIG, HDL, CHOLHDL, VLDL, LDLCALC, LDLDIRECT  Additional studies/ records that were reviewed today include:   Echocardiogram: 09/2017 Study Conclusions   - Left ventricle: The cavity size was normal. Wall thickness was    increased in a pattern of moderate LVH. Systolic function was    normal. The estimated ejection fraction was in the range of 60%    to 65%. Wall motion was normal; there were no regional wall    motion abnormalities. Doppler parameters are consistent with    abnormal left ventricular relaxation (grade 1 diastolic    dysfunction).  - Aortic valve: Valve area (VTI): 2.23 cm^2. Valve area (Vmax):     1.82 cm^2. Valve area (Vmean): 2 cm^2.  - Left atrium: The atrium was moderately dilated.  - Right atrium: The atrium was mildly dilated.  - Atrial septum: No defect or patent foramen ovale was identified.  - Pulmonary arteries: Systolic pressure was mildly increased. PA    peak pressure: 37 mm Hg (S).  - Technically adequate study.   Assessment:    1. Palpitations   2. Essential hypertension   3. Mixed hyperlipidemia      Plan:   In order of problems listed above:  1. Palpitations - She reports her symptoms have overall been well-controlled and she denies any recent palpitations. She has reduced her caffeine intake which has helped with symptoms as well. Continue Lopressor 50mg  BID.   2. HTN - Her BP was initially elevated at 154/64, rechecked and improved to 142/66. She says her BP has been well-controlled at home with SBP in the 120's to 130's. Continue current regimen for now with Amlodipine 10mg  daily, Chlorthalidone 25mg  daily, Hydralazine 100mg  TID and Lopressor 50mg  BID.   3. HLD - Her LDL was at 57 in 01/2020. She did have repeat labs with her PCP last month and we will request a copy. She remains on Pravastatin 40mg  daily.    Medication Adjustments/Labs and Tests Ordered: Current medicines are reviewed at length with the patient today.  Concerns regarding medicines are outlined above.  Medication changes, Labs and Tests ordered today are listed in the Patient Instructions below. Patient Instructions  Medication Instructions:  Your physician recommends that you continue on your current medications as directed. Please refer to the Current Medication list given to you today.  *If you need a refill on your cardiac medications before your next appointment, please call your pharmacy*   Lab Work: None today  If you have labs (blood work) drawn today and your tests are completely normal, you will receive your results only by: [place] (if you have MyChart)  OR A paper copy in the mail If  you have any lab test that is abnormal or we need to change your treatment, we will call you to review the results.   Testing/Procedures: None today    Follow-Up: At [facility], you and your health needs are our priority.  As part of our continuing mission to provide you with exceptional heart care, we have created designated Provider Care Teams.  These Care Teams include your primary Cardiologist (physician) and Advanced Practice Providers (APPs -  Physician Assistants and Nurse Practitioners) who all work together to provide you with the care you need, when you need it.  We recommend signing up for the patient portal called "MyChart".  Sign up information is provided on this After Visit Summary.  MyChart is used to connect with patients for Virtual Visits (Telemedicine).  Patients are able to view lab/test results, encounter notes, upcoming appointments, etc.  Non-urgent messages can be sent to your provider as well.   To learn more about what you can do with MyChart, go to [email or web address].    Your next appointment:   12 month(s)  The format for your next appointment:   In Person  Provider:   Carlyle Dolly, MD or Bernerd Pho, PA-Cnone     Other Instructions none   Signed, Erma Heritage, PA-C  06/07/2021 7:04 PM    [place]. [address] [place], [place] [address] Phone: [phone] Fax: [phone]

## 2021-06-07 NOTE — Patient Instructions (Signed)
Medication Instructions:  Your physician recommends that you continue on your current medications as directed. Please refer to the Current Medication list given to you today.  *If you need a refill on your cardiac medications before your next appointment, please call your pharmacy*   Lab Work: None today  If you have labs (blood work) drawn today and your tests are completely normal, you will receive your results only by: [place] (if you have MyChart) OR A paper copy in the mail If you have any lab test that is abnormal or we need to change your treatment, we will call you to review the results.   Testing/Procedures: None today    Follow-Up: At [facility], you and your health needs are our priority.  As part of our continuing mission to provide you with exceptional heart care, we have created designated Provider Care Teams.  These Care Teams include your primary Cardiologist (physician) and Advanced Practice Providers (APPs -  Physician Assistants and Nurse Practitioners) who all work together to provide you with the care you need, when you need it.  We recommend signing up for the patient portal called "MyChart".  Sign up information is provided on this After Visit Summary.  MyChart is used to connect with patients for Virtual Visits (Telemedicine).  Patients are able to view lab/test results, encounter notes, upcoming appointments, etc.  Non-urgent messages can be sent to your provider as well.   To learn more about what you can do with MyChart, go to [email or web address].    Your next appointment:   12 month(s)  The format for your next appointment:   In Person  Provider:   Carlyle Dolly, MD or Bernerd Pho, PA-Cnone     Other Instructions none

## 2021-07-05 DIAGNOSIS — Z23 Encounter for immunization: Secondary | ICD-10-CM | POA: Diagnosis not present

## 2021-08-15 DIAGNOSIS — L638 Other alopecia areata: Secondary | ICD-10-CM | POA: Diagnosis not present

## 2021-08-15 DIAGNOSIS — L80 Vitiligo: Secondary | ICD-10-CM | POA: Diagnosis not present

## 2021-09-13 DIAGNOSIS — I10 Essential (primary) hypertension: Secondary | ICD-10-CM | POA: Diagnosis not present

## 2021-09-13 DIAGNOSIS — E1165 Type 2 diabetes mellitus with hyperglycemia: Secondary | ICD-10-CM | POA: Diagnosis not present

## 2021-09-18 DIAGNOSIS — F419 Anxiety disorder, unspecified: Secondary | ICD-10-CM | POA: Diagnosis not present

## 2021-09-18 DIAGNOSIS — I10 Essential (primary) hypertension: Secondary | ICD-10-CM | POA: Diagnosis not present

## 2021-09-18 DIAGNOSIS — E1165 Type 2 diabetes mellitus with hyperglycemia: Secondary | ICD-10-CM | POA: Diagnosis not present

## 2021-09-18 DIAGNOSIS — J309 Allergic rhinitis, unspecified: Secondary | ICD-10-CM | POA: Diagnosis not present

## 2021-09-18 DIAGNOSIS — R809 Proteinuria, unspecified: Secondary | ICD-10-CM | POA: Diagnosis not present

## 2021-09-18 DIAGNOSIS — E785 Hyperlipidemia, unspecified: Secondary | ICD-10-CM | POA: Diagnosis not present

## 2022-01-10 DIAGNOSIS — I10 Essential (primary) hypertension: Secondary | ICD-10-CM | POA: Diagnosis not present

## 2022-01-10 DIAGNOSIS — E1165 Type 2 diabetes mellitus with hyperglycemia: Secondary | ICD-10-CM | POA: Diagnosis not present

## 2022-01-10 DIAGNOSIS — E785 Hyperlipidemia, unspecified: Secondary | ICD-10-CM | POA: Diagnosis not present

## 2022-01-17 DIAGNOSIS — I10 Essential (primary) hypertension: Secondary | ICD-10-CM | POA: Diagnosis not present

## 2022-01-17 DIAGNOSIS — F419 Anxiety disorder, unspecified: Secondary | ICD-10-CM | POA: Diagnosis not present

## 2022-01-17 DIAGNOSIS — E1165 Type 2 diabetes mellitus with hyperglycemia: Secondary | ICD-10-CM | POA: Diagnosis not present

## 2022-01-17 DIAGNOSIS — R809 Proteinuria, unspecified: Secondary | ICD-10-CM | POA: Diagnosis not present

## 2022-01-17 DIAGNOSIS — E785 Hyperlipidemia, unspecified: Secondary | ICD-10-CM | POA: Diagnosis not present

## 2022-01-17 DIAGNOSIS — M8580 Other specified disorders of bone density and structure, unspecified site: Secondary | ICD-10-CM | POA: Diagnosis not present

## 2022-01-17 DIAGNOSIS — Z6828 Body mass index (BMI) 28.0-28.9, adult: Secondary | ICD-10-CM | POA: Diagnosis not present

## 2022-01-17 DIAGNOSIS — Z0000 Encounter for general adult medical examination without abnormal findings: Secondary | ICD-10-CM | POA: Diagnosis not present

## 2022-01-17 DIAGNOSIS — J309 Allergic rhinitis, unspecified: Secondary | ICD-10-CM | POA: Diagnosis not present

## 2022-01-17 DIAGNOSIS — E663 Overweight: Secondary | ICD-10-CM | POA: Diagnosis not present

## 2022-01-17 DIAGNOSIS — J019 Acute sinusitis, unspecified: Secondary | ICD-10-CM | POA: Diagnosis not present

## 2022-02-04 ENCOUNTER — Other Ambulatory Visit (HOSPITAL_COMMUNITY): Payer: Self-pay | Admitting: Internal Medicine

## 2022-02-04 DIAGNOSIS — Z1231 Encounter for screening mammogram for malignant neoplasm of breast: Secondary | ICD-10-CM

## 2022-02-28 ENCOUNTER — Ambulatory Visit (HOSPITAL_COMMUNITY)
Admission: RE | Admit: 2022-02-28 | Discharge: 2022-02-28 | Disposition: A | Discharge status: Home / Self Care | Payer: Medicare Other | Source: Ambulatory Visit | Attending: Internal Medicine | Admitting: Internal Medicine

## 2022-02-28 DIAGNOSIS — Z1231 Encounter for screening mammogram for malignant neoplasm of breast: Secondary | ICD-10-CM | POA: Insufficient documentation

## 2022-04-18 DIAGNOSIS — Z906 Acquired absence of other parts of urinary tract: Secondary | ICD-10-CM | POA: Diagnosis not present

## 2022-04-18 DIAGNOSIS — N1330 Unspecified hydronephrosis: Secondary | ICD-10-CM | POA: Diagnosis not present

## 2022-04-18 DIAGNOSIS — R93421 Abnormal radiologic findings on diagnostic imaging of right kidney: Secondary | ICD-10-CM | POA: Diagnosis not present

## 2022-04-18 DIAGNOSIS — R319 Hematuria, unspecified: Secondary | ICD-10-CM | POA: Diagnosis not present

## 2022-04-18 DIAGNOSIS — N1339 Other hydronephrosis: Secondary | ICD-10-CM | POA: Diagnosis not present

## 2022-04-18 DIAGNOSIS — N281 Cyst of kidney, acquired: Secondary | ICD-10-CM | POA: Diagnosis not present

## 2022-04-18 DIAGNOSIS — C679 Malignant neoplasm of bladder, unspecified: Secondary | ICD-10-CM | POA: Diagnosis not present

## 2022-05-08 ENCOUNTER — Other Ambulatory Visit: Payer: Self-pay | Admitting: Student

## 2022-05-15 ENCOUNTER — Other Ambulatory Visit: Payer: Self-pay | Admitting: Student

## 2022-05-16 DIAGNOSIS — E559 Vitamin D deficiency, unspecified: Secondary | ICD-10-CM | POA: Diagnosis not present

## 2022-05-16 DIAGNOSIS — I10 Essential (primary) hypertension: Secondary | ICD-10-CM | POA: Diagnosis not present

## 2022-05-16 DIAGNOSIS — M8580 Other specified disorders of bone density and structure, unspecified site: Secondary | ICD-10-CM | POA: Diagnosis not present

## 2022-05-16 DIAGNOSIS — E1165 Type 2 diabetes mellitus with hyperglycemia: Secondary | ICD-10-CM | POA: Diagnosis not present

## 2022-05-21 DIAGNOSIS — M8580 Other specified disorders of bone density and structure, unspecified site: Secondary | ICD-10-CM | POA: Diagnosis not present

## 2022-05-21 DIAGNOSIS — Z23 Encounter for immunization: Secondary | ICD-10-CM | POA: Diagnosis not present

## 2022-05-21 DIAGNOSIS — J449 Chronic obstructive pulmonary disease, unspecified: Secondary | ICD-10-CM | POA: Diagnosis not present

## 2022-05-21 DIAGNOSIS — E663 Overweight: Secondary | ICD-10-CM | POA: Diagnosis not present

## 2022-05-21 DIAGNOSIS — K589 Irritable bowel syndrome without diarrhea: Secondary | ICD-10-CM | POA: Diagnosis not present

## 2022-05-21 DIAGNOSIS — F419 Anxiety disorder, unspecified: Secondary | ICD-10-CM | POA: Diagnosis not present

## 2022-05-21 DIAGNOSIS — E785 Hyperlipidemia, unspecified: Secondary | ICD-10-CM | POA: Diagnosis not present

## 2022-05-21 DIAGNOSIS — E1165 Type 2 diabetes mellitus with hyperglycemia: Secondary | ICD-10-CM | POA: Diagnosis not present

## 2022-05-21 DIAGNOSIS — I10 Essential (primary) hypertension: Secondary | ICD-10-CM | POA: Diagnosis not present

## 2022-05-21 DIAGNOSIS — J309 Allergic rhinitis, unspecified: Secondary | ICD-10-CM | POA: Diagnosis not present

## 2022-05-21 DIAGNOSIS — R809 Proteinuria, unspecified: Secondary | ICD-10-CM | POA: Diagnosis not present

## 2022-06-05 ENCOUNTER — Other Ambulatory Visit: Payer: Self-pay | Admitting: Student

## 2022-06-17 ENCOUNTER — Other Ambulatory Visit: Payer: Self-pay | Admitting: Student

## 2022-06-18 ENCOUNTER — Encounter: Payer: Self-pay | Admitting: Student

## 2022-06-18 ENCOUNTER — Ambulatory Visit: Payer: Medicare Other | Attending: Cardiology | Admitting: Student

## 2022-06-18 VITALS — BP 130/68 | HR 69 | Ht 66.0 in | Wt 162.4 lb

## 2022-06-18 DIAGNOSIS — E782 Mixed hyperlipidemia: Secondary | ICD-10-CM | POA: Insufficient documentation

## 2022-06-18 DIAGNOSIS — I10 Essential (primary) hypertension: Secondary | ICD-10-CM | POA: Insufficient documentation

## 2022-06-18 DIAGNOSIS — R002 Palpitations: Secondary | ICD-10-CM | POA: Insufficient documentation

## 2022-06-18 MED ORDER — CHLORTHALIDONE 25 MG PO TABS: ORAL_TABLET | ORAL | 11 refills | Status: DC

## 2022-06-18 MED ORDER — PRAVASTATIN SODIUM 40 MG PO TABS: 40.0000 mg | ORAL_TABLET | Freq: Every day | ORAL | 11 refills | Status: DC

## 2022-06-18 MED ORDER — HYDRALAZINE HCL 100 MG PO TABS: 100.0000 mg | ORAL_TABLET | Freq: Three times a day (TID) | ORAL | 11 refills | Status: DC

## 2022-06-18 MED ORDER — METOPROLOL TARTRATE 50 MG PO TABS: 50.0000 mg | ORAL_TABLET | Freq: Two times a day (BID) | ORAL | 11 refills | Status: DC

## 2022-06-18 MED ORDER — POTASSIUM CHLORIDE CRYS ER 20 MEQ PO TBCR: EXTENDED_RELEASE_TABLET | ORAL | 11 refills | Status: DC

## 2022-06-18 MED ORDER — AMLODIPINE BESYLATE 10 MG PO TABS: 10.0000 mg | ORAL_TABLET | Freq: Every day | ORAL | 11 refills | Status: DC

## 2022-06-18 NOTE — Progress Notes (Signed)
Cardiology Office Note    Date:  06/18/2022   ID:  April Gomez, DOB March 22, 1946, MRN [id number]  PCP:  Celene Squibb, MD  Cardiologist: Carlyle Dolly, MD    Chief Complaint  Patient presents with   Follow-up    Annual Visit    History of Present Illness:    April Gomez is a 76 y.o. female with past medical history of HTN, HLD, Type II DM, IBD and palpitations who presents to the office today for annual follow-up.  She was examined by myself in 05/2021 and denied any recent anginal symptoms at that time. Reported her palpitations had improved with reduction of caffeine intake. She was continued on her current cardiac medications including Lopressor 50 mg twice daily, Amlodipine 10 mg daily, Chlorthalidone 25 mg daily, Hydralazine 100 mg 3 times daily and Pravastatin 40 mg daily.  In talking with the patient today, she reports overall doing well since her last office visit. During the warmer temperatures, she enjoys planting flowers in her yard but remains active during the winter months as well. Denies any recent chest pain or dyspnea on exertion with routine activities. No recent orthopnea, PND or pitting edema. Reports her palpitations did significantly improve with prior reduction of her caffeine intake.  Past Medical History:  Diagnosis Date   DDD (degenerative disc disease) CERVICAL AND LUMBAR   Diabetes mellitus    Hematuria    History of bladder cancer 12/25/2012   Hyperlipidemia    Hypertension    IBD (inflammatory bowel disease)    Prolapse of vaginal vault after hysterectomy 12/25/2012   PVC's (premature ventricular contractions)    a. Holter 2017: NSR, rare ventricular ectopy; palpitations correlated with NSR.   Urgency of urination     Past Surgical History:  Procedure Laterality Date   ABDOMINAL HYSTERECTOMY     BREAST BIOPSY  03-20-2011  [facility]   BENIGN LEFT BREAST MASS   BUNIONECTOMY  1980'S   CYSTOSCOPY/RETROGRADE/URETEROSCOPY  05/29/2012    Procedure: CYSTOSCOPY/RETROGRADE/URETEROSCOPY;  Surgeon: Claybon Jabs, MD;  [facility];  Service: Urology;  Laterality: Right;   right  Retrograde Pyelogram    HYSTEROSCOPY WITH D & C  04/17/2012   Procedure: DILATATION AND CURETTAGE /HYSTEROSCOPY;  Surgeon: Jonnie Kind, MD;  Location: [facility];  Service: Gynecology;  Laterality: N/A;  POLYPECTOMY   TRANSURETHRAL RESECTION OF BLADDER TUMOR  05/29/2012   Procedure: TRANSURETHRAL RESECTION OF BLADDER TUMOR (TURBT);  Surgeon: Claybon Jabs, MD;  [facility];  Service: Urology;  Laterality: N/A;  [place]   TUBAL LIGATION  1984    Current Medications: Outpatient Medications Prior to Visit  Medication Sig Dispense Refill   albuterol (PROVENTIL HFA;VENTOLIN HFA) 108 (90 Base) MCG/ACT inhaler Inhale 2 puffs into the lungs every 6 (six) hours as needed for wheezing or shortness of breath.     aspirin EC 81 MG tablet Take 81 mg by mouth daily.      cetirizine (ZYRTEC) 10 MG tablet Take 10 mg by mouth daily.     Cholecalciferol (VITAMIN D3) 1.25 MG (50000 UT) CAPS Take by mouth.     LORazepam (ATIVAN) 1 MG tablet Take 1 mg by mouth every 8 (eight) hours as needed for anxiety.     Mesalamine 800 MG TBEC Take 1 tablet by mouth 2 (two) times daily.     metFORMIN (GLUCOPHAGE-XR) 500 MG 24 hr tablet Take 500 mg by mouth every evening.  Multiple Vitamins-Minerals (CENTRUM SILVER PO) Take 1 tablet by mouth daily.      pantoprazole (PROTONIX) 40 MG tablet Take 40 mg by mouth daily.   11   umeclidinium-vilanterol (ANORO ELLIPTA) 62.5-25 MCG/INH AEPB Inhale 1 puff into the lungs daily.     amLODipine (NORVASC) 10 MG tablet TAKE 1 TABLET BY MOUTH ONCE DAILY WITH BREAKFAST 30 tablet 1   chlorthalidone (HYGROTON) 25 MG tablet TAKE (1) TABLET BY MOUTH AT BEDTIME. 90 tablet 3   hydrALAZINE (APRESOLINE) 100 MG tablet TAKE ONE TABLET BY MOUTH THREE TIMES DAILY. 270 tablet 1   metoprolol tartrate (LOPRESSOR) 50 MG  tablet TAKE (1) TABLET BY MOUTH TWICE DAILY. 60 tablet 0   potassium chloride SA (KLOR-CON M) 20 MEQ tablet TAKE 2 TABLETS BY MOUTH EACH MORNING AND 1 TABLET IN THE EVENING. 90 tablet 1   pravastatin (PRAVACHOL) 40 MG tablet TAKE 1 TABLET BY MOUTH ONCE DAILY. 90 tablet 1   Facility-Administered Medications Prior to Visit  Medication Dose Route Frequency Provider Last Rate Last Admin   mitomycin (MUTAMYCIN) chemo injection 40 mg  40 mg Bladder Instillation Once Kathie Rhodes, MD         Allergies:   Lisinopril and Ceftin [cefuroxime axetil]   Social History   Socioeconomic History   Marital status: Married    Spouse name: Not on file   Number of children: Not on file   Years of education: Not on file   Highest education level: Not on file  Occupational History   Not on file  Tobacco Use   Smoking status: Former    Years: 10.00    Types: Cigarettes    Quit date: 05/26/1988    Years since quitting: 34.0   Smokeless tobacco: Never  Vaping Use   Vaping Use: Never used  Substance and Sexual Activity   Alcohol use: No   Drug use: No   Sexual activity: Never  Other Topics Concern   Not on file  Social History Narrative   Not on file   Social Determinants of Health   Financial Resource Strain: Not on file  Food Insecurity: Not on file  Transportation Needs: Not on file  Physical Activity: Not on file  Stress: Not on file  Social Connections: Not on file     Family History:  The patient's family history includes COPD in her father; Cancer in her brother and sister; Diabetes in her brother; Heart attack in her sister; Heart disease in her mother; Heart disease (age of onset: 18) in her sister.   Review of Systems:    Please see the history of present illness.     All other systems reviewed and are otherwise negative except as noted above.   Physical Exam:    VS:  BP 130/68   Pulse 69   Ht '5\' 6"'$  (1.676 m)   Wt 162 lb 6.4 oz (73.7 kg)   SpO2 96%   BMI 26.21 kg/m     General: Pleasant female appearing in no acute distress. Head: Normocephalic, atraumatic. Neck: No carotid bruits. JVD not elevated.  Lungs: Respirations regular and unlabored, without wheezes or rales.  Heart: Regular rate and rhythm. No S3 or S4.  No murmur, no rubs, or gallops appreciated. Abdomen: Appears non-distended. No obvious abdominal masses. Msk:  Strength and tone appear normal for age. No obvious joint deformities or effusions. Extremities: No clubbing or cyanosis. No pitting edema.  Distal pedal pulses are 2+ bilaterally. Neuro: Alert and oriented X  3. Moves all extremities spontaneously. No focal deficits noted. Psych:  Responds to questions appropriately with a normal affect. Skin: No rashes or lesions noted  Wt Readings from Last 3 Encounters:  06/18/22 162 lb 6.4 oz (73.7 kg)  06/07/21 165 lb 12.8 oz (75.2 kg)  05/03/20 157 lb (71.2 kg)     Studies/Labs Reviewed:   EKG:  EKG is ordered today.  The ekg ordered today demonstrates NSR, HR 69 with TWI along anterior leads which is overall similar to prior tracings.   Recent Labs: No results found for requested labs within last 365 days.   Lipid Panel No results found for: "CHOL", "TRIG", "HDL", "CHOLHDL", "VLDL", "LDLCALC", "LDLDIRECT"  Additional studies/ records that were reviewed today include:   Holter Monitor: 09/2017 Min HR 63, Max HR 101, Avg HR 77. Primary rhythm is sinus No supraventricular ectopy Rare ventricular ectopy, all in the form of isolated PVCs and couplets Reported symptoms correlate with sinus rhythm with occasional PVCs No significant sustained arrhythmias  Echocardiogram: 09/2017 Study Conclusions   - Left ventricle: The cavity size was normal. Wall thickness was    increased in a pattern of moderate LVH. Systolic function was    normal. The estimated ejection fraction was in the range of 60%    to 65%. Wall motion was normal; there were no regional wall    motion abnormalities.  Doppler parameters are consistent with    abnormal left ventricular relaxation (grade 1 diastolic    dysfunction).  - Aortic valve: Valve area (VTI): 2.23 cm^2. Valve area (Vmax):    1.82 cm^2. Valve area (Vmean): 2 cm^2.  - Left atrium: The atrium was moderately dilated.  - Right atrium: The atrium was mildly dilated.  - Atrial septum: No defect or patent foramen ovale was identified.  - Pulmonary arteries: Systolic pressure was mildly increased. PA    peak pressure: 37 mm Hg (S).  - Technically adequate study.   Assessment:    1. Essential hypertension   2. Palpitations   3. Mixed hyperlipidemia      Plan:   In order of problems listed above:  1. HTN - Her blood pressure is well-controlled at 130/68 during today's visit and she reports this has also been well-controlled when checked at home. Continue current medical therapy with Amlodipine 10 mg daily, Chlorthalidone 25 mg daily, Hydralazine 100 mg TID and Lopressor 50 mg twice daily. Her creatinine was stable at 0.88 by recent labs in 04/2022 with K+ at 3.5.  2.  Palpitations - She reports her symptoms significantly improved with reducing caffeine intake. No recent symptoms. Continue Lopressor 50 mg twice daily.  3. HLD - Followed by her PCP. Recent FLP last month showed total cholesterol 121, triglycerides 114, HDL 43 and LDL 53. Continue current medical therapy with Pravastatin 40 mg daily.   Medication Adjustments/Labs and Tests Ordered: Current medicines are reviewed at length with the patient today.  Concerns regarding medicines are outlined above.  Medication changes, Labs and Tests ordered today are listed in the Patient Instructions below. Patient Instructions  Medication Instructions:  Your physician recommends that you continue on your current medications as directed. Please refer to the Current Medication list given to you today.   Labwork: None  Testing/Procedures: None  Follow-Up: Follow up with Dr.  Harl Bowie or Bernerd Pho, PA-C in 1 year   Any Other Special Instructions Will Be Listed Below (If Applicable).     If you need a refill on your cardiac medications  before your next appointment, please call your pharmacy.    Signed, Erma Heritage, PA-C  06/18/2022 4:49 PM    [place] [address] [place], [place] [address] Phone: [phone] Fax: [phone]

## 2022-06-18 NOTE — Patient Instructions (Signed)
Medication Instructions:  Your physician recommends that you continue on your current medications as directed. Please refer to the Current Medication list given to you today.   Labwork: None  Testing/Procedures: None  Follow-Up: Follow up with Dr. Harl Bowie or Bernerd Pho, PA-C in 1 year   Any Other Special Instructions Will Be Listed Below (If Applicable).     If you need a refill on your cardiac medications before your next appointment, please call your pharmacy.

## 2022-06-25 ENCOUNTER — Ambulatory Visit: Payer: Medicare Other | Admitting: Cardiology

## 2022-08-20 ENCOUNTER — Encounter (HOSPITAL_COMMUNITY): Payer: Self-pay

## 2022-08-20 ENCOUNTER — Emergency Department (HOSPITAL_COMMUNITY)
Admission: EM | Admit: 2022-08-20 | Discharge: 2022-08-20 | Disposition: A | Discharge status: Home / Self Care | Payer: Medicare Other | Attending: Emergency Medicine | Admitting: Emergency Medicine

## 2022-08-20 ENCOUNTER — Other Ambulatory Visit: Payer: Self-pay

## 2022-08-20 DIAGNOSIS — Z8551 Personal history of malignant neoplasm of bladder: Secondary | ICD-10-CM | POA: Diagnosis not present

## 2022-08-20 DIAGNOSIS — Z7982 Long term (current) use of aspirin: Secondary | ICD-10-CM | POA: Diagnosis not present

## 2022-08-20 DIAGNOSIS — Z7901 Long term (current) use of anticoagulants: Secondary | ICD-10-CM | POA: Insufficient documentation

## 2022-08-20 DIAGNOSIS — E119 Type 2 diabetes mellitus without complications: Secondary | ICD-10-CM | POA: Diagnosis not present

## 2022-08-20 DIAGNOSIS — I10 Essential (primary) hypertension: Secondary | ICD-10-CM | POA: Insufficient documentation

## 2022-08-20 DIAGNOSIS — E876 Hypokalemia: Secondary | ICD-10-CM | POA: Insufficient documentation

## 2022-08-20 DIAGNOSIS — Z7984 Long term (current) use of oral hypoglycemic drugs: Secondary | ICD-10-CM | POA: Diagnosis not present

## 2022-08-20 DIAGNOSIS — R040 Epistaxis: Secondary | ICD-10-CM | POA: Insufficient documentation

## 2022-08-20 LAB — CBC WITH DIFFERENTIAL/PLATELET
Abs Immature Granulocytes: 0.01 10*3/uL (ref 0.00–0.07)
Basophils Absolute: 0.1 10*3/uL (ref 0.0–0.1)
Basophils Relative: 1 %
Eosinophils Absolute: 0 10*3/uL (ref 0.0–0.5)
Eosinophils Relative: 1 %
HCT: 39 % (ref 36.0–46.0)
Hemoglobin: 12.4 g/dL (ref 12.0–15.0)
Immature Granulocytes: 0 %
Lymphocytes Relative: 26 %
Lymphs Abs: 1.7 10*3/uL (ref 0.7–4.0)
MCH: 29.2 pg (ref 26.0–34.0)
MCHC: 31.8 g/dL (ref 30.0–36.0)
MCV: 91.8 fL (ref 80.0–100.0)
Monocytes Absolute: 0.6 10*3/uL (ref 0.1–1.0)
Monocytes Relative: 9 %
Neutro Abs: 4.2 10*3/uL (ref 1.7–7.7)
Neutrophils Relative %: 63 %
Platelets: 192 10*3/uL (ref 150–400)
RBC: 4.25 MIL/uL (ref 3.87–5.11)
RDW: 13.9 % (ref 11.5–15.5)
WBC: 6.6 10*3/uL (ref 4.0–10.5)
nRBC: 0 % (ref 0.0–0.2)

## 2022-08-20 LAB — BASIC METABOLIC PANEL
Anion gap: 9 (ref 5–15)
BUN: 15 mg/dL (ref 8–23)
CO2: 27 mmol/L (ref 22–32)
Calcium: 9.3 mg/dL (ref 8.9–10.3)
Chloride: 101 mmol/L (ref 98–111)
Creatinine, Ser: 0.72 mg/dL (ref 0.44–1.00)
GFR, Estimated: >60 mL/min (ref >60)
Glucose, Bld: 111 mg/dL — ABNORMAL HIGH (ref 70–99)
Potassium: 2.9 mmol/L — ABNORMAL LOW (ref 3.5–5.1)
Sodium: 137 mmol/L (ref 135–145)

## 2022-08-20 MED ORDER — OXYMETAZOLINE HCL 0.05 % NA SOLN
2.0000 | Freq: Once | NASAL | Status: AC
  Administered 2022-08-20: 2 via NASAL
  Filled 2022-08-20: qty 30

## 2022-08-20 MED ORDER — SILVER NITRATE-POT NITRATE 75-25 % EX MISC
1.0000 | Freq: Once | CUTANEOUS | Status: AC
  Administered 2022-08-20: 1 via TOPICAL

## 2022-08-20 MED ORDER — POTASSIUM CHLORIDE CRYS ER 20 MEQ PO TBCR
40.0000 meq | EXTENDED_RELEASE_TABLET | Freq: Once | ORAL | Status: AC
  Administered 2022-08-20: 40 meq via ORAL
  Filled 2022-08-20: qty 2

## 2022-08-20 NOTE — Discharge Instructions (Addendum)
An area near the right nostril has been cauterized.  Avoid bending over or heavy lifting, and blowing her nose.  If you sneeze, hold pressure to your right nostril while sneezing.  Also, your potassium level this evening is low at 2.9.  The low end of normal is 3.5.  You have been given 40 mill equivalents of potassium this evening.  Starting tomorrow, take 40 mill equivalents of potassium twice a day for 3 days.  Please contact Dr. Juel Burrow office to arrange a follow-up appointment to have your potassium rechecked at the end of the week.  Return to the emergency department for any new or worsening symptoms.  You have also been given follow-up information for the ear nose and throat doctor

## 2022-08-20 NOTE — ED Triage Notes (Signed)
Pt with a nosebleed from the right nare, bleeding is currently stopped but has bled intermittently for the past few days.  Reports that nare was cauterized 3 years ago.

## 2022-08-22 DIAGNOSIS — E876 Hypokalemia: Secondary | ICD-10-CM | POA: Diagnosis not present

## 2022-08-22 DIAGNOSIS — R040 Epistaxis: Secondary | ICD-10-CM | POA: Diagnosis not present

## 2022-08-22 NOTE — ED Provider Notes (Signed)
[facility] Provider Note   CSN: [id number] Arrival date & time: 08/20/22  1329     History  Chief Complaint  Patient presents with   Epistaxis    April Gomez is a 77 y.o. female.   Epistaxis Associated symptoms: no congestion, no dizziness and no headaches        April Gomez is a 77 y.o. female with past medical history of type 2 diabetes, hypertension and history of bladder cancer who presents to the Emergency Department complaining of intermittent right-sided nosebleeds.  She endorses intermittent episodes of bleeding for the last several days.  Bleeding has been controlled with nasal packing and direct pressure.  She takes 81 mg aspirin, but denies any anticoagulants, syncope, dizziness and headache.  States she had her right nose cauterized 3 years ago after having similar symptoms.  She denies any recent changes to her blood pressure medications.  Home Medications Prior to Admission medications   Medication Sig Start Date End Date Taking? Authorizing Provider  albuterol (PROVENTIL HFA;VENTOLIN HFA) 108 (90 Base) MCG/ACT inhaler Inhale 2 puffs into the lungs every 6 (six) hours as needed for wheezing or shortness of breath.    [provider]  amLODipine (NORVASC) 10 MG tablet Take 1 tablet (10 mg total) by mouth daily with breakfast. 06/18/22   Strader, Fransisco Hertz, PA-C  aspirin EC 81 MG tablet Take 81 mg by mouth daily.     [provider]  cetirizine (ZYRTEC) 10 MG tablet Take 10 mg by mouth daily.    [provider]  chlorthalidone (HYGROTON) 25 MG tablet TAKE (1) TABLET BY MOUTH AT BEDTIME. 06/18/22   Strader, [place] M, PA-C  Cholecalciferol (VITAMIN D3) 1.25 MG (50000 UT) CAPS Take by mouth.    [provider]  hydrALAZINE (APRESOLINE) 100 MG tablet Take 1 tablet (100 mg total) by mouth 3 (three) times daily. 06/18/22   Strader, Fransisco Hertz, PA-C  LORazepam (ATIVAN) 1 MG tablet  Take 1 mg by mouth every 8 (eight) hours as needed for anxiety.    [provider]  Mesalamine 800 MG TBEC Take 1 tablet by mouth 2 (two) times daily.    [provider]  metFORMIN (GLUCOPHAGE-XR) 500 MG 24 hr tablet Take 500 mg by mouth every evening.     [provider]  metoprolol tartrate (LOPRESSOR) 50 MG tablet Take 1 tablet (50 mg total) by mouth 2 (two) times daily. 06/18/22   Strader, Fransisco Hertz, PA-C  Multiple Vitamins-Minerals (CENTRUM SILVER PO) Take 1 tablet by mouth daily.     [provider]  pantoprazole (PROTONIX) 40 MG tablet Take 40 mg by mouth daily.  02/23/18   [provider]  potassium chloride SA (KLOR-CON M) 20 MEQ tablet Take 2 tablets in AM and 1 tablet in PM 06/18/22   Strader, [place] M, PA-C  pravastatin (PRAVACHOL) 40 MG tablet Take 1 tablet (40 mg total) by mouth daily. 06/18/22   Strader, Fransisco Hertz, PA-C  umeclidinium-vilanterol (ANORO ELLIPTA) 62.5-25 MCG/INH AEPB Inhale 1 puff into the lungs daily.    [provider]      Allergies    Lisinopril and Ceftin [cefuroxime axetil]    Review of Systems   Review of Systems  Constitutional:  Negative for fatigue.  HENT:  Positive for nosebleeds. Negative for congestion, facial swelling and trouble swallowing.   Eyes:  Negative for visual disturbance.  Musculoskeletal:  Negative for neck pain and neck  stiffness.  Neurological:  Negative for dizziness, syncope, weakness and headaches.  Hematological:  Does not bruise/bleed easily.    Physical Exam Updated Vital Signs BP (!) 182/74   Pulse 80   Temp 98 F (36.7 C)   Resp 17   Ht '5\' 5"'$  (1.651 m)   Wt 72.6 kg   SpO2 98%   BMI 26.63 kg/m  Physical Exam Vitals and nursing note reviewed.  Constitutional:      General: She is not in acute distress.    Appearance: Normal appearance. She is not ill-appearing or toxic-appearing.  HENT:     Nose:     Right Nostril: Epistaxis present. No foreign body,  septal hematoma or occlusion.     Left Nostril: No foreign body, epistaxis, septal hematoma or occlusion.     Comments: Small amount of bright red blood in the right nare.  No clots seen.  Small area of excoriation noted to the lateral mucosa of the nose.    Mouth/Throat:     Mouth: Mucous membranes are moist.     Comments: Small amount of bright red blood seen to the posterior oropharynx. Eyes:     Conjunctiva/sclera: Conjunctivae normal.     Pupils: Pupils are equal, round, and reactive to light.  Cardiovascular:     Rate and Rhythm: Normal rate and regular rhythm.     Pulses: Normal pulses.  Pulmonary:     Effort: Pulmonary effort is normal.  Musculoskeletal:        General: Normal range of motion.     Cervical back: Normal range of motion.  Lymphadenopathy:     Cervical: No cervical adenopathy.  Skin:    General: Skin is warm.     Capillary Refill: Capillary refill takes less than 2 seconds.  Neurological:     General: No focal deficit present.     Mental Status: She is alert.     Sensory: No sensory deficit.     Motor: No weakness.     ED Results / Procedures / Treatments   Labs (all labs ordered are listed, but only abnormal results are displayed) Labs Reviewed  BASIC METABOLIC PANEL - Abnormal; Notable for the following components:      Result Value   Potassium 2.9 (*)    Glucose, Bld 111 (*)    All other components within normal limits  CBC WITH DIFFERENTIAL/PLATELET    EKG None  Radiology No results found.  Procedures .Epistaxis Management  Date/Time: 08/20/2022 5:34 PM  Performed by: Kem Parkinson, PA-C Authorized by: Kem Parkinson, PA-C   Consent:    Consent obtained:  Verbal   Consent given by:  Patient   Risks discussed:  Bleeding   Alternatives discussed:  No treatment and delayed treatment Universal protocol:    Patient identity confirmed:  Verbally with patient and arm band Anesthesia:    Anesthesia method:  None Procedure details:     Treatment site:  R anterior   Treatment method:  Silver nitrate   Treatment complexity:  Limited   Treatment episode: initial   Post-procedure details:    Assessment:  Bleeding stopped   Procedure completion:  Tolerated well, no immediate complications     Medications Ordered in ED Medications  oxymetazoline (AFRIN) 0.05 % nasal spray 2 spray (2 sprays Each Nare Given by Other 08/20/22 1701)  silver nitrate applicators applicator 1 Stick (1 Stick Topical Given 08/20/22 1725)  potassium chloride SA (KLOR-CON M) CR tablet 40 mEq (40 mEq Oral Given  08/20/22 1920)    ED Course/ Medical Decision Making/ A&P                             Medical Decision Making Patient here with reports of intermittent right-sided nosebleed for several days.  She has been packing her nose intermittently with tissue.  Adequate control of bleeding.  Denies headache, dizziness, or syncope.  Takes aspirin daily but no anticoagulants.  There is a small excoriated area to the lateral nasal mucosa.  Amount and/or Complexity of Data Reviewed Labs: ordered.    Details: Labs interpreted by me, no evidence of leukocytosis and hemoglobin unremarkable.  Chemistries show hypokalemia with potassium of 2.9.  Patient does take chlorthalidone for her blood pressure.  This is the likely cause of her hypokalemia although she takes 40 mill equivalents of potassium in the a.m. and 20 mEq p.m. daily missed doses. Discussion of management or test interpretation with external provider(s): Successful cauterization with silver nitrate of excoriated area to the right nostril.  Patient has been observed in the department without further epistaxis.  She is ambulatory with steady gait. She was given oral potassium here and advised to increase her potassium dose at home to 40 mL equivalents twice daily for the next 3 days then resume her regular dosing.  I have also recommended that she have her potassium levels rechecked later this week.  She  is agreeable to plan.  Felt appropriate for discharge home.  Strict return precautions were given as well as follow-up information for ENT  Risk OTC drugs. Prescription drug management.           Final Clinical Impression(s) / ED Diagnoses Final diagnoses:  Epistaxis  Hypokalemia    Rx / DC Orders ED Discharge Orders     None         Kem Parkinson, PA-C 08/22/22 1436    Sherwood Gambler, MD 08/23/22 2313

## 2022-08-28 ENCOUNTER — Emergency Department (HOSPITAL_COMMUNITY)
Admission: EM | Admit: 2022-08-28 | Discharge: 2022-08-28 | Disposition: A | Discharge status: Home / Self Care | Payer: Medicare Other | Attending: Emergency Medicine | Admitting: Emergency Medicine

## 2022-08-28 ENCOUNTER — Other Ambulatory Visit: Payer: Self-pay

## 2022-08-28 DIAGNOSIS — R040 Epistaxis: Secondary | ICD-10-CM | POA: Diagnosis not present

## 2022-08-28 DIAGNOSIS — E119 Type 2 diabetes mellitus without complications: Secondary | ICD-10-CM | POA: Diagnosis not present

## 2022-08-28 DIAGNOSIS — Z7982 Long term (current) use of aspirin: Secondary | ICD-10-CM | POA: Diagnosis not present

## 2022-08-28 DIAGNOSIS — Z7984 Long term (current) use of oral hypoglycemic drugs: Secondary | ICD-10-CM | POA: Insufficient documentation

## 2022-08-28 DIAGNOSIS — Z79899 Other long term (current) drug therapy: Secondary | ICD-10-CM | POA: Insufficient documentation

## 2022-08-28 DIAGNOSIS — I10 Essential (primary) hypertension: Secondary | ICD-10-CM | POA: Diagnosis not present

## 2022-08-28 MED ORDER — OXYMETAZOLINE HCL 0.05 % NA SOLN
1.0000 | Freq: Once | NASAL | Status: AC
  Administered 2022-08-28: 1 via NASAL
  Filled 2022-08-28: qty 30

## 2022-08-28 NOTE — Discharge Instructions (Signed)
You were seen today for a nosebleed.  This was managed with Afrin only.  You have very friable mucous membranes.  Put a humidifier in your room.  Use nasal saline twice daily.  If nosebleed recurs, blow your nose and use Afrin.  It is important that you follow-up with the ENT.

## 2022-08-28 NOTE — ED Triage Notes (Signed)
Pt c/o nose bleed to right nare since 0230 this morning. was seen here on 1/23 for same and it was cauterized.  Bleeding controlled at this time.

## 2022-08-28 NOTE — ED Provider Notes (Signed)
[facility] Provider Note   CSN: [id number] Arrival date & time: 08/28/22  0350     History  No chief complaint on file.   April Gomez is a 77 y.o. female.  HPI     This is a 77 year old female who presents with a nosebleed.  Patient reports that she had a nosebleed cauterized approximately 1 week ago.  Since that time she followed up with her primary physician put her on another allergy medication.  She has not seen ENT.  She states that she noted slight using yesterday from the right naris.  She woke up this morning with more bleeding.  She did not use Afrin.  She has not used any measures at home.  She does have her heat on at home and does not have a humidifier.  Home Medications Prior to Admission medications   Medication Sig Start Date End Date Taking? Authorizing Provider  albuterol (PROVENTIL HFA;VENTOLIN HFA) 108 (90 Base) MCG/ACT inhaler Inhale 2 puffs into the lungs every 6 (six) hours as needed for wheezing or shortness of breath.    [provider]  amLODipine (NORVASC) 10 MG tablet Take 1 tablet (10 mg total) by mouth daily with breakfast. 06/18/22   Strader, Fransisco Hertz, PA-C  aspirin EC 81 MG tablet Take 81 mg by mouth daily.     [provider]  cetirizine (ZYRTEC) 10 MG tablet Take 10 mg by mouth daily.    [provider]  chlorthalidone (HYGROTON) 25 MG tablet TAKE (1) TABLET BY MOUTH AT BEDTIME. 06/18/22   Strader, [place] M, PA-C  Cholecalciferol (VITAMIN D3) 1.25 MG (50000 UT) CAPS Take by mouth.    [provider]  hydrALAZINE (APRESOLINE) 100 MG tablet Take 1 tablet (100 mg total) by mouth 3 (three) times daily. 06/18/22   Strader, Fransisco Hertz, PA-C  LORazepam (ATIVAN) 1 MG tablet Take 1 mg by mouth every 8 (eight) hours as needed for anxiety.    [provider]  Mesalamine 800 MG TBEC Take 1 tablet by mouth 2 (two) times daily.    [provider]  metFORMIN  (GLUCOPHAGE-XR) 500 MG 24 hr tablet Take 500 mg by mouth every evening.     [provider]  metoprolol tartrate (LOPRESSOR) 50 MG tablet Take 1 tablet (50 mg total) by mouth 2 (two) times daily. 06/18/22   Strader, Fransisco Hertz, PA-C  Multiple Vitamins-Minerals (CENTRUM SILVER PO) Take 1 tablet by mouth daily.     [provider]  pantoprazole (PROTONIX) 40 MG tablet Take 40 mg by mouth daily.  02/23/18   [provider]  potassium chloride SA (KLOR-CON M) 20 MEQ tablet Take 2 tablets in AM and 1 tablet in PM 06/18/22   Strader, [place] M, PA-C  pravastatin (PRAVACHOL) 40 MG tablet Take 1 tablet (40 mg total) by mouth daily. 06/18/22   Strader, Fransisco Hertz, PA-C  umeclidinium-vilanterol (ANORO ELLIPTA) 62.5-25 MCG/INH AEPB Inhale 1 puff into the lungs daily.    [provider]      Allergies    Lisinopril and Ceftin [cefuroxime axetil]    Review of Systems   Review of Systems  HENT:  Positive for nosebleeds.   All other systems reviewed and are negative.   Physical Exam Updated Vital Signs BP (!) 180/70 (BP Location: Left Arm)   Pulse 78   Temp 97.9 F (36.6 C) (Oral)   Resp 17   SpO2 92%  Physical Exam Vitals  and nursing note reviewed.  Constitutional:      Appearance: She is well-developed. She is not ill-appearing.  HENT:     Head: Normocephalic and atraumatic.     Nose:     Comments: No active bleeding or clot noted, generally friable nasal septum, no prior cautery noted Eyes:     Pupils: Pupils are equal, round, and reactive to light.  Cardiovascular:     Rate and Rhythm: Normal rate and regular rhythm.  Pulmonary:     Effort: Pulmonary effort is normal. No respiratory distress.  Abdominal:     Palpations: Abdomen is soft.  Musculoskeletal:     Cervical back: Neck supple.  Skin:    General: Skin is warm and dry.  Neurological:     Mental Status: She is alert and oriented to person, place, and time.  Psychiatric:        Mood and  Affect: Mood normal.     ED Results / Procedures / Treatments   Labs (all labs ordered are listed, but only abnormal results are displayed) Labs Reviewed - No data to display  EKG None  Radiology No results found.  Procedures Procedures    Medications Ordered in ED Medications  oxymetazoline (AFRIN) 0.05 % nasal spray 1 spray (1 spray Each Nare Given 08/28/22 0418)    ED Course/ Medical Decision Making/ A&P                             Medical Decision Making Risk OTC drugs.   This patient presents to the ED for concern of epistaxis, this involves an extensive number of treatment options, and is a complaint that carries with it a high risk of complications and morbidity.  I considered the following differential and admission for this acute, potentially life threatening condition.  The differential diagnosis includes anterior nosebleed, posterior nosebleed  MDM:    This is a 77 year old female who presents with recurrent epistaxis.  She has no evidence of active bleeding on exam although her nasal septum is quite friable appearing.  I do not see where it was previously cauterized and suspect that cauterized area has likely sloughed off.  She has no active bleeding.  There is no clot.  Patient was given Afrin and had no recurrence of bleeding.  Highly suspect that she is oozing from the friable nasal septum.  Do not see a great place to cauterize.  I have recommended that she put a humidifier in her room and use nasal saline twice daily.  Also recommend follow-up with ENT.  Not on any blood thinners.  (Labs, imaging, consults)  Labs: I Ordered, and personally interpreted labs.  The pertinent results include: None  Imaging Studies ordered: I ordered imaging studies including none I independently visualized and interpreted imaging. I agree with the radiologist interpretation  Additional history obtained from husband at bedside.  External records from outside source obtained  and reviewed including prior evaluations  Cardiac Monitoring: The patient was maintained on a cardiac monitor.  I personally viewed and interpreted the cardiac monitored which showed an underlying rhythm of: Sinus rhythm  Reevaluation: After the interventions noted above, I reevaluated the patient and found that they have :resolved  Social Determinants of Health:  lives independently  Disposition: Discharge  Co morbidities that complicate the patient evaluation  Past Medical History:  Diagnosis Date   DDD (degenerative disc disease) CERVICAL AND LUMBAR   Diabetes mellitus  Hematuria    History of bladder cancer 12/25/2012   Hyperlipidemia    Hypertension    IBD (inflammatory bowel disease)    Prolapse of vaginal vault after hysterectomy 12/25/2012   PVC's (premature ventricular contractions)    a. Holter 2017: NSR, rare ventricular ectopy; palpitations correlated with NSR.   Urgency of urination      Medicines Meds ordered this encounter  Medications   oxymetazoline (AFRIN) 0.05 % nasal spray 1 spray    I have reviewed the patients home medicines and have made adjustments as needed  Problem List / ED Course: Problem List Items Addressed This Visit   None Visit Diagnoses     Epistaxis    -  Primary                   Final Clinical Impression(s) / ED Diagnoses Final diagnoses:  Epistaxis    Rx / DC Orders ED Discharge Orders     None         Merryl Hacker, MD 08/28/22 [phone]

## 2022-09-18 DIAGNOSIS — E559 Vitamin D deficiency, unspecified: Secondary | ICD-10-CM | POA: Diagnosis not present

## 2022-09-18 DIAGNOSIS — I10 Essential (primary) hypertension: Secondary | ICD-10-CM | POA: Diagnosis not present

## 2022-09-18 DIAGNOSIS — E1165 Type 2 diabetes mellitus with hyperglycemia: Secondary | ICD-10-CM | POA: Diagnosis not present

## 2022-09-18 DIAGNOSIS — R040 Epistaxis: Secondary | ICD-10-CM | POA: Diagnosis not present

## 2022-09-18 DIAGNOSIS — M8580 Other specified disorders of bone density and structure, unspecified site: Secondary | ICD-10-CM | POA: Diagnosis not present

## 2022-09-24 DIAGNOSIS — K589 Irritable bowel syndrome without diarrhea: Secondary | ICD-10-CM | POA: Diagnosis not present

## 2022-09-24 DIAGNOSIS — R809 Proteinuria, unspecified: Secondary | ICD-10-CM | POA: Diagnosis not present

## 2022-09-24 DIAGNOSIS — I10 Essential (primary) hypertension: Secondary | ICD-10-CM | POA: Diagnosis not present

## 2022-09-24 DIAGNOSIS — E876 Hypokalemia: Secondary | ICD-10-CM | POA: Diagnosis not present

## 2022-09-24 DIAGNOSIS — J309 Allergic rhinitis, unspecified: Secondary | ICD-10-CM | POA: Diagnosis not present

## 2022-09-24 DIAGNOSIS — R040 Epistaxis: Secondary | ICD-10-CM | POA: Diagnosis not present

## 2022-09-24 DIAGNOSIS — E1165 Type 2 diabetes mellitus with hyperglycemia: Secondary | ICD-10-CM | POA: Diagnosis not present

## 2022-09-24 DIAGNOSIS — J449 Chronic obstructive pulmonary disease, unspecified: Secondary | ICD-10-CM | POA: Diagnosis not present

## 2022-09-24 DIAGNOSIS — F419 Anxiety disorder, unspecified: Secondary | ICD-10-CM | POA: Diagnosis not present

## 2022-09-24 DIAGNOSIS — E785 Hyperlipidemia, unspecified: Secondary | ICD-10-CM | POA: Diagnosis not present

## 2022-09-24 DIAGNOSIS — E663 Overweight: Secondary | ICD-10-CM | POA: Diagnosis not present

## 2022-09-24 DIAGNOSIS — M8580 Other specified disorders of bone density and structure, unspecified site: Secondary | ICD-10-CM | POA: Diagnosis not present

## 2023-01-21 DIAGNOSIS — M8580 Other specified disorders of bone density and structure, unspecified site: Secondary | ICD-10-CM | POA: Diagnosis not present

## 2023-01-21 DIAGNOSIS — I10 Essential (primary) hypertension: Secondary | ICD-10-CM | POA: Diagnosis not present

## 2023-01-21 DIAGNOSIS — R809 Proteinuria, unspecified: Secondary | ICD-10-CM | POA: Diagnosis not present

## 2023-01-21 DIAGNOSIS — E559 Vitamin D deficiency, unspecified: Secondary | ICD-10-CM | POA: Diagnosis not present

## 2023-01-21 DIAGNOSIS — E1165 Type 2 diabetes mellitus with hyperglycemia: Secondary | ICD-10-CM | POA: Diagnosis not present

## 2023-01-27 DIAGNOSIS — R809 Proteinuria, unspecified: Secondary | ICD-10-CM | POA: Diagnosis not present

## 2023-01-27 DIAGNOSIS — I10 Essential (primary) hypertension: Secondary | ICD-10-CM | POA: Diagnosis not present

## 2023-01-27 DIAGNOSIS — J449 Chronic obstructive pulmonary disease, unspecified: Secondary | ICD-10-CM | POA: Diagnosis not present

## 2023-01-27 DIAGNOSIS — E663 Overweight: Secondary | ICD-10-CM | POA: Diagnosis not present

## 2023-01-27 DIAGNOSIS — J309 Allergic rhinitis, unspecified: Secondary | ICD-10-CM | POA: Diagnosis not present

## 2023-01-27 DIAGNOSIS — F419 Anxiety disorder, unspecified: Secondary | ICD-10-CM | POA: Diagnosis not present

## 2023-01-27 DIAGNOSIS — E1165 Type 2 diabetes mellitus with hyperglycemia: Secondary | ICD-10-CM | POA: Diagnosis not present

## 2023-01-27 DIAGNOSIS — K589 Irritable bowel syndrome without diarrhea: Secondary | ICD-10-CM | POA: Diagnosis not present

## 2023-01-27 DIAGNOSIS — E876 Hypokalemia: Secondary | ICD-10-CM | POA: Diagnosis not present

## 2023-01-27 DIAGNOSIS — M8580 Other specified disorders of bone density and structure, unspecified site: Secondary | ICD-10-CM | POA: Diagnosis not present

## 2023-01-27 DIAGNOSIS — R040 Epistaxis: Secondary | ICD-10-CM | POA: Diagnosis not present

## 2023-01-27 DIAGNOSIS — E785 Hyperlipidemia, unspecified: Secondary | ICD-10-CM | POA: Diagnosis not present

## 2023-01-28 ENCOUNTER — Other Ambulatory Visit (HOSPITAL_COMMUNITY): Payer: Self-pay | Admitting: Internal Medicine

## 2023-01-28 DIAGNOSIS — Z1231 Encounter for screening mammogram for malignant neoplasm of breast: Secondary | ICD-10-CM

## 2023-03-03 ENCOUNTER — Ambulatory Visit (HOSPITAL_COMMUNITY)
Admission: RE | Admit: 2023-03-03 | Discharge: 2023-03-03 | Disposition: A | Discharge status: Home / Self Care | Payer: Medicare Other | Source: Ambulatory Visit | Attending: Internal Medicine | Admitting: Internal Medicine

## 2023-03-03 DIAGNOSIS — Z1231 Encounter for screening mammogram for malignant neoplasm of breast: Secondary | ICD-10-CM | POA: Diagnosis not present

## 2023-04-23 DIAGNOSIS — N1330 Unspecified hydronephrosis: Secondary | ICD-10-CM | POA: Diagnosis not present

## 2023-04-23 DIAGNOSIS — N132 Hydronephrosis with renal and ureteral calculous obstruction: Secondary | ICD-10-CM | POA: Diagnosis not present

## 2023-04-23 DIAGNOSIS — R319 Hematuria, unspecified: Secondary | ICD-10-CM | POA: Diagnosis not present

## 2023-04-23 DIAGNOSIS — Z9889 Other specified postprocedural states: Secondary | ICD-10-CM | POA: Diagnosis not present

## 2023-04-23 DIAGNOSIS — C679 Malignant neoplasm of bladder, unspecified: Secondary | ICD-10-CM | POA: Diagnosis not present

## 2023-04-23 DIAGNOSIS — N200 Calculus of kidney: Secondary | ICD-10-CM | POA: Diagnosis not present

## 2023-04-23 DIAGNOSIS — Z906 Acquired absence of other parts of urinary tract: Secondary | ICD-10-CM | POA: Diagnosis not present

## 2023-05-05 DIAGNOSIS — Z23 Encounter for immunization: Secondary | ICD-10-CM | POA: Diagnosis not present

## 2023-06-03 DIAGNOSIS — E559 Vitamin D deficiency, unspecified: Secondary | ICD-10-CM | POA: Diagnosis not present

## 2023-06-03 DIAGNOSIS — R809 Proteinuria, unspecified: Secondary | ICD-10-CM | POA: Diagnosis not present

## 2023-06-03 DIAGNOSIS — M8580 Other specified disorders of bone density and structure, unspecified site: Secondary | ICD-10-CM | POA: Diagnosis not present

## 2023-06-03 DIAGNOSIS — E1165 Type 2 diabetes mellitus with hyperglycemia: Secondary | ICD-10-CM | POA: Diagnosis not present

## 2023-06-03 DIAGNOSIS — I10 Essential (primary) hypertension: Secondary | ICD-10-CM | POA: Diagnosis not present

## 2023-06-10 DIAGNOSIS — E785 Hyperlipidemia, unspecified: Secondary | ICD-10-CM | POA: Diagnosis not present

## 2023-06-10 DIAGNOSIS — J449 Chronic obstructive pulmonary disease, unspecified: Secondary | ICD-10-CM | POA: Diagnosis not present

## 2023-06-10 DIAGNOSIS — E1165 Type 2 diabetes mellitus with hyperglycemia: Secondary | ICD-10-CM | POA: Diagnosis not present

## 2023-06-10 DIAGNOSIS — K589 Irritable bowel syndrome without diarrhea: Secondary | ICD-10-CM | POA: Diagnosis not present

## 2023-06-10 DIAGNOSIS — R809 Proteinuria, unspecified: Secondary | ICD-10-CM | POA: Diagnosis not present

## 2023-06-10 DIAGNOSIS — F419 Anxiety disorder, unspecified: Secondary | ICD-10-CM | POA: Diagnosis not present

## 2023-06-10 DIAGNOSIS — E876 Hypokalemia: Secondary | ICD-10-CM | POA: Diagnosis not present

## 2023-06-10 DIAGNOSIS — J309 Allergic rhinitis, unspecified: Secondary | ICD-10-CM | POA: Diagnosis not present

## 2023-06-10 DIAGNOSIS — R040 Epistaxis: Secondary | ICD-10-CM | POA: Diagnosis not present

## 2023-06-10 DIAGNOSIS — I10 Essential (primary) hypertension: Secondary | ICD-10-CM | POA: Diagnosis not present

## 2023-06-10 DIAGNOSIS — E663 Overweight: Secondary | ICD-10-CM | POA: Diagnosis not present

## 2023-06-10 DIAGNOSIS — M8580 Other specified disorders of bone density and structure, unspecified site: Secondary | ICD-10-CM | POA: Diagnosis not present

## 2023-06-11 DIAGNOSIS — Z23 Encounter for immunization: Secondary | ICD-10-CM | POA: Diagnosis not present

## 2023-06-18 ENCOUNTER — Encounter: Payer: Self-pay | Admitting: Student

## 2023-06-18 ENCOUNTER — Ambulatory Visit: Payer: Medicare Other | Attending: Student | Admitting: Student

## 2023-06-18 VITALS — BP 128/70 | HR 68 | Ht 65.0 in | Wt 156.2 lb

## 2023-06-18 DIAGNOSIS — R002 Palpitations: Secondary | ICD-10-CM | POA: Insufficient documentation

## 2023-06-18 DIAGNOSIS — I10 Essential (primary) hypertension: Secondary | ICD-10-CM | POA: Insufficient documentation

## 2023-06-18 DIAGNOSIS — E782 Mixed hyperlipidemia: Secondary | ICD-10-CM | POA: Insufficient documentation

## 2023-06-18 MED ORDER — PRAVASTATIN SODIUM 40 MG PO TABS: 40.0000 mg | ORAL_TABLET | Freq: Every day | ORAL | 11 refills | Status: DC

## 2023-06-18 MED ORDER — POTASSIUM CHLORIDE CRYS ER 20 MEQ PO TBCR: EXTENDED_RELEASE_TABLET | ORAL | 11 refills | Status: DC

## 2023-06-18 MED ORDER — METOPROLOL TARTRATE 50 MG PO TABS: 50.0000 mg | ORAL_TABLET | Freq: Two times a day (BID) | ORAL | 11 refills | Status: DC

## 2023-06-18 MED ORDER — AMLODIPINE BESYLATE 10 MG PO TABS: 10.0000 mg | ORAL_TABLET | Freq: Every day | ORAL | 11 refills | Status: DC

## 2023-06-18 MED ORDER — CHLORTHALIDONE 25 MG PO TABS: ORAL_TABLET | ORAL | 11 refills | Status: DC

## 2023-06-18 MED ORDER — HYDRALAZINE HCL 100 MG PO TABS: 100.0000 mg | ORAL_TABLET | Freq: Three times a day (TID) | ORAL | 11 refills | Status: DC

## 2023-06-18 NOTE — Progress Notes (Signed)
Cardiology Office Note    Date:  06/18/2023  ID:  Annelise, Blizard [birth year], MRN [id number] Cardiologist: Dina Rich, MD    History of Present Illness:    April Gomez is a 77 y.o. female  with past medical history of HTN, HLD, Type II DM, IBD and palpitations who presents to the office today for yearly follow-up.  She was examined by myself in 05/2022 and reported overall doing well at that time and denied any recent anginal symptoms. She was continued on her current medical therapy with Amlodipine 10 mg daily, Chlorthalidone 25 mg daily, Hydralazine 100 mg 3 times daily, Lopressor 50 mg twice daily and Pravastatin 40 mg daily.  In talking with the patient today, she reports overall doing well from a cardiac perspective since her last office visit. Reports her palpitations have overall been well-controlled since significantly reducing her caffeine intake. Denies any recent exertional chest pain or progressive dyspnea on exertion. Remains active in doing chores around the house. No specific orthopnea, PND or pitting edema. Reports good compliance with her current medical therapy. She does have a new 14-month-old great granddaughter and enjoys spending time with her.  Studies Reviewed:   EKG: EKG is ordered today and demonstrates:   EKG Interpretation Date/Time:  Wednesday June 18 2023 14:52:06 EST Ventricular Rate:  66 PR Interval:  172 QRS Duration:  84 QT Interval:  394 QTC Calculation: 413 R Axis:   -55  Text Interpretation: Normal sinus rhythm Left axis deviation  Confirmed by Randall An ([id number]) on 06/18/2023 3:12:26 PM        Echocardiogram: 09/2017 Study Conclusions   - Left ventricle: The cavity size was normal. Wall thickness was    increased in a pattern of moderate LVH. Systolic function was    normal. The estimated ejection fraction was in the range of 60%    to 65%. Wall motion was normal; there were no regional wall    motion  abnormalities. Doppler parameters are consistent with    abnormal left ventricular relaxation (grade 1 diastolic    dysfunction).  - Aortic valve: Valve area (VTI): 2.23 cm^2. Valve area (Vmax):    1.82 cm^2. Valve area (Vmean): 2 cm^2.  - Left atrium: The atrium was moderately dilated.  - Right atrium: The atrium was mildly dilated.  - Atrial septum: No defect or patent foramen ovale was identified.  - Pulmonary arteries: Systolic pressure was mildly increased. PA    peak pressure: 37 mm Hg (S).  - Technically adequate study.    Physical Exam:   VS:  BP 128/70   Pulse 68   Ht 5\' 5"  (1.651 m)   Wt 156 lb 3.2 oz (70.9 kg)   SpO2 97%   BMI 25.99 kg/m    Wt Readings from Last 3 Encounters:  06/18/23 156 lb 3.2 oz (70.9 kg)  08/20/22 160 lb (72.6 kg)  06/18/22 162 lb 6.4 oz (73.7 kg)     GEN: Well nourished, well developed female appearing in no acute distress NECK: No JVD; No carotid bruits CARDIAC: RRR, no murmurs, rubs, gallops RESPIRATORY:  Clear to auscultation without rales, wheezing or rhonchi  ABDOMEN: Appears non-distended. No obvious abdominal masses. EXTREMITIES: No clubbing or cyanosis. No pitting edema.  Distal pedal pulses are 2+ bilaterally.   Assessment and Plan:   1. Palpitations - Symptoms have overall been well-controlled since reducing caffeine intake. She remains on Lopressor 50 mg twice daily. Continue current medical therapy for now.  2. HTN - Blood pressure was initially elevated at 140/68, rechecked and improved to 128/70.  She reports this has also been well-controlled when checked by her PCP. Continue current medical therapy with Amlodipine 10 mg daily, Chlorthalidone 25 mg daily, Hydralazine 100 mg 3 times daily and Lopressor 50 mg twice daily.  3. HLD - Followed by her PCP. FLP earlier this month showed total cholesterol 116, triglycerides 85, HDL 54 and LDL 45. Continue Pravastatin 40mg  daily.   Signed, Ellsworth Lennox, PA-C

## 2023-06-18 NOTE — Patient Instructions (Signed)
Medication Instructions:  Your physician recommends that you continue on your current medications as directed. Please refer to the Current Medication list given to you today.  *If you need a refill on your cardiac medications before your next appointment, please call your pharmacy*   Lab Work: NONE   If you have labs (blood work) drawn today and your tests are completely normal, you will receive your results only by: MyChart Message (if you have MyChart) OR A paper copy in the mail If you have any lab test that is abnormal or we need to change your treatment, we will call you to review the results.   Testing/Procedures: NONE    Follow-Up: At [facility], you and your health needs are our priority.  As part of our continuing mission to provide you with exceptional heart care, we have created designated Provider Care Teams.  These Care Teams include your primary Cardiologist (physician) and Advanced Practice Providers (APPs -  Physician Assistants and Nurse Practitioners) who all work together to provide you with the care you need, when you need it.  We recommend signing up for the patient portal called "MyChart".  Sign up information is provided on this After Visit Summary.  MyChart is used to connect with patients for Virtual Visits (Telemedicine).  Patients are able to view lab/test results, encounter notes, upcoming appointments, etc.  Non-urgent messages can be sent to your provider as well.   To learn more about what you can do with MyChart, go to [email or web address].    Your next appointment:   1 year(s)  Provider:   You may see Branch, Jonathan, MD or one of the following Advanced Practice Providers on your designated Care Team:   Brittany Strader, PA-C  Michele Lenze, PA-C     Other Instructions Thank you for choosing [facility]!    

## 2023-11-23 ENCOUNTER — Encounter: Payer: Self-pay | Admitting: Gastroenterology

## 2023-11-23 NOTE — Progress Notes (Signed)
 Referring Provider: Wendi Ham, NP Primary Care Physician:  Omie Bickers, MD Primary Gastroenterologist:  Dr. Alita Irwin  Chief Complaint  Patient presents with   Irritable Bowel Syndrome    Needs to change medications     HPI:   April Gomez is a 78 y.o. female presenting today at the request of Wendi Ham, NP for IBS.   Reviewed referral information:  Per A&P: IBS- patient on mesalamine  800 mg DR daily. Had been on this for years and was doing well. However, ulcerative colitis is noted in problem list.    Today:  Reports she was diagnosed with ulcerative colitis by Dr. Homero Luster about 15 years ago and has been on mesalamine  800 mg BID ever since and has been doing great. States drug stores didn't have mesalamine . She called all around and no one has it, so she was given mesalamine  DR (Lialda ) 1.2 gm daily. Started 11/13/23. Has noticed some gurgling in her abdomen/like her abdomen is hot a times, but no abdominal pain, diarrhea, or rectal bleeding. Chronically takes senna-S nightly.   When she was first diagnosed, she also had that hot sensation in her stomach. Doesn't remember every having diarrhea or rectal bleeding.   States she never had follow-up colonoscopy after initial diagnosis and is not interested in a colonoscopy at this point.  Needs to get pneumonia vaccine. Plans to discuss with PCP.  Has been vaccinated to singles.  Gets flu vaccine yearly.   No other GI concerns.  Denies nausea, vomiting, heartburn, dysphagia.  Grand daughter is Newt Barefoot, FNP    Past Medical History:  Diagnosis Date   COPD (chronic obstructive pulmonary disease) (HCC)    DDD (degenerative disc disease) CERVICAL AND LUMBAR   Diabetes mellitus    Hematuria    History of bladder cancer 12/25/2012   Hyperlipidemia    Hypertension    IBD (inflammatory bowel disease)    Prolapse of vaginal vault after hysterectomy 12/25/2012   PVC's (premature ventricular contractions)    a.  Holter 2017: NSR, rare ventricular ectopy; palpitations correlated with NSR.   Urgency of urination     Past Surgical History:  Procedure Laterality Date   ABDOMINAL HYSTERECTOMY     BREAST BIOPSY  03-20-2011  DR [facility]   BENIGN LEFT BREAST MASS   BUNIONECTOMY  1980'S   CYSTOSCOPY/RETROGRADE/URETEROSCOPY  05/29/2012   Procedure: CYSTOSCOPY/RETROGRADE/URETEROSCOPY;  Surgeon: Alanson Alliance, MD;  Location: [facility];  Service: Urology;  Laterality: Right;   right  Retrograde Pyelogram    HYSTEROSCOPY WITH D & C  04/17/2012   Procedure: DILATATION AND CURETTAGE /HYSTEROSCOPY;  Surgeon: Albino Hum, MD;  Location: [facility];  Service: Gynecology;  Laterality: N/A;  POLYPECTOMY   TRANSURETHRAL RESECTION OF BLADDER TUMOR  05/29/2012   Procedure: TRANSURETHRAL RESECTION OF BLADDER TUMOR (TURBT);  Surgeon: Mark C Ottelin, MD;  Location: [facility];  Service: Urology;  Laterality: N/A;  [facility]   TUBAL LIGATION  1984    Current Outpatient Medications  Medication Sig Dispense Refill   albuterol  (PROVENTIL  HFA;VENTOLIN  HFA) 108 (90 Base) MCG/ACT inhaler Inhale 2 puffs into the lungs every 6 (six) hours as needed for wheezing or shortness of breath.     amLODipine  (NORVASC ) 10 MG tablet Take 1 tablet (10 mg total) by mouth daily with breakfast. 30 tablet 11   aspirin EC 81 MG tablet Take 81 mg by mouth daily.      cetirizine (ZYRTEC) 10 MG tablet Take 10  mg by mouth daily.     chlorthalidone  (HYGROTON ) 25 MG tablet TAKE (1) TABLET BY MOUTH AT BEDTIME. 30 tablet 11   Cholecalciferol (VITAMIN D3) 1.25 MG (50000 UT) CAPS Take by mouth.     hydrALAZINE  (APRESOLINE ) 100 MG tablet Take 1 tablet (100 mg total) by mouth 3 (three) times daily. 90 tablet 11   LORazepam  (ATIVAN ) 1 MG tablet Take 1 mg by mouth every 8 (eight) hours as needed for anxiety.     metFORMIN (GLUCOPHAGE-XR) 500 MG 24 hr tablet Take 500 mg by mouth every evening.      metoprolol  tartrate (LOPRESSOR ) 50 MG  tablet Take 1 tablet (50 mg total) by mouth 2 (two) times daily. 60 tablet 11   Multiple Vitamins-Minerals (CENTRUM SILVER  PO) Take 1 tablet by mouth daily.      pantoprazole (PROTONIX) 40 MG tablet Take 40 mg by mouth daily.   11   potassium chloride  SA (KLOR-CON  M) 20 MEQ tablet Take 2 tablets in AM and 1 tablet in PM 90 tablet 11   pravastatin  (PRAVACHOL ) 40 MG tablet Take 1 tablet (40 mg total) by mouth daily. 30 tablet 11   umeclidinium-vilanterol (ANORO ELLIPTA ) 62.5-25 MCG/INH AEPB Inhale 1 puff into the lungs daily.     mesalamine  (LIALDA ) 1.2 g EC tablet Take 2 tablets (2.4 g total) by mouth daily with breakfast. Take once a day 60 tablet 5   No current facility-administered medications for this visit.   Facility-Administered Medications Ordered in Other Visits  Medication Dose Route Frequency Provider Last Rate Last Admin   mitomycin  (MUTAMYCIN ) chemo injection 40 mg  40 mg Bladder Instillation Once Ottelin, Mark, MD        Allergies as of 11/26/2023 - Review Complete 11/26/2023  Allergen Reaction Noted   Lisinopril Swelling 09/12/2015   Ceftin [cefuroxime axetil] Rash 02/26/2011   Cefuroxime axetil Rash 09/08/2012    Family History  Problem Relation Age of Onset   Heart disease Mother        Heart attack and stroke age 44   COPD Father    Cancer Sister        breast    Diabetes Brother    Cancer Brother        prostate   Heart attack Sister        62   Heart disease Sister 49       Defibrillator    Social History   Socioeconomic History   Marital status: Married    Spouse name: Not on file   Number of children: Not on file   Years of education: Not on file   Highest education level: Not on file  Occupational History   Not on file  Tobacco Use   Smoking status: Former    Current packs/day: 0.00    Types: Cigarettes    Start date: 05/26/1978    Quit date: 05/26/1988    Years since quitting: 35.5   Smokeless tobacco: Never  Vaping Use   Vaping status:  Never Used  Substance and Sexual Activity   Alcohol use: No   Drug use: No   Sexual activity: Never  Other Topics Concern   Not on file  Social History Narrative   Not on file   Social Drivers of Health   Financial Resource Strain: Not on file  Food Insecurity: Not on file  Transportation Needs: Not on file  Physical Activity: Not on file  Stress: Not on file  Social Connections: Not on  file  Intimate Partner Violence: Not on file    Review of Systems: Gen: Denies any fever, chills, cold or flulike symptoms, presyncope, syncope. CV: Denies chest pain, heart palpitations. Resp: Denies shortness of breath, cough.  GI: See HPI GU : Denies urinary burning, urinary frequency, urinary hesitancy MS: Denies joint pain. Derm: Denies rash. Psych: Denies depression, anxiety. Heme: See HPI  Physical Exam: BP 138/77 (BP Location: Right Arm, Patient Position: Sitting, Cuff Size: Normal)   Pulse 85   Temp 98.2 F (36.8 C) (Temporal)   Ht 5\' 6"  (1.676 m)   Wt 162 lb 9.6 oz (73.8 kg)   BMI 26.24 kg/m  General:   Alert and oriented. Pleasant and cooperative. Well-nourished and well-developed.  Head:  Normocephalic and atraumatic. Eyes:  Without icterus, sclera clear and conjunctiva pink.  Ears:  Normal auditory acuity. Lungs:  Clear to auscultation bilaterally. No wheezes, rales, or rhonchi. No distress.  Heart:  S1, S2 present without murmurs appreciated.  Abdomen:  +BS, soft, non-tender and non-distended. No HSM noted. No guarding or rebound. No masses appreciated.  Rectal:  Deferred  Msk:  Symmetrical without gross deformities. Normal posture. Extremities:  Without edema. Neurologic:  Alert and  oriented x4;  grossly normal neurologically. Skin:  Intact without significant lesions or rashes. Psych:  Normal mood and affect.    Assessment:  78 year old female with history of HTN, HLD, PVCs, COPD, diabetes, IBD reported to be ulcerative colitis, presenting today for follow-up  on ulcerative colitis due to medication issues.   IBD/ulcerative colitis: Patient reports being diagnosed with ulcerative colitis about 15 years ago by Dr. Homero Luster and has been on mesalamine  800 mg twice daily since that time and has had very well.  Unfortunately, I do not have any record of this.  No colonoscopy report on file, and patient has not followed up with GI in at least the last 10+ years as there are no records in our system regarding this.  Patient reports recent issues with obtaining mesalamine  from pharmacy as it seems that no pharmacy is carrying mesalamine  and she was given Lialda  1.2 g daily which she started on 11/13/2023.  She continues to do fairly well but has noted a hot sensation intermittently in her abdomen and states that she had similar symptoms when she was first diagnosed.  Denies abdominal pain, diarrhea, rectal bleeding, melena.  We discussed the fact that she has not had a colonoscopy since her original diagnosis, but she has not interested in a colonoscopy considering her age and the fact that she has been doing well.  At this point, I will check inflammatory markers and have her continue Lialda , but increase to typical maintenance dose of 2.4 g daily.  Recent BMP on file 08/20/2022 with creatinine within normal limits.   Plan:  CRP, fecal calprotectin Increase Lialda  to 2.4 g daily. Follow-up in 6 months.   Shana Daring, PA-C [facility] 11/26/2023

## 2023-11-26 ENCOUNTER — Ambulatory Visit (INDEPENDENT_AMBULATORY_CARE_PROVIDER_SITE_OTHER): Admitting: Gastroenterology

## 2023-11-26 ENCOUNTER — Encounter: Payer: Self-pay | Admitting: Gastroenterology

## 2023-11-26 VITALS — BP 138/77 | HR 85 | Temp 98.2°F | Ht 66.0 in | Wt 162.6 lb

## 2023-11-26 DIAGNOSIS — K589 Irritable bowel syndrome without diarrhea: Secondary | ICD-10-CM | POA: Diagnosis not present

## 2023-11-26 DIAGNOSIS — K5190 Ulcerative colitis, unspecified, without complications: Secondary | ICD-10-CM

## 2023-11-26 MED ORDER — MESALAMINE 1.2 G PO TBEC: 2.4000 g | DELAYED_RELEASE_TABLET | Freq: Every day | ORAL | 5 refills | Status: DC

## 2023-11-26 NOTE — Patient Instructions (Signed)
 Increase Lialda (mesalamine) to 2.4 mg daily. I have sent in a new prescription to your pharmacy.  Please have blood work and stool testing completed at your primary care's office.  Please request that they fax results back to our office.  I will plan to see you back in 6 months or sooner if needed.  Shana Daring, PA-C [facility]

## 2023-11-28 ENCOUNTER — Encounter: Payer: Self-pay | Admitting: Gastroenterology

## 2023-12-03 DIAGNOSIS — I10 Essential (primary) hypertension: Secondary | ICD-10-CM | POA: Diagnosis not present

## 2023-12-03 DIAGNOSIS — E1165 Type 2 diabetes mellitus with hyperglycemia: Secondary | ICD-10-CM | POA: Diagnosis not present

## 2023-12-03 DIAGNOSIS — R809 Proteinuria, unspecified: Secondary | ICD-10-CM | POA: Diagnosis not present

## 2023-12-09 DIAGNOSIS — K589 Irritable bowel syndrome without diarrhea: Secondary | ICD-10-CM | POA: Diagnosis not present

## 2023-12-09 DIAGNOSIS — M8580 Other specified disorders of bone density and structure, unspecified site: Secondary | ICD-10-CM | POA: Diagnosis not present

## 2023-12-09 DIAGNOSIS — J449 Chronic obstructive pulmonary disease, unspecified: Secondary | ICD-10-CM | POA: Diagnosis not present

## 2023-12-09 DIAGNOSIS — F419 Anxiety disorder, unspecified: Secondary | ICD-10-CM | POA: Diagnosis not present

## 2023-12-09 DIAGNOSIS — E663 Overweight: Secondary | ICD-10-CM | POA: Diagnosis not present

## 2023-12-09 DIAGNOSIS — I10 Essential (primary) hypertension: Secondary | ICD-10-CM | POA: Diagnosis not present

## 2023-12-09 DIAGNOSIS — R040 Epistaxis: Secondary | ICD-10-CM | POA: Diagnosis not present

## 2023-12-09 DIAGNOSIS — E876 Hypokalemia: Secondary | ICD-10-CM | POA: Diagnosis not present

## 2023-12-09 DIAGNOSIS — E785 Hyperlipidemia, unspecified: Secondary | ICD-10-CM | POA: Diagnosis not present

## 2023-12-09 DIAGNOSIS — R809 Proteinuria, unspecified: Secondary | ICD-10-CM | POA: Diagnosis not present

## 2023-12-09 DIAGNOSIS — J309 Allergic rhinitis, unspecified: Secondary | ICD-10-CM | POA: Diagnosis not present

## 2023-12-09 DIAGNOSIS — E1165 Type 2 diabetes mellitus with hyperglycemia: Secondary | ICD-10-CM | POA: Diagnosis not present

## 2024-01-26 ENCOUNTER — Other Ambulatory Visit (HOSPITAL_COMMUNITY): Payer: Self-pay | Admitting: Internal Medicine

## 2024-01-26 DIAGNOSIS — Z1231 Encounter for screening mammogram for malignant neoplasm of breast: Secondary | ICD-10-CM

## 2024-03-03 ENCOUNTER — Ambulatory Visit (HOSPITAL_COMMUNITY)
Admission: RE | Admit: 2024-03-03 | Discharge: 2024-03-03 | Disposition: A | Discharge status: Home / Self Care | Source: Ambulatory Visit | Attending: Internal Medicine | Admitting: Internal Medicine

## 2024-03-03 ENCOUNTER — Encounter (HOSPITAL_COMMUNITY): Payer: Self-pay

## 2024-03-03 DIAGNOSIS — Z1231 Encounter for screening mammogram for malignant neoplasm of breast: Secondary | ICD-10-CM | POA: Diagnosis not present

## 2024-04-08 ENCOUNTER — Encounter: Payer: Self-pay | Admitting: Gastroenterology

## 2024-04-14 DIAGNOSIS — Z7189 Other specified counseling: Secondary | ICD-10-CM | POA: Diagnosis not present

## 2024-04-22 DIAGNOSIS — N132 Hydronephrosis with renal and ureteral calculous obstruction: Secondary | ICD-10-CM | POA: Diagnosis not present

## 2024-04-22 DIAGNOSIS — N281 Cyst of kidney, acquired: Secondary | ICD-10-CM | POA: Diagnosis not present

## 2024-04-22 DIAGNOSIS — N200 Calculus of kidney: Secondary | ICD-10-CM | POA: Diagnosis not present

## 2024-04-29 DIAGNOSIS — Z23 Encounter for immunization: Secondary | ICD-10-CM | POA: Diagnosis not present

## 2024-05-17 ENCOUNTER — Other Ambulatory Visit: Payer: Self-pay | Admitting: Gastroenterology

## 2024-05-17 DIAGNOSIS — K5190 Ulcerative colitis, unspecified, without complications: Secondary | ICD-10-CM

## 2024-05-21 NOTE — Progress Notes (Unsigned)
 GI Office Note    Referring Provider: Shona Norleen PEDLAR, MD Primary Care Physician:  Shona Norleen PEDLAR, MD Primary Gastroenterologist: Deatrice Casilda Dine, MD  Date:  06/01/2024  ID:  JENICA COSTILOW, DOB 08-03-1945, MRN [id number]   Chief Complaint   Chief Complaint  Patient presents with   Follow-up    Follow up. No problems    History of Present Illness  April Gomez is a 78 y.o. female with a history of UC vs iBS?, HTN, HLD, COPD, diabetes, and bladder cancer in 2014 s/p TURP presenting today for follow up of IBD/UC and medication refill.   Prior colonoscopy record not on file in [facility] or care everywhere.   Last office visit 11/26/23. She reported she was diagnosed with UC about 2010 by Dr. Golda and placed on mesalamine  (800 mg BID) but stores no longer had this available so she was sent in Lialda  1.2g daily. Feeling a hot sensation to her abdomen at times without pain and gurgling sensation as well. Denies any rectal bleeding or diarrhea. States the hot sensation was present when first diagnosed as well. Taking senna-S nightly. No follow up colonoscopy. Planned to discuss pneumonia vaccine with pcp, has received shingles vaccine and yearly flu vaccines. No GI concerns. Increased Lialda  to 2.4g daily. Check CRP and fecal calprotectin.   Grand daughter is Bobetta Creed, NP (a local primary care provider)  Labs and fecal cal not completed from last OV.   Labs May 2025: normal LFTs, no leukocytosis. Normal Hgb.   Today:  Last flu shot: 04/29/2024 Last pneumonia shot: Going to get with PCP after RSV Skin : no issues Last zoster vaccine: Has been vaccinated Last DEXA scan: 2018 (osteopenia)- currently on Vitamin D3 2000 units daily.  COVID-19 shot: considering getting her COVID booster today  Discussed the use of AI scribe software for clinical note transcription with the patient, who gave verbal consent to proceed.  She has been taking mesalamine  (Lialda ) at a dose of 2.4  grams daily, administered as two tablets in the morning. No gastrointestinal concerns such as constipation or diarrhea have been noted since starting this medication. Previously, she was on Asacol  800 mg twice daily, but it became unavailable, prompting the referral.  Her initial diagnosis of colitis was made during a routine check-up where a polyp was removed, and irritation was noted. She recalls experiencing a 'hot sensation' in her stomach, described as a burning feeling, particularly when she was without medication. However, since resuming Lialda , she has not experienced these symptoms.  No black or bloody stools. She reports a good appetite with no weight loss. She has a history of a urostomy, which she has had for approximately 11 years following the removal of her bladder. She manages the urostomy with care to prevent leaks and maintain cleanliness, though she notes feeling insecure about it at times.  She takes vitamin D3 at a dose of 2000 units daily, as it has been a while since her last bone density scan. No recent skin issues, such as rashes or new spots. No heartburn, nausea, vomiting, or trouble swallowing.      Wt Readings from Last 6 Encounters:  06/01/24 161 lb 12.8 oz (73.4 kg)  11/26/23 162 lb 9.6 oz (73.8 kg)  06/18/23 156 lb 3.2 oz (70.9 kg)  08/20/22 160 lb (72.6 kg)  06/18/22 162 lb 6.4 oz (73.7 kg)  06/07/21 165 lb 12.8 oz (75.2 kg)    Body mass index is 26.52  kg/m.   Current Outpatient Medications  Medication Sig Dispense Refill   albuterol  (PROVENTIL  HFA;VENTOLIN  HFA) 108 (90 Base) MCG/ACT inhaler Inhale 2 puffs into the lungs every 6 (six) hours as needed for wheezing or shortness of breath.     amLODipine  (NORVASC ) 10 MG tablet Take 1 tablet (10 mg total) by mouth daily with breakfast. 30 tablet 11   aspirin EC 81 MG tablet Take 81 mg by mouth daily.      cetirizine (ZYRTEC) 10 MG tablet Take 10 mg by mouth daily.     chlorthalidone  (HYGROTON ) 25 MG tablet  TAKE (1) TABLET BY MOUTH AT BEDTIME. 30 tablet 11   Cholecalciferol (VITAMIN D3) 1.25 MG (50000 UT) CAPS Take by mouth.     hydrALAZINE  (APRESOLINE ) 100 MG tablet Take 1 tablet (100 mg total) by mouth 3 (three) times daily. 90 tablet 11   LORazepam  (ATIVAN ) 1 MG tablet Take 1 mg by mouth every 8 (eight) hours as needed for anxiety.     mesalamine  (LIALDA ) 1.2 g EC tablet Take 2 tablets (2.4 g total) by mouth daily with breakfast. Take once a day 60 tablet 5   metFORMIN (GLUCOPHAGE-XR) 500 MG 24 hr tablet Take 500 mg by mouth every evening.      metoprolol  tartrate (LOPRESSOR ) 50 MG tablet Take 1 tablet (50 mg total) by mouth 2 (two) times daily. 60 tablet 11   Multiple Vitamins-Minerals (CENTRUM SILVER  PO) Take 1 tablet by mouth daily.      pantoprazole (PROTONIX) 40 MG tablet Take 40 mg by mouth daily.   11   potassium chloride  SA (KLOR-CON  M) 20 MEQ tablet Take 2 tablets in AM and 1 tablet in PM 90 tablet 11   pravastatin  (PRAVACHOL ) 40 MG tablet Take 1 tablet (40 mg total) by mouth daily. 30 tablet 11   umeclidinium-vilanterol (ANORO ELLIPTA ) 62.5-25 MCG/INH AEPB Inhale 1 puff into the lungs daily.     No current facility-administered medications for this visit.   Facility-Administered Medications Ordered in Other Visits  Medication Dose Route Frequency Provider Last Rate Last Admin   mitomycin  (MUTAMYCIN ) chemo injection 40 mg  40 mg Bladder Instillation Once Ottelin, Mark, MD        Past Medical History:  Diagnosis Date   COPD (chronic obstructive pulmonary disease) (HCC)    DDD (degenerative disc disease) CERVICAL AND LUMBAR   Diabetes mellitus    Hematuria    History of bladder cancer 12/25/2012   Hyperlipidemia    Hypertension    IBD (inflammatory bowel disease)    Prolapse of vaginal vault after hysterectomy 12/25/2012   PVC's (premature ventricular contractions)    a. Holter 2017: NSR, rare ventricular ectopy; palpitations correlated with NSR.   Urgency of urination      Past Surgical History:  Procedure Laterality Date   ABDOMINAL HYSTERECTOMY     BREAST BIOPSY  03-20-2011  DR [facility]   BENIGN LEFT BREAST MASS   BUNIONECTOMY  1980'S   CYSTOSCOPY/RETROGRADE/URETEROSCOPY  05/29/2012   Procedure: CYSTOSCOPY/RETROGRADE/URETEROSCOPY;  Surgeon: Oneil JAYSON Rafter, MD;  Location: [facility];  Service: Urology;  Laterality: Right;   right  Retrograde Pyelogram    HYSTEROSCOPY WITH D & C  04/17/2012   Procedure: DILATATION AND CURETTAGE /HYSTEROSCOPY;  Surgeon: Norleen LULLA Server, MD;  Location: [facility];  Service: Gynecology;  Laterality: N/A;  POLYPECTOMY   TRANSURETHRAL RESECTION OF BLADDER TUMOR  05/29/2012   Procedure: TRANSURETHRAL RESECTION OF BLADDER TUMOR (TURBT);  Surgeon: Mark C Ottelin, MD;  Location: [facility];  Service: Urology;  Laterality: N/A;  [facility]   TUBAL LIGATION  1984    Family History  Problem Relation Age of Onset   Heart disease Mother        Heart attack and stroke age 79   COPD Father    Breast cancer Sister    Cancer Sister        breast    Heart attack Sister        11   Heart disease Sister 58       Defibrillator   Diabetes Brother    Cancer Brother        prostate    Allergies as of 06/01/2024 - Review Complete 06/01/2024  Allergen Reaction Noted   Lisinopril Swelling 09/12/2015   Ceftin [cefuroxime axetil] Rash 02/26/2011   Cefuroxime axetil Rash 09/08/2012    Social History   Socioeconomic History   Marital status: Married    Spouse name: Not on file   Number of children: Not on file   Years of education: Not on file   Highest education level: Not on file  Occupational History   Not on file  Tobacco Use   Smoking status: Former    Current packs/day: 0.00    Types: Cigarettes    Start date: 05/26/1978    Quit date: 05/26/1988    Years since quitting: 36.0   Smokeless tobacco: Never  Vaping Use   Vaping status: Never Used  Substance and Sexual Activity   Alcohol use: No    Drug use: No   Sexual activity: Never  Other Topics Concern   Not on file  Social History Narrative   Not on file   Social Drivers of Health   Financial Resource Strain: Not on file  Food Insecurity: Not on file  Transportation Needs: Not on file  Physical Activity: Not on file  Stress: Not on file  Social Connections: Not on file    Review of Systems   Gen: Denies fever, chills, anorexia. Denies fatigue, weakness, weight loss.  CV: Denies chest pain, palpitations, syncope, peripheral edema, and claudication. Resp: Denies dyspnea at rest, cough, wheezing, coughing up blood, and pleurisy. GI: See HPI Derm: Denies rash, itching, dry skin + itchy eyes Psych: Denies depression, anxiety, memory loss, confusion. No homicidal or suicidal ideation.  Heme: Denies bruising, bleeding, and enlarged lymph nodes.  Physical Exam   BP 136/67 (BP Location: Right Arm, Patient Position: Sitting, Cuff Size: Normal)   Pulse 67   Temp 97.6 F (36.4 C) (Temporal)   Ht 5' 5.5 (1.664 m)   Wt 161 lb 12.8 oz (73.4 kg)   BMI 26.52 kg/m   General:   Alert and oriented. No distress noted. Pleasant and cooperative.  Head:  Normocephalic and atraumatic. Eyes:  Conjuctiva clear without scleral icterus. Abdomen:  +BS, soft, non-tender and non-distended. Urostomy present to right mid lower abdomen. No rebound or guarding. No HSM or masses noted. Rectal: deferred Msk:  Symmetrical without gross deformities. Normal posture. Extremities:  Without edema. Neurologic:  Alert and  oriented x4 Psych:  Alert and cooperative. Normal mood and affect.  Assessment & Plan  VERANIA SALBERG is a 78 y.o. female presenting today for follow up of UC.      Ulcerative colitis on mesalamine  therapy Reportedly diagnosed with UC by Dr. Golda around 2010 and was started on Asacol  800 mg twice daily and had been taking up until earlier this year when they were  unable to find the medication. Ulcerative colitis is  well-controlled with mesalamine  (Lialda ) 2.4 grams daily which was started at her last visit. No gastrointestinal symptoms such as constipation, diarrhea, or blood in stool. No recent episodes of burning sensation in the abdomen since starting Lialda . No weight loss or appetite issues. Recommended baseline fecal calprotectin.  - Continue mesalamine  (Lialda ) 2.4 grams daily. - Monitor for any changes in symptoms such as mucus or blood in stool, weight loss, abdominal pain.  - Fecal calprotectin to have baseline - If symptoms develop, will consider stool sample to recheck inflammatory markers.      Follow up   Follow up 1 year.     Charmaine Melia, MSN, FNP-BC, AGACNP-BC [facility]

## 2024-05-26 ENCOUNTER — Telehealth: Payer: Self-pay | Admitting: *Deleted

## 2024-05-26 NOTE — Telephone Encounter (Signed)
 Pt called needs a refill on mesalamine . Pt last OV 11/26/2023

## 2024-05-27 NOTE — Telephone Encounter (Signed)
 Noted

## 2024-06-01 ENCOUNTER — Encounter: Payer: Self-pay | Admitting: Gastroenterology

## 2024-06-01 ENCOUNTER — Ambulatory Visit (INDEPENDENT_AMBULATORY_CARE_PROVIDER_SITE_OTHER): Admitting: Gastroenterology

## 2024-06-01 VITALS — BP 136/67 | HR 67 | Temp 97.6°F | Ht 65.5 in | Wt 161.8 lb

## 2024-06-01 DIAGNOSIS — K5190 Ulcerative colitis, unspecified, without complications: Secondary | ICD-10-CM

## 2024-06-01 NOTE — Patient Instructions (Addendum)
 Continue Lialda  two 1.2g tablets daily (2.4g daily).   Monitor for rectal bleeding, mucus in your stools, abdominal pain, changes in weight.  Please continue to have regular blood work with your primary care provider.  Continue taking your vitamin D.  We do recommend staying up-to-date on COVID, RSV, pneumonia and flu vaccines.  Continue to work toward getting your COVID, RSV and pneumonia vaccination at your earliest convenience.  I will see you back in 1 year, sooner if needed.  It was a pleasure to see you today. I want to create trusting relationships with patients. If you receive a survey regarding your visit,  I greatly appreciate you taking time to fill this out on paper or through your MyChart. I value your feedback.  Charmaine Melia, MSN, FNP-BC, AGACNP-BC [facility]

## 2024-06-03 DIAGNOSIS — I10 Essential (primary) hypertension: Secondary | ICD-10-CM | POA: Diagnosis not present

## 2024-06-03 DIAGNOSIS — E1165 Type 2 diabetes mellitus with hyperglycemia: Secondary | ICD-10-CM | POA: Diagnosis not present

## 2024-06-03 DIAGNOSIS — R809 Proteinuria, unspecified: Secondary | ICD-10-CM | POA: Diagnosis not present

## 2024-06-03 DIAGNOSIS — K5190 Ulcerative colitis, unspecified, without complications: Secondary | ICD-10-CM | POA: Diagnosis not present

## 2024-06-06 ENCOUNTER — Other Ambulatory Visit: Payer: Self-pay | Admitting: Student

## 2024-06-08 ENCOUNTER — Ambulatory Visit: Payer: Self-pay | Admitting: Gastroenterology

## 2024-06-08 LAB — CALPROTECTIN, FECAL: Calprotectin, Fecal: 205 ug/g — ABNORMAL HIGH (ref 0–120)

## 2024-06-09 DIAGNOSIS — E1165 Type 2 diabetes mellitus with hyperglycemia: Secondary | ICD-10-CM | POA: Diagnosis not present

## 2024-06-09 DIAGNOSIS — Z0000 Encounter for general adult medical examination without abnormal findings: Secondary | ICD-10-CM | POA: Diagnosis not present

## 2024-06-09 DIAGNOSIS — I10 Essential (primary) hypertension: Secondary | ICD-10-CM | POA: Diagnosis not present

## 2024-06-09 DIAGNOSIS — J449 Chronic obstructive pulmonary disease, unspecified: Secondary | ICD-10-CM | POA: Diagnosis not present

## 2024-06-09 DIAGNOSIS — Z0001 Encounter for general adult medical examination with abnormal findings: Secondary | ICD-10-CM | POA: Diagnosis not present

## 2024-06-09 DIAGNOSIS — E785 Hyperlipidemia, unspecified: Secondary | ICD-10-CM | POA: Diagnosis not present

## 2024-06-14 ENCOUNTER — Other Ambulatory Visit: Payer: Self-pay | Admitting: Student

## 2024-06-27 ENCOUNTER — Other Ambulatory Visit: Payer: Self-pay | Admitting: Student

## 2024-07-04 ENCOUNTER — Other Ambulatory Visit: Payer: Self-pay | Admitting: Student

## 2024-07-13 NOTE — Progress Notes (Unsigned)
 Cardiology Office Note    Date:  07/15/2024  ID:  April Gomez, DOB January 21, 1946, MRN [id number] Cardiologist: Alvan Carrier, MD Cardiology APP:  Johnson Laymon HERO, PA-C { : History of Present Illness:    April Gomez is a 78 y.o. female with past medical history of HTN, HLD, Type II DM, IBD and palpitations who presents to the office today for annual follow-up.  She was last examined by myself in 05/2023 and reported her palpitations had been well-controlled since reducing her caffeine intake. She denied any recent anginal symptoms. No changes were made to her cardiac medications and she was continued on Amlodipine  10 mg daily, ASA 81 mg daily, Chlorthalidone  25 mg daily, Lopressor  50 mg twice daily and Pravastatin  40 mg daily.  In talking with the patient today, she reports overall doing well since her last office visit. She remains active in doing routine chores around her home and also does yard work. Enjoys going shopping routinely as well and helps to take care of her great-granddaughter. She denies any recent chest pain or dyspnea on exertion. No recent palpitations, orthopnea, PND or pitting edema. Reports her blood pressure has overall been well-controlled when checked at home.  Studies Reviewed:   EKG: EKG is ordered today and demonstrates:   EKG Interpretation Date/Time:  Thursday July 15 2024 11:27:27 EST Ventricular Rate:  74 PR Interval:  170 QRS Duration:  90 QT Interval:  404 QTC Calculation: 448 R Axis:   63  Text Interpretation: Normal sinus rhythm Possible Left atrial enlargement Confirmed by Johnson Laymon ([id number]) on 07/15/2024 11:34:48 AM       Echocardiogram: 09/2017 Study Conclusions   - Left ventricle: The cavity size was normal. Wall thickness was    increased in a pattern of moderate LVH. Systolic function was    normal. The estimated ejection fraction was in the range of 60%    to 65%. Wall motion was normal; there were no regional  wall    motion abnormalities. Doppler parameters are consistent with    abnormal left ventricular relaxation (grade 1 diastolic    dysfunction).  - Aortic valve: Valve area (VTI): 2.23 cm^2. Valve area (Vmax):    1.82 cm^2. Valve area (Vmean): 2 cm^2.  - Left atrium: The atrium was moderately dilated.  - Right atrium: The atrium was mildly dilated.  - Atrial septum: No defect or patent foramen ovale was identified.  - Pulmonary arteries: Systolic pressure was mildly increased. PA    peak pressure: 37 mm Hg (S).  - Technically adequate study.    Physical Exam:   VS:  BP (!) 132/56 (BP Location: Left Arm, Cuff Size: Normal)   Pulse 76   Ht 5' 6 (1.676 m)   Wt 157 lb (71.2 kg)   SpO2 96%   BMI 25.34 kg/m    Wt Readings from Last 3 Encounters:  07/15/24 157 lb (71.2 kg)  06/01/24 161 lb 12.8 oz (73.4 kg)  11/26/23 162 lb 9.6 oz (73.8 kg)     GEN: Pleasant female appearing in no acute distress NECK: No JVD; No carotid bruits CARDIAC: RRR, no murmurs, rubs, gallops RESPIRATORY:  Clear to auscultation without rales, wheezing or rhonchi  ABDOMEN: Appears non-distended. No obvious abdominal masses. EXTREMITIES: No clubbing or cyanosis. No pitting edema.  Distal pedal pulses are 2+ bilaterally.   Assessment and Plan:   1. Palpitations - She reports her palpitations have overall been well-controlled. She does try to limit her caffeine intake.  Continue current medical therapy with Lopressor  50 mg twice daily.  2. Essential hypertension - Her blood pressure is well-controlled at 132/56 during today's visit and has been well-controlled when checked at home Continue current medical therapy with Amlodipine  10 mg daily, Chlorthalidone  25 mg daily, Hydralazine  100 mg 3 times daily and Lopressor  50 mg twice daily. Creatinine was stable at 0.9 when checked in 05/2024.  3. Mixed hyperlipidemia - Followed by her PCP. FLP in 05/2024 showed total cholesterol 134, triglycerides 92, HDL 65 and  LDL 52. AST 19 and ALT 14. Continue current medical therapy with Pravastatin  40 mg daily.   Signed, Laymon CHRISTELLA Qua, PA-C

## 2024-07-15 ENCOUNTER — Ambulatory Visit: Attending: Student | Admitting: Student

## 2024-07-15 ENCOUNTER — Encounter: Payer: Self-pay | Admitting: Student

## 2024-07-15 VITALS — BP 132/56 | HR 76 | Ht 66.0 in | Wt 157.0 lb

## 2024-07-15 DIAGNOSIS — I10 Essential (primary) hypertension: Secondary | ICD-10-CM | POA: Insufficient documentation

## 2024-07-15 DIAGNOSIS — R002 Palpitations: Secondary | ICD-10-CM | POA: Diagnosis present

## 2024-07-15 DIAGNOSIS — E782 Mixed hyperlipidemia: Secondary | ICD-10-CM | POA: Insufficient documentation

## 2024-07-15 MED ORDER — CHLORTHALIDONE 25 MG PO TABS: ORAL_TABLET | ORAL | 3 refills | Status: AC

## 2024-07-15 MED ORDER — METOPROLOL TARTRATE 50 MG PO TABS: 50.0000 mg | ORAL_TABLET | Freq: Two times a day (BID) | ORAL | 3 refills | Status: AC

## 2024-07-15 MED ORDER — HYDRALAZINE HCL 100 MG PO TABS: 100.0000 mg | ORAL_TABLET | Freq: Three times a day (TID) | ORAL | 3 refills | Status: AC

## 2024-07-15 MED ORDER — PRAVASTATIN SODIUM 40 MG PO TABS: 40.0000 mg | ORAL_TABLET | Freq: Every day | ORAL | 3 refills | Status: AC

## 2024-07-15 MED ORDER — POTASSIUM CHLORIDE CRYS ER 20 MEQ PO TBCR: EXTENDED_RELEASE_TABLET | ORAL | 3 refills | Status: AC

## 2024-07-15 MED ORDER — AMLODIPINE BESYLATE 10 MG PO TABS: 10.0000 mg | ORAL_TABLET | Freq: Every day | ORAL | 3 refills | Status: AC

## 2024-07-15 NOTE — Patient Instructions (Signed)
 Medication Instructions:  Your physician recommends that you continue on your current medications as directed. Please refer to the Current Medication list given to you today.  *If you need a refill on your cardiac medications before your next appointment, please call your pharmacy*  Lab Work: NONE   If you have labs (blood work) drawn today and your tests are completely normal, you will receive your results only by: MyChart Message (if you have MyChart) OR A paper copy in the mail If you have any lab test that is abnormal or we need to change your treatment, we will call you to review the results.  Testing/Procedures: NONE   Follow-Up: At [facility], you and your health needs are our priority.  As part of our continuing mission to provide you with exceptional heart care, our providers are all part of one team.  This team includes your primary Cardiologist (physician) and Advanced Practice Providers or APPs (Physician Assistants and Nurse Practitioners) who all work together to provide you with the care you need, when you need it.  Your next appointment:   1 year(s)  Provider:   You may see Alvan Carrier, MD or one of the following Advanced Practice Providers on your designated Care Team:   Laymon Qua, PA-C  [place], [place] Olivia [place]avy, [place]     We recommend signing up for the patient portal called MyChart.  Sign up information is provided on this After Visit Summary.  MyChart is used to connect with patients for Virtual Visits (Telemedicine).  Patients are able to view lab/test results, encounter notes, upcoming appointments, etc.  Non-urgent messages can be sent to your provider as well.   To learn more about what you can do with MyChart, go to [email or web address].   Other Instructions Thank you for choosing [facility]!
